# Patient Record
Sex: Female | Born: 1955 | Race: White | Hispanic: No | Marital: Married | Smoking: Never smoker
Health system: Southern US, Community
[De-identification: ages and names within clinical notes are randomized; demographics above are authoritative.]

## PROBLEM LIST (undated history)

## (undated) DIAGNOSIS — Z8639 Personal history of other endocrine, nutritional and metabolic disease: Secondary | ICD-10-CM

## (undated) DIAGNOSIS — I10 Essential (primary) hypertension: Secondary | ICD-10-CM

## (undated) DIAGNOSIS — Z862 Personal history of diseases of the blood and blood-forming organs and certain disorders involving the immune mechanism: Secondary | ICD-10-CM

## (undated) DIAGNOSIS — J45909 Unspecified asthma, uncomplicated: Secondary | ICD-10-CM

## (undated) DIAGNOSIS — E559 Vitamin D deficiency, unspecified: Secondary | ICD-10-CM

## (undated) DIAGNOSIS — J309 Allergic rhinitis, unspecified: Secondary | ICD-10-CM

## (undated) DIAGNOSIS — T7840XA Allergy, unspecified, initial encounter: Secondary | ICD-10-CM

## (undated) DIAGNOSIS — J019 Acute sinusitis, unspecified: Secondary | ICD-10-CM

## (undated) DIAGNOSIS — M8580 Other specified disorders of bone density and structure, unspecified site: Secondary | ICD-10-CM

## (undated) DIAGNOSIS — E785 Hyperlipidemia, unspecified: Secondary | ICD-10-CM

## (undated) DIAGNOSIS — R519 Headache, unspecified: Secondary | ICD-10-CM

## (undated) DIAGNOSIS — R3129 Other microscopic hematuria: Secondary | ICD-10-CM

## (undated) HISTORY — DX: Vitamin D deficiency, unspecified: E55.9

## (undated) HISTORY — DX: Personal history of diseases of the blood and blood-forming organs and certain disorders involving the immune mechanism: Z86.2

## (undated) HISTORY — DX: Unspecified asthma, uncomplicated: J45.909

## (undated) HISTORY — DX: Allergy, unspecified, initial encounter: T78.40XA

## (undated) HISTORY — PX: DILATION AND CURETTAGE OF UTERUS: SHX78

## (undated) HISTORY — PX: COLONOSCOPY: SHX174

## (undated) HISTORY — DX: Other specified disorders of bone density and structure, unspecified site: M85.80

## (undated) HISTORY — DX: Acute sinusitis, unspecified: J01.90

## (undated) HISTORY — DX: Other microscopic hematuria: R31.29

## (undated) HISTORY — DX: Personal history of other endocrine, nutritional and metabolic disease: Z86.39

## (undated) HISTORY — DX: Hyperlipidemia, unspecified: E78.5

## (undated) HISTORY — DX: Allergic rhinitis, unspecified: J30.9

---

## 2002-04-16 ENCOUNTER — Other Ambulatory Visit: Admission: RE | Admit: 2002-04-16 | Discharge: 2002-04-16 | Payer: Self-pay | Admitting: Obstetrics and Gynecology

## 2002-07-08 ENCOUNTER — Encounter: Payer: Self-pay | Admitting: Obstetrics and Gynecology

## 2002-07-08 ENCOUNTER — Ambulatory Visit (HOSPITAL_COMMUNITY): Admission: RE | Admit: 2002-07-08 | Discharge: 2002-07-08 | Payer: Self-pay | Admitting: Obstetrics and Gynecology

## 2002-09-25 HISTORY — PX: COLONOSCOPY W/ POLYPECTOMY: SHX1380

## 2003-04-21 ENCOUNTER — Ambulatory Visit (HOSPITAL_COMMUNITY): Admission: RE | Admit: 2003-04-21 | Discharge: 2003-04-21 | Payer: Self-pay | Admitting: Obstetrics and Gynecology

## 2003-04-21 ENCOUNTER — Encounter (INDEPENDENT_AMBULATORY_CARE_PROVIDER_SITE_OTHER): Payer: Self-pay | Admitting: *Deleted

## 2003-06-17 ENCOUNTER — Other Ambulatory Visit: Admission: RE | Admit: 2003-06-17 | Discharge: 2003-06-17 | Payer: Self-pay | Admitting: Obstetrics and Gynecology

## 2003-10-07 ENCOUNTER — Ambulatory Visit (HOSPITAL_COMMUNITY): Admission: RE | Admit: 2003-10-07 | Discharge: 2003-10-07 | Payer: Self-pay | Admitting: Obstetrics and Gynecology

## 2004-07-22 ENCOUNTER — Other Ambulatory Visit: Admission: RE | Admit: 2004-07-22 | Discharge: 2004-07-22 | Payer: Self-pay | Admitting: Obstetrics and Gynecology

## 2004-10-17 ENCOUNTER — Ambulatory Visit (HOSPITAL_COMMUNITY): Admission: RE | Admit: 2004-10-17 | Discharge: 2004-10-17 | Payer: Self-pay | Admitting: Family Medicine

## 2006-01-24 ENCOUNTER — Ambulatory Visit (HOSPITAL_COMMUNITY): Admission: RE | Admit: 2006-01-24 | Discharge: 2006-01-24 | Payer: Self-pay | Admitting: Obstetrics and Gynecology

## 2006-02-06 ENCOUNTER — Other Ambulatory Visit: Admission: RE | Admit: 2006-02-06 | Discharge: 2006-02-06 | Payer: Self-pay | Admitting: Obstetrics and Gynecology

## 2006-04-16 ENCOUNTER — Ambulatory Visit: Payer: Self-pay | Admitting: Gastroenterology

## 2006-05-14 ENCOUNTER — Ambulatory Visit: Payer: Self-pay | Admitting: Gastroenterology

## 2006-10-09 LAB — HM COLONOSCOPY

## 2007-01-02 ENCOUNTER — Encounter: Payer: Self-pay | Admitting: Family Medicine

## 2007-01-30 ENCOUNTER — Ambulatory Visit (HOSPITAL_COMMUNITY): Admission: RE | Admit: 2007-01-30 | Discharge: 2007-01-30 | Payer: Self-pay | Admitting: Obstetrics and Gynecology

## 2007-02-15 ENCOUNTER — Ambulatory Visit: Payer: Self-pay | Admitting: Internal Medicine

## 2007-03-12 ENCOUNTER — Ambulatory Visit: Payer: Self-pay | Admitting: Internal Medicine

## 2007-03-27 ENCOUNTER — Other Ambulatory Visit: Admission: RE | Admit: 2007-03-27 | Discharge: 2007-03-27 | Payer: Self-pay | Admitting: Obstetrics and Gynecology

## 2007-06-19 ENCOUNTER — Ambulatory Visit: Payer: Self-pay | Admitting: Internal Medicine

## 2007-11-25 DIAGNOSIS — J309 Allergic rhinitis, unspecified: Secondary | ICD-10-CM

## 2007-11-25 DIAGNOSIS — J45909 Unspecified asthma, uncomplicated: Secondary | ICD-10-CM

## 2007-11-25 HISTORY — DX: Allergic rhinitis, unspecified: J30.9

## 2007-11-29 ENCOUNTER — Encounter: Payer: Self-pay | Admitting: Internal Medicine

## 2008-03-18 ENCOUNTER — Encounter: Payer: Self-pay | Admitting: Internal Medicine

## 2008-05-06 ENCOUNTER — Ambulatory Visit (HOSPITAL_COMMUNITY): Admission: RE | Admit: 2008-05-06 | Discharge: 2008-05-06 | Payer: Self-pay | Admitting: Obstetrics and Gynecology

## 2008-05-13 ENCOUNTER — Encounter: Admission: RE | Admit: 2008-05-13 | Discharge: 2008-05-13 | Payer: Self-pay | Admitting: Obstetrics and Gynecology

## 2008-06-23 ENCOUNTER — Other Ambulatory Visit: Admission: RE | Admit: 2008-06-23 | Discharge: 2008-06-23 | Payer: Self-pay | Admitting: Obstetrics and Gynecology

## 2008-07-06 ENCOUNTER — Encounter: Payer: Self-pay | Admitting: Family Medicine

## 2008-08-24 ENCOUNTER — Encounter: Payer: Self-pay | Admitting: Obstetrics and Gynecology

## 2008-08-24 ENCOUNTER — Ambulatory Visit (HOSPITAL_COMMUNITY): Admission: RE | Admit: 2008-08-24 | Discharge: 2008-08-24 | Payer: Self-pay | Admitting: Obstetrics and Gynecology

## 2008-08-24 HISTORY — PX: LAPAROSCOPIC BILATERAL SALPINGECTOMY: SHX5889

## 2009-01-05 ENCOUNTER — Encounter: Admission: RE | Admit: 2009-01-05 | Discharge: 2009-01-05 | Payer: Self-pay | Admitting: Obstetrics and Gynecology

## 2009-05-06 DIAGNOSIS — Z862 Personal history of diseases of the blood and blood-forming organs and certain disorders involving the immune mechanism: Secondary | ICD-10-CM

## 2009-05-06 DIAGNOSIS — Z8639 Personal history of other endocrine, nutritional and metabolic disease: Secondary | ICD-10-CM

## 2009-07-12 ENCOUNTER — Ambulatory Visit: Payer: Self-pay | Admitting: Family Medicine

## 2009-07-12 DIAGNOSIS — E559 Vitamin D deficiency, unspecified: Secondary | ICD-10-CM | POA: Insufficient documentation

## 2009-07-12 DIAGNOSIS — E785 Hyperlipidemia, unspecified: Secondary | ICD-10-CM | POA: Insufficient documentation

## 2009-07-12 HISTORY — DX: Hyperlipidemia, unspecified: E78.5

## 2009-07-12 HISTORY — DX: Vitamin D deficiency, unspecified: E55.9

## 2009-07-14 ENCOUNTER — Encounter: Admission: RE | Admit: 2009-07-14 | Discharge: 2009-07-14 | Payer: Self-pay | Admitting: Obstetrics and Gynecology

## 2009-07-23 ENCOUNTER — Ambulatory Visit: Payer: Self-pay | Admitting: Family Medicine

## 2009-07-23 LAB — CONVERTED CEMR LAB
Basophils Absolute: 0 10*3/uL (ref 0.0–0.1)
Bilirubin Urine: NEGATIVE
Bilirubin, Direct: 0 mg/dL (ref 0.0–0.3)
CO2: 34 meq/L — ABNORMAL HIGH (ref 19–32)
Calcium: 9.3 mg/dL (ref 8.4–10.5)
Chloride: 101 meq/L (ref 96–112)
Eosinophils Relative: 6.8 % — ABNORMAL HIGH (ref 0.0–5.0)
GFR calc non Af Amer: 92.38 mL/min (ref >60)
Hemoglobin: 13 g/dL (ref 12.0–15.0)
LDL Cholesterol: 103 mg/dL — ABNORMAL HIGH (ref 0–99)
Lymphocytes Relative: 38.6 % (ref 12.0–46.0)
Lymphs Abs: 1.6 10*3/uL (ref 0.7–4.0)
MCV: 89.8 fL (ref 78.0–100.0)
Monocytes Relative: 10.2 % (ref 3.0–12.0)
Neutro Abs: 1.9 10*3/uL (ref 1.4–7.7)
Nitrite: NEGATIVE
Platelets: 297 10*3/uL (ref 150.0–400.0)
Potassium: 3.6 meq/L (ref 3.5–5.1)
Protein, U semiquant: NEGATIVE
RDW: 11.5 % (ref 11.5–14.6)
Specific Gravity, Urine: 1.015
TSH: 2.11 microintl units/mL (ref 0.35–5.50)
Total Bilirubin: 0.7 mg/dL (ref 0.3–1.2)
Total CHOL/HDL Ratio: 3
Triglycerides: 77 mg/dL (ref 0.0–149.0)
Urobilinogen, UA: 0.2
WBC: 4.2 10*3/uL — ABNORMAL LOW (ref 4.5–10.5)
pH: 6

## 2009-07-26 LAB — CONVERTED CEMR LAB: Vit D, 25-Hydroxy: 27 ng/mL — ABNORMAL LOW (ref 30–89)

## 2009-08-03 ENCOUNTER — Ambulatory Visit: Payer: Self-pay | Admitting: Family Medicine

## 2009-12-01 ENCOUNTER — Telehealth: Payer: Self-pay | Admitting: Family Medicine

## 2010-01-13 ENCOUNTER — Ambulatory Visit: Payer: Self-pay | Admitting: Internal Medicine

## 2010-01-13 ENCOUNTER — Telehealth: Payer: Self-pay | Admitting: Internal Medicine

## 2010-01-13 DIAGNOSIS — J019 Acute sinusitis, unspecified: Secondary | ICD-10-CM | POA: Insufficient documentation

## 2010-01-26 ENCOUNTER — Telehealth: Payer: Self-pay | Admitting: Family Medicine

## 2010-02-18 ENCOUNTER — Telehealth: Payer: Self-pay | Admitting: Family Medicine

## 2010-02-18 ENCOUNTER — Ambulatory Visit: Payer: Self-pay | Admitting: Family Medicine

## 2010-04-22 ENCOUNTER — Telehealth: Payer: Self-pay | Admitting: Family Medicine

## 2010-08-04 ENCOUNTER — Ambulatory Visit: Payer: Self-pay | Admitting: Family Medicine

## 2010-09-13 ENCOUNTER — Encounter
Admission: RE | Admit: 2010-09-13 | Discharge: 2010-09-13 | Payer: Self-pay | Source: Home / Self Care | Attending: Obstetrics and Gynecology | Admitting: Obstetrics and Gynecology

## 2010-10-16 ENCOUNTER — Encounter: Payer: Self-pay | Admitting: Obstetrics and Gynecology

## 2010-10-25 NOTE — Progress Notes (Signed)
Summary: Refill HCTZ and Lipitor  Phone Note Call from Patient Call back at Home Phone [phone]   Caller: Patient Call For: Evelena Peat MD Summary of Call: pt  needs new rx [facility]  [facility] liptor 20mg   and hctz 25 mg today please pt is going out of town. Initial call taken by: Heron Sabins,  Jan 26, 2010 10:55 AM  Follow-up for Phone Call        OK to refill both for 6 months. Follow-up by: Evelena Peat MD,  Jan 26, 2010 1:31 PM  Additional Follow-up for Phone Call Additional follow up Details #1::        Rs sent, pt informed,  Additional Follow-up by: Sid Falcon LPN,  Jan 27, 980 1:36 PM    Prescriptions: HYDROCHLOROTHIAZIDE 25 MG  TABS (HYDROCHLOROTHIAZIDE) as needed  #30 x 6   Entered by:   Sid Falcon LPN   Authorized by:   Evelena Peat MD   Signed by:   Sid Falcon LPN on 19/14/7829   Method used:   [facility]  [place] [address]       [address]       [place], [place]  [address]       Ph: [phone] or [phone]       Fax: [phone]   RxID:   [id number] LIPITOR 20 MG TABS (ATORVASTATIN CALCIUM) once daily  #30 x 6   Entered by:   Sid Falcon LPN   Authorized by:   Evelena Peat MD   Signed by:   Sid Falcon LPN on 56/43/3295   Method used:   [facility]        [facility]  [place] [address] [facility]       [address]       [place], [place]  [address]       Ph: [phone] or [phone]       Fax: [phone]   RxID:   [id number]

## 2010-10-25 NOTE — Progress Notes (Signed)
Summary: Pt req antibiotic for cough, stuffy nose, [facility]throat [facility]   Phone Note Call from Patient Call back at [phone]   Caller: spouse- Onalee Hua Summary of Call: Pts husband called and said that his wife is experiencing the following symptoms : coughing, stuffy nose, drainage in throat, sorethroat, phelm. Pt was in a exposed to a person that had been dx with tonsilitis and bronchitus. Pt is req an antibiotic to be [facility] [place]. Pt leaving to go out of town and will not be back for 2 wks.  Initial call taken by: Lucy Antigua,  April 22, 2010 1:41 PM  Follow-up for Phone Call        OK to call in Zithromax (Zpack) use as directed. Follow-up by: Evelena Peat MD,  April 22, 2010 3:56 PM  Additional Follow-up for Phone Call Additional follow up Details #1::        Husband informed, he states she has taken Z-pack before with no adverse reaction Additional Follow-up by: Sid Falcon LPN,  April 22, 2010 4:36 PM    New/Updated Medications: ZITHROMAX 1 GM PACK (AZITHROMYCIN)  ZITHROMAX 1 GM PACK (AZITHROMYCIN) as directed Prescriptions: ZITHROMAX 1 GM PACK (AZITHROMYCIN) as directed  #6 x 0   Entered by:   Sid Falcon LPN   Authorized by:   Evelena Peat MD   Signed by:   Sid Falcon LPN on 69/67/8938   Method used:   [facility]        [facility]  [place] [address] [facility]       [address]       [place], [place]  [address]       Ph: [phone] or [phone]       Fax: [phone]   RxID:   [id number]

## 2010-10-25 NOTE — Progress Notes (Signed)
Summary: Pt is req to be tested for lyme disease - 2 deer tick bites  Phone Note Call from Patient Call back at Home Phone [phone]   Caller: Patient Summary of Call: Pt was biten by 2 deer tick in [place] on May 2 and May 6, pt has a hard knot where ticks were attached and now has sore throat and headaches. Pt does not have any joint pain or rash.  Pt is req to get tested for lyme disease.   Initial call taken by: Lucy Antigua,  Feb 18, 2010 2:52 PM  Follow-up for Phone Call        REcommend office follow up and will assess and can draw labs then. Follow-up by: Evelena Peat MD,  Feb 18, 2010 3:16 PM  Additional Follow-up for Phone Call Additional follow up Details #1::        I called pt and sch her for ov for 02/18/10 at 4:15, as noted above.  Additional Follow-up by: Lucy Antigua,  Feb 18, 2010 4:29 PM

## 2010-10-25 NOTE — Progress Notes (Signed)
Summary: sinus  Phone Note Call from Patient   Caller: Spouse Call For: Evelena Peat MD Summary of Call: Husband called to see if pt could be seen today for sinus symptoms.  Attempted to call back but no answer and no voice mail. [phone] Initial call taken by: Lynann Beaver CMA,  January 13, 2010 8:59 AM  Follow-up for Phone Call        seen in office Follow-up by: Birdie Sons MD,  January 13, 2010 12:35 PM

## 2010-10-25 NOTE — Assessment & Plan Note (Signed)
Summary: ?sinus inf/congestion/cjr   Vital Signs:  Patient profile:   55 year old female Menstrual status:  postmenopausal Temp:     98.4 degrees F oral Pulse rate:   62 / minute Pulse rhythm:   regular Resp:     12 per minute BP sitting:   128 / 86  (left arm) Cuff size:   regular  Vitals Entered By: Gladis Riffle, RN (January 13, 2010 10:38 AM) CC: c/o sinuses with green drainage, now has constant headache Is Patient Diabetic? No   CC:  c/o sinuses with green drainage and now has constant headache.  History of Present Illness: URI sxs---ongoing for weeks past two days developed right sided headache associated nasal congestion with mucopurulent mucous no fevers. chills  All other systems reviewed and were negative   Preventive Screening-Counseling & Management  Alcohol-Tobacco     Smoking Status: never  Current Problems (verified): 1)  Routine General Medical Exam@[facility]  (ICD-V70.0) 2)  Vitamin D Deficiency  (ICD-268.9) 3)  Hyperlipidemia  (ICD-272.4) 4)  Hypoglycemia, Hx of  (ICD-V12.2) 5)  Asthma  (ICD-493.90) 6)  Allergic Rhinitis  (ICD-477.9)  Current Medications (verified): 1)  Nasacort Aq 55 Mcg/act  Aers (Triamcinolone Acetonide(Nasal)) .... Use As Directed 2)  Symbicort 80-4.5 Mcg/act  Aero (Budesonide-Formoterol Fumarate) .... Inhale 2 Puffs Two Times A Day 3)  Singulair 10 Mg  Tabs (Montelukast Sodium) .... Take 1 Tablet By Mouth Once A Day 4)  Eq Chlortabs 4 Mg  Tabs (Chlorpheniramine Maleate) .... As Needed 5)  Proventil Hfa 108 (90 Base) Mcg/act  Aers (Albuterol Sulfate) .... Inhale 2 Puffs Every 4 Hours As Needed 6)  Hydrochlorothiazide 25 Mg  Tabs (Hydrochlorothiazide) .... As Needed 7)  Lipitor 20 Mg Tabs (Atorvastatin Calcium) .... Once Daily 8)  Vitamin D (Ergocalciferol) 50000 Unit Caps (Ergocalciferol) .... Once Weekly 9)  Vitamin D3 1000 Unit Tabs (Cholecalciferol) .... 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   Head:  normocephalic and atraumatic.   Eyes:  pupils equal and pupils round.   Ears:  R ear normal and L ear normal.   Nose:  nasal dischargemucosal pallor.   Neck:  No deformities, masses, or tenderness noted.   Impression & Recommendations:  Problem # 1:  SINUSITIS- ACUTE-NOS (ICD-461.9) discussed emperic ABX  side efects discussed Her updated medication list for this problem includes:    Nasacort Aq 55 Mcg/act Aers (Triamcinolone acetonide(nasal)) ..... Use as directed    Doxycycline Hyclate 100 Mg Caps (Doxycycline hyclate) .Marland Kitchen... Take 1 tab twice a day  Complete Medication List: 1)  Nasacort Aq 55 Mcg/act Aers (Triamcinolone acetonide(nasal)) .... Use as directed 2)  Symbicort 80-4.5 Mcg/act Aero (Budesonide-formoterol fumarate) .... Inhale 2 puffs two times a day 3)  Singulair 10 Mg Tabs (Montelukast sodium) .... Take 1 tablet by mouth once a day 4)  Eq Chlortabs 4 Mg Tabs (Chlorpheniramine maleate) .... As needed 5)  Proventil Hfa 108 (90 Base) Mcg/act  Aers (Albuterol sulfate) .... Inhale 2 puffs every 4 hours as needed 6)  Hydrochlorothiazide 25 Mg Tabs (Hydrochlorothiazide) .... As needed 7)  Lipitor 20 Mg Tabs (Atorvastatin calcium) .... Once daily 8)  Vitamin D (ergocalciferol) 50000 Unit Caps (Ergocalciferol) .... Once weekly 9)  Vitamin D3 1000  Unit Tabs (Cholecalciferol) .... Once daily 10)  Doxycycline Hyclate 100 Mg Caps (Doxycycline hyclate) .... Take 1 tab twice a day Prescriptions: DOXYCYCLINE HYCLATE 100 MG CAPS (DOXYCYCLINE HYCLATE) Take 1 tab twice a day  #20 x 0   Entered and Authorized by:   Birdie Sons MD   Signed by:   Birdie Sons MD on 01/13/2010   Method used:   [facility]        [facility]  [place] [address] [facility]       [address]       [place], [place]  [address]       Ph: [phone] or [phone]       Fax: [phone]   RxID:   [id number]

## 2010-10-25 NOTE — Assessment & Plan Note (Signed)
Summary: sinuses//ccm   Vital Signs:  Patient profile:   55 year old female Menstrual status:  postmenopausal Weight:      143 pounds Temp:     99.0 degrees F oral BP sitting:   140 / 88  (left arm) Cuff size:   regular  Vitals Entered By: Sid Falcon LPN (August 04, 2010 9:59 AM)  History of Present Illness: Three-week history of intermittent symptoms of nasal congestion and facial pain. Cough productive of green sputum and occasional blood-tinged sputum. No pleuritic pain, weight loss, or dyspnea. Mucinex D without relief. Similar symptoms in past with acute sinusitis. Nonsmoker  History of asthma generally well controlled. Needs refills medications. Also his history of perennial allergies. She takes Nasacort and Singulair regularly.   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    Physical Exam  General:  Well-developed,well-nourished,in no acute distress; alert,appropriate and cooperative throughout examination Eyes:  No corneal or conjunctival inflammation noted. EOMI. Perrla. Funduscopic exam benign, without  hemorrhages, exudates or papilledema. Vision grossly normal. Ears:  External ear exam shows no significant lesions or deformities.  Otoscopic examination reveals clear canals, tympanic membranes are intact bilaterally without bulging, retraction, inflammation or discharge. Hearing is grossly normal bilaterally. Mouth:  Oral mucosa and oropharynx without lesions or exudates.  Teeth in good repair. Neck:  No deformities, masses, or tenderness noted. Lungs:  Normal respiratory effort, chest expands symmetrically. Lungs are clear to auscultation, no crackles or wheezes. Heart:  Normal rate and regular rhythm. S1 and S2 normal without gallop, murmur, click, rub or other extra sounds. Extremities:  No clubbing, cyanosis, edema, or deformity noted with normal full range of motion of all joints.   Skin:  no rashes.     Impression & Recommendations:  Problem # 1:  SINUSITIS- ACUTE-NOS (ICD-461.9)  The following medications were removed from the medication list:    Doxycycline Hyclate 100 Mg Caps (Doxycycline hyclate) ..... One by mouth two times a day for 14 days Her updated medication list for this problem includes:    Nasacort Aq 55 Mcg/act Aers (Triamcinolone acetonide(nasal)) ..... Use as directed    Azithromycin 250 Mg Tabs (Azithromycin) .Marland Kitchen... 2 by mouth today then one by mouth once daily for 4 days  Problem # 2:  ASTHMA (ICD-493.90) flu vaccine recommended.  Multiple meds refilled. Her updated medication list for this problem includes:    Symbicort 80-4.5 Mcg/act Aero (Budesonide-formoterol fumarate) ..... Inhale 2 puffs two times a day    Singulair 10 Mg Tabs (Montelukast sodium) .Marland Kitchen... Take 1 tablet by mouth once a day    Proventil Hfa 108 (90 Base) Mcg/act Aers (Albuterol sulfate) ..... Inhale 2 puffs every 4 hours as needed  Problem # 3:  ALLERGIC RHINITIS (ICD-477.9)  Her updated medication list for this problem includes:    Nasacort  Aq 55 Mcg/act Aers (Triamcinolone acetonide(nasal))  ..... Use as directed    Eq Chlortabs 4 Mg Tabs (Chlorpheniramine maleate) .Marland Kitchen... As needed  Complete Medication List: 1)  Nasacort Aq 55 Mcg/act Aers (Triamcinolone acetonide(nasal)) .... Use as directed 2)  Symbicort 80-4.5 Mcg/act Aero (Budesonide-formoterol fumarate) .... Inhale 2 puffs two times a day 3)  Singulair 10 Mg Tabs (Montelukast sodium) .... Take 1 tablet by mouth once a day 4)  Eq Chlortabs 4 Mg Tabs (Chlorpheniramine maleate) .... As needed 5)  Proventil Hfa 108 (90 Base) Mcg/act Aers (Albuterol sulfate) .... Inhale 2 puffs every 4 hours as needed 6)  Hydrochlorothiazide 25 Mg Tabs (Hydrochlorothiazide) .... As needed 7)  Lipitor 20 Mg Tabs (Atorvastatin calcium) .... Once daily 8)  Vitamin D (ergocalciferol) 50000 Unit Caps (Ergocalciferol) .... Once weekly 9)  Vitamin D3 1000 Unit Tabs (Cholecalciferol) .... Once daily 10)  Azithromycin 250 Mg Tabs (Azithromycin) .... 2 by mouth today then one by mouth once daily for 4 days  Other Orders: Admin 1st Vaccine (65784) Flu Vaccine 100yrs + (69629)  Patient Instructions: 1)  Consider schedule complete physical examination Prescriptions: AZITHROMYCIN 250 MG TABS (AZITHROMYCIN) 2 by mouth today then one by mouth once daily for 4 days  #6 x 0   Entered and Authorized by:   Evelena Peat MD   Signed by:   Evelena Peat MD on 08/04/2010   Method used:   [facility]        [facility]  [place] [address]       [address]       [place], [place]  [address]       Ph: [phone] or [phone]       Fax: [phone]   RxID:   [id number] VITAMIN D (ERGOCALCIFEROL) 50000 UNIT CAPS (ERGOCALCIFEROL) once weekly  #4 x 11   Entered and Authorized by:   Evelena Peat MD   Signed by:   Evelena Peat MD on 08/04/2010   Method used:   [facility]        [facility]  [place] [address] [facility]       [address]       [place], [place]  [address]       Ph: [phone] or [phone]       Fax: [phone]   RxID:    [id number] PROVENTIL HFA 108 (90 BASE) MCG/ACT  AERS (ALBUTEROL SULFATE) inhale 2 puffs every 4 hours as needed  #1 x 2   Entered and Authorized by:   Evelena Peat MD   Signed by:   Evelena Peat MD on 08/04/2010   Method used:   [facility]        [facility]  [place] [address] [facility]       [address]       [place], [place]  [address]       Ph: [phone] or [phone]       Fax: [phone]   RxID:   [id number] SINGULAIR 10 MG  TABS (MONTELUKAST SODIUM) Take 1 tablet by mouth once a day  #30 x 11   Entered and Authorized by:   Evelena Peat MD   Signed by:   Evelena Peat MD on 08/04/2010   Method used:   [facility]        [facility]  [place] [phone][facility] ([facility]ail)       [address]       [place],  [place]  [address]       Ph: [phone] or [phone]       Fax: [phone]   RxID:   [id number] SYMBICORT 80-4.5 MCG/ACT  AERO (BUDESONIDE-FORMOTEROL FUMARATE) Inhale 2 puffs two times a day  #1 x 11   Entered and Authorized by:   Evelena Peat MD   Signed by:   Evelena Peat MD on 08/04/2010   Method used:   [facility]        [facility]  [place] [address]       [address]       [place], [place]  [address]       Ph: [phone] or [phone]       Fax: [phone]   RxID:   [id number] NASACORT AQ 55 MCG/ACT  AERS (TRIAMCINOLONE ACETONIDE(NASAL)) Use as directed  #1 x 11   Entered and Authorized by:   Evelena Peat MD   Signed by:   Evelena Peat MD on 08/04/2010   Method used:   [facility]        [facility]  [place] [address] [facility]       [address]       [place], [place]  [address]       Ph: [phone] or [phone]       Fax: [phone]   RxID:   [id number]    Orders Added: 1)  Admin 1st Vaccine [90471] 2)  Flu Vaccine 36yrs + [35329] 3)  Est. Patient Level IV [92426]   Flu Vaccine Consent Questions     Do you have a history of severe allergic reactions to this vaccine? no    Any prior history of  allergic reactions to egg and/or gelatin? no    Do you have a sensitivity to the preservative Thimersol? no    Do you have a past history of Guillan-Barre Syndrome? no    Do you currently have an acute febrile illness? no    Have you ever had a severe reaction to latex? no    Vaccine information given and explained to patient? yes    Are you currently pregnant? no    Lot Number:[id number]   Exp Date:03/25/2011   Site Given  Left Deltoid IM         .[email or web address]

## 2010-10-25 NOTE — Progress Notes (Signed)
Summary: trig 521  Phone Note Call from Patient Call back at Home Phone [phone] Call back at [facility]   Summary of Call: At husband's employer health check triglycerides was 521, did have coffee with half and half.  At [facility] she was told her labs were great on Lipitor 20mg .  She is very concerned & wants to know if you need to see her or have labs or what?  Initial call taken by: Rudy Jew, RN,  December 01, 2009 3:34 PM  Follow-up for Phone Call        spoke with pt.  Pt will schedule f/u when she is back in town to consider repeat fasting labs.  No prior hx of hypertriglyceridemia. Follow-up by: Evelena Peat MD,  December 01, 2009 5:16 PM

## 2010-10-25 NOTE — Assessment & Plan Note (Signed)
Summary: tick bites/check for lyme disease per Dr. Charlton Amor   Vital Signs:  Patient profile:   55 year old female Menstrual status:  postmenopausal Temp:     98.5 degrees F oral BP sitting:   120 / 78  (left arm) Cuff size:   regular  Vitals Entered By: Sid Falcon LPN (Feb 18, 2010 4:26 PM) CC: Tick bite?   History of Present Illness: Patient is seen with a couple of recent tick bites.  She was up in [place] and on May 2 removed a tick from left rib cage area. Then approximately 4 days later removed a second tic from under her left axillary region. These were small probable deer ticks. She has not had any rash, headache, stiff neck, malaise, arthralgias, or any other new symptoms.  Allergies: 1)  Biaxin 2)  Erythromycin  Past History:  Past Medical History: Last updated: 08/03/2009  Allergic rhinitis Asthma Frequent headaches Hyperlipidemia Hx low Vit D PMH reviewed for relevance  Review of Systems      See HPI  Physical Exam  General:  Well-developed,well-nourished,in no acute distress; alert,appropriate and cooperative throughout examination Head:  Normocephalic and atraumatic without obvious abnormalities. No apparent alopecia or balding. Lungs:  Normal respiratory effort, chest expands symmetrically. Lungs are clear to auscultation, no crackles or wheezes. Heart:  normal rate and regular rhythm.   Skin:  patient has a couple of small punctate erythematous papules one below the left axilla other left rib cage area anteriorly. No signs of erythema chronicum migrans.   Impression & Recommendations:  Problem # 1:  TICK BITE (ICD-E906.4) Assessment New Endemic area but no ECM or other worrisome symptoms.  At this point we have discussed limitations of antibody testing acutely and no antibiotics at this point.  Reviewed signs and symptoms of Lyme disease.  Only start antibiotic over weekend if new rash, fever, etc.  Complete Medication List: 1)  Nasacort  Aq 55 Mcg/act Aers (Triamcinolone acetonide(nasal)) .... Use as directed 2)  Symbicort 80-4.5 Mcg/act Aero (Budesonide-formoterol fumarate) .... Inhale 2 puffs two times a day 3)  Singulair 10 Mg Tabs (Montelukast sodium) .... Take 1 tablet by mouth once a day 4)  Eq Chlortabs 4 Mg Tabs (Chlorpheniramine maleate) .... As needed 5)  Proventil Hfa 108 (90 Base) Mcg/act Aers (Albuterol sulfate) .... Inhale 2 puffs every 4 hours as needed 6)  Hydrochlorothiazide 25 Mg Tabs (Hydrochlorothiazide) .... As needed 7)  Lipitor 20 Mg Tabs (Atorvastatin calcium) .... Once daily 8)  Vitamin D (ergocalciferol) 50000 Unit Caps (Ergocalciferol) .... Once weekly 9)  Vitamin D3 1000 Unit Tabs (Cholecalciferol) .... Once daily 10)  Doxycycline Hyclate 100 Mg Caps (Doxycycline hyclate) .... One by mouth two times a day for 14 days  Patient Instructions: 1)  Start antibiotic if you develop rash (as discussed), unexplained fever,  headaches, or any joint pains. Prescriptions: DOXYCYCLINE HYCLATE 100 MG CAPS (DOXYCYCLINE HYCLATE) one by mouth two times a day for 14 days  #28 x 0   Entered and Authorized by:   Evelena Peat MD   Signed by:   Evelena Peat MD on 02/18/2010   Method used:   Print then Give to Patient   RxID:   [id number]

## 2010-10-27 ENCOUNTER — Other Ambulatory Visit: Payer: Self-pay | Admitting: *Deleted

## 2010-10-27 MED ORDER — MONTELUKAST SODIUM 10 MG PO TABS: 10.0000 mg | ORAL_TABLET | Freq: Every day | ORAL | Status: DC

## 2010-11-21 ENCOUNTER — Ambulatory Visit (INDEPENDENT_AMBULATORY_CARE_PROVIDER_SITE_OTHER): Payer: BC Managed Care – PPO | Admitting: Family Medicine

## 2010-11-21 ENCOUNTER — Encounter: Payer: Self-pay | Admitting: Family Medicine

## 2010-11-21 VITALS — BP 150/82 | Temp 98.8°F | Ht 63.0 in | Wt 143.0 lb

## 2010-11-21 DIAGNOSIS — J019 Acute sinusitis, unspecified: Secondary | ICD-10-CM

## 2010-11-21 MED ORDER — AMOXICILLIN-POT CLAVULANATE 875-125 MG PO TABS: 1.0000 | ORAL_TABLET | Freq: Two times a day (BID) | ORAL | Status: AC

## 2010-11-21 NOTE — Progress Notes (Signed)
Patient seen with also noted sinus congestive symptoms since January. Symptoms wax and wane. Frequent frontal sinus headaches and some yellowish nasal discharge intermittently. Malaise off and on. Denies any fever. Saline irrigation and antihistamines without improvement. Rare dry cough. No dyspnea. Intermittent mild sore throat. Patient is a nonsmoker. She has history of intolerance to clarithromycin and erythromycin. No penicillin allergy.   Past medical history significant for vitamin D deficiency, mild hyperlipidemia, and mild intermittent asthma   Review of systems -as per history of present illness.  Physical examination patient is alert nontoxic in appearance  nasal exam reveals some erythematous mucosa with clear mucus.  Frontal sinuses are tender to palpation TMs are normal Neck is supple no adenopathy Oropharynx is moist and clear Chest clear auscultation Heart regular rhythm and rate without murmur   probable acute sinusitis given duration of symptoms. Start Augmentin 875 mg twice a day for 10 days and reviewed possible side effects. Followup when necessary

## 2010-11-21 NOTE — Patient Instructions (Signed)

## 2010-12-02 ENCOUNTER — Telehealth: Payer: Self-pay | Admitting: Family Medicine

## 2010-12-02 NOTE — Telephone Encounter (Signed)
Pt informed on personally identified VM 

## 2010-12-02 NOTE — Telephone Encounter (Signed)
Residual cough is common.  If no fever, I would rec observe for now .

## 2010-12-02 NOTE — Telephone Encounter (Signed)
Please advise 

## 2010-12-02 NOTE — Telephone Encounter (Signed)
Triage vm------was seen with a sinus infection. Finished her 10 day abx. She is still coughing up mucus, with congestion. Please advise of something to take? Will be travelling for the next 8 weeks, only being here 1 day a week.   Call her cell- [phone].

## 2011-01-20 ENCOUNTER — Ambulatory Visit (INDEPENDENT_AMBULATORY_CARE_PROVIDER_SITE_OTHER): Payer: BC Managed Care – PPO | Admitting: Family Medicine

## 2011-01-20 ENCOUNTER — Encounter: Payer: Self-pay | Admitting: Family Medicine

## 2011-01-20 VITALS — BP 100/62 | Temp 98.6°F | Wt 143.0 lb

## 2011-01-20 DIAGNOSIS — M79603 Pain in arm, unspecified: Secondary | ICD-10-CM

## 2011-01-20 DIAGNOSIS — M79609 Pain in unspecified limb: Secondary | ICD-10-CM

## 2011-01-20 MED ORDER — NAPROXEN 500 MG PO TABS: 500.0000 mg | ORAL_TABLET | Freq: Two times a day (BID) | ORAL | Status: DC

## 2011-01-20 NOTE — Progress Notes (Signed)
  Subjective:    Patient ID: Bridget Flynn, female    DOB: 02-28-1956, 55 y.o.   MRN: [id number]  HPI Patient seen with right upper extremity pain and pain around the right trapezius and right shoulder region. Onset about one month ago and progressive. Especially bothersome last light. No specific injury. Pain is sharp and worse with movements such as abduction and reaching overhead. She had some tingling sensation in her hand last night and pain that extended from the right lower neck region all the way down to the hand at times. Took some naproxen from leftover prescription which seemed to help. Pain is much better today. No weakness noted. No history of cervical disc problem. No history of rotator cuff problem.   Review of Systems  Cardiovascular: Negative for chest pain.  Musculoskeletal: Negative for myalgias, back pain and joint swelling.  Skin: Negative for rash.  Neurological: Negative for weakness.       Objective:   Physical Exam  Constitutional: She is oriented to person, place, and time. She appears well-developed and well-nourished.  Cardiovascular: Normal rate, regular rhythm and normal heart sounds.   No murmur heard. Pulmonary/Chest: Effort normal and breath sounds normal. No respiratory distress. She has no wheezes. She has no rales.  Musculoskeletal:       Neck reveals full range of motion. She has full range of motion right shoulder passively but does have pain with internal rotation and abduction. No specific point tenderness of the right trapezius. Strength testing of shoulder difficult secondary to pain. No bicipital tenderness. No a.c. joint tenderness.  Neurological: She is alert and oriented to person, place, and time.       Full-strength upper extremity. Normal sensory to touch. 1+ reflex brachioradialis, triceps, and biceps bilaterally          Assessment & Plan:  Right upper extremity and shoulder pain. Differential is rotator cuff versus cervical disc and  suspect the latter. Naproxen 500 mg twice a day with food and touch base 2 weeks if not improving.

## 2011-01-20 NOTE — Patient Instructions (Signed)
Follow up promptly for any progressive weakness, numbness, or pain. Take Naprosyn with food.

## 2011-01-22 ENCOUNTER — Encounter: Payer: Self-pay | Admitting: Family Medicine
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
## 2011-02-07 NOTE — Assessment & Plan Note (Signed)
 [facility]                             PULMONARY OFFICE NOTE   ITALI, MCKENDRY                          MRN:          [id number]  DATE:06/19/2007                            DOB:          1956-03-22    PROBLEM LIST:  1. Allergic rhinitis.  2. Asthma.   HISTORY:  She continues to feel very well-controlled.  She had noted  some mild upper chest congestion as fall weather came in, but it does  not seem to be disturbing her at all.  She is aware of a left anterior  cervical node which has been present for a couple of weeks.  Pending a  physical exam, she has had blood work at [employer].  No significant nasal discharge.  No fever or sore throat.   MEDICATIONS:  1. Nasacort AQ.  2. Symbicort 80/4.5 two puffs b.i.d.  3. Singulair 10 mg.  4. Proventil HFA inhaler.   ALLERGIES:  BIAXIN AND ERYTHROMYCIN for GI upset, but can tolerate Z-  pack p.r.n.   OBJECTIVE:  VITAL SIGNS:  Weight 132 pounds.  BP 122/80, pulse 68, room  air saturation 98%.  HEENT:  I do feel a left anterior cervical node that feels rubbery.  Minor sniffing.  Pharynx seems clear.  CHEST:  Clear.  HEART:  Normal heart sounds.   IMPRESSION:  Asthma well-controlled with Symbicort.  Cervical node is  probably viral triggered, but it needs to be watched.  No changes needed  for now.   PLAN:  Return in 6 months, then probably p.r.n.     Clinton D. Maple Hudson, MD, Tonny Bollman, FACP  Electronically Signed    CDY/MedQ  DD: 06/19/2007  DT: 06/20/2007  Job #: [id number]

## 2011-02-07 NOTE — Op Note (Signed)
Bridget Flynn, Bridget Flynn                   ACCOUNT NO.:  [id number]   MEDICAL RECORD NO.:  [id number]          PATIENT TYPE:  AMB   LOCATION:  [facility]                           FACILITY:  [facility]   PHYSICIAN:  Cynthia P. Romine, M.D.DATE OF BIRTH:  06/07/1956   DATE OF PROCEDURE:  08/24/2008  DATE OF DISCHARGE:                               OPERATIVE REPORT   PREOPERATIVE DIAGNOSIS:  A 3-cm complex right ovarian cyst.   POSTOPERATIVE DIAGNOSIS:  Normal tubes and ovaries, pathology pending.   PROCEDURE:  Laparoscopic bilateral salpingo-oophorectomy.   SURGEON:  Cynthia P. Romine, MD   ANESTHESIA:  General endotracheal.   ESTIMATED BLOOD LOSS:  Minimal.   COMPLICATIONS:  None.   FINDINGS:  On laparoscopy, the right ovarian cyst was not present.  The  right ovary looked normal as did the right tube.  The uterus was small  and mobile and a normal size and shape.  The left tube and ovary  likewise were normal.  A discussion had been had with the patient prior  to surgery that if the right ovarian cyst had resolved, she still wanted  to have a bilateral salpingo-oophorectomy because of her family history  of ovarian cancer and her concern over getting cancer of the ovary  herself.   PROCEDURE:  The patient was taken to the operating room, and after  induction of adequate general endotracheal anesthesia was placed in the  dorsal lithotomy position and prepped and draped in usual fashion.  An  Acorn uterine manipulator was placed and the bladder was drained with a  red rubber catheter.  The legs were then put in a low-lithotomy  position.  Attention was turned to the umbilicus.  Below the umbilicus  was infiltrated with 0.25% plain Marcaine and a vertical incision was  made just below the umbilicus.  It was carried down to the fascia with  blunt dissection.  The fascia was grasped with Kochers, opened with Mayo  scissors, and a pursestring suture of 0 Vicryl was placed around the  fascial opening.   The peritoneum was entered atraumatically.  A Hasson  cannula was then placed.  Pneumoperitoneum was created using the  automatic insufflator with CO2.  Two 5-mm trocars were then inserted  into the right and left lower quadrants after transilluminating to avoid  bleeders and were placed under direct visualization.  The pelvis was  inspected with the above findings.  The bipolar and then tripolar  instruments were then used to coagulate and cut the pedicles containing  the utero-ovarian ligament and the tube until the infundibulopelvic  ligament was fairly well skeletonized on each side.  The ureters were  clearly identified and were well away from the pedicles.  Two Endoloops  of 0 Vicryl were placed around the infundibulopelvic ligaments on each  side and then the specimens were removed with scissors.  Each specimen  was brought out through the operative scope and sent to pathology  separately.  The pedicles were irrigated and were hemostatic.  The  ureters were again identified peristalsing bilaterally.  The 5-mm ports  were removed under direct visualization.  The scope was withdrawn.  Pneumoperitoneum was allowed to escape and the trocar sleeve was  removed.  The fascia was closed by tying the pursestring suture that had  been previously placed.  The skin at the umbilicus was closed using a  4-0 Vicryl Rapide subcuticular stitch and then reinforcing it with  Dermabond.  The two 5-mm incisions were closed with Dermabond.  The  instruments were removed from the vagina and the procedure was  terminated.  The patient tolerated well, went in satisfactory condition  to postanesthesia recovery.      Cynthia P. Romine, M.D.  Electronically Signed     CPR/MEDQ  D:  08/24/2008  T:  08/25/2008  Job:  [id number]

## 2011-02-10 NOTE — Op Note (Signed)
   NAME:  Bridget Flynn, Bridget Flynn                             ACCOUNT NO.:  [id number]   MEDICAL RECORD NO.:  [id number]                   PATIENT TYPE:  AMB   LOCATION:  [facility]                                  FACILITY:  [facility]   PHYSICIAN:  Cynthia P. Romine, M.D.             DATE OF BIRTH:  1955-10-07   DATE OF PROCEDURE:  04/21/2003  DATE OF DISCHARGE:                                 OPERATIVE REPORT   PREOPERATIVE DIAGNOSES:  1. Perimenopausal.  2. Dysfunctional uterine bleeding.  3. Known endometrial polyps.   POSTOPERATIVE DIAGNOSES:  1. Perimenopausal.  2. Dysfunctional uterine bleeding.  3. Known endometrial polyps.  4. Pathology pending.   PROCEDURES:  1. Hysteroscopic resection of endometrial polyps.  2. Dilatation and curettage.   SURGEON:  Cynthia P. Romine, M.D.   ANESTHESIA:  General by LMA.   ESTIMATED BLOOD LOSS:  Minimal.   SORBITOL DEFICIT:  120 mL.   COMPLICATIONS:  None.   PROCEDURE:  The patient was taken to the operating room and after the  induction of adequate general anesthesia by LMA was placed in the dorsal  lithotomy position and prepped and draped in the usual fashion.  The bladder  was drained with a red rubber catheter.  A posterior weighted and anterior  Simms retractor was placed and the cervix was grasped on the anterior lip  with a single-tooth tenaculum.  The uterus sounded to 9 cm.  The cervix was  dilated to a #27 Shawnie Pons.  The diagnostic hysteroscope was introduced.  Hysteroscopy was carried out with visualization of two rather large  endometrial polyps.  The diagnostic scope was removed.  The cervix was  dilated to a #31 Pratt.  The operative hysteroscope was introduced, sorbitol  was used as a distention medium.  Cautery was used to resect these polyps  using a single wire loop.  Photographic documentation was taken before and  after the resection.  Upon  completion of the resection the hysteroscope was withdrawn, sharp curettage  was carried  out, and the specimen was also sent to pathology with the  polyps.  The instruments were removed from the vagina and the procedure was  terminated.  The patient tolerated it well, went in satisfactory condition  to postanesthesia recovery.                                               Cynthia P. Romine, M.D.    CPR/MEDQ  D:  04/21/2003  T:  04/21/2003  Job:  [id number]

## 2011-02-17 ENCOUNTER — Encounter: Payer: Self-pay | Admitting: Internal Medicine

## 2011-02-17 ENCOUNTER — Ambulatory Visit (INDEPENDENT_AMBULATORY_CARE_PROVIDER_SITE_OTHER): Payer: BC Managed Care – PPO | Admitting: Internal Medicine

## 2011-02-17 DIAGNOSIS — J069 Acute upper respiratory infection, unspecified: Secondary | ICD-10-CM

## 2011-02-17 DIAGNOSIS — J45909 Unspecified asthma, uncomplicated: Secondary | ICD-10-CM

## 2011-02-17 DIAGNOSIS — J309 Allergic rhinitis, unspecified: Secondary | ICD-10-CM

## 2011-02-17 MED ORDER — DOXYCYCLINE HYCLATE 50 MG PO CAPS: 50.0000 mg | ORAL_CAPSULE | Freq: Two times a day (BID) | ORAL | Status: AC

## 2011-02-17 MED ORDER — DOXYCYCLINE HYCLATE 50 MG PO CAPS: 50.0000 mg | ORAL_CAPSULE | Freq: Two times a day (BID) | ORAL | Status: DC

## 2011-02-17 NOTE — Patient Instructions (Signed)
Acute bronchitis symptoms for less than 10 days are generally not helped by antibiotics.  Take over-the-counter expectorants and cough medications such as  Mucinex DM.  Call if there is no improvement in 5 to 7 days or if he developed worsening cough, fever, or new symptoms, such as shortness of breath or chest pain.    

## 2011-02-17 NOTE — Progress Notes (Signed)
  Subjective:    Patient ID: Bridget Flynn, female    DOB: November 21, 1955, 55 y.o.   MRN: [id number]  HPI  55 year old patient who has a history of asthma and allergic rhinitis. She presents with a five-day history of increasing hoarseness and for the past 3 days productive cough. She has become more hoarse with head and chest congestion and cough productive of green sputum. She has had low-grade fever only denies any chest pain shortness of breath or active wheezing. She is on number of the maintenance medication for allergic rhinitis and asthma which he continues to take. She has added Mucinex to her regimen;  she was concerned about her purulent sputum production and need for antibiotic use    Review of Systems  Constitutional: Negative.   HENT: Positive for congestion. Negative for hearing loss, sore throat, rhinorrhea, dental problem, sinus pressure and tinnitus.   Eyes: Negative for pain, discharge and visual disturbance.  Respiratory: Positive for cough. Negative for shortness of breath.   Cardiovascular: Negative for chest pain, palpitations and leg swelling.  Gastrointestinal: Negative for nausea, vomiting, abdominal pain, diarrhea, constipation, blood in stool and abdominal distention.  Genitourinary: Negative for dysuria, urgency, frequency, hematuria, flank pain, vaginal bleeding, vaginal discharge, difficulty urinating, vaginal pain and pelvic pain.  Musculoskeletal: Negative for joint swelling, arthralgias and gait problem.  Skin: Negative for rash.  Neurological: Negative for dizziness, syncope, speech difficulty, weakness, numbness and headaches.  Hematological: Negative for adenopathy.  Psychiatric/Behavioral: Negative for behavioral problems, dysphoric mood and agitation. The patient is not nervous/anxious.        Objective:   Physical Exam  Constitutional: She is oriented to person, place, and time. She appears well-developed and well-nourished.       Hoarse; no acute distress.  Blood pressure 130/80  HENT:  Head: Normocephalic.  Right Ear: External ear normal.  Left Ear: External ear normal.  Mouth/Throat: Oropharynx is clear and moist.  Eyes: Conjunctivae and EOM are normal. Pupils are equal, round, and reactive to light.  Neck: Normal range of motion. Neck supple. No thyromegaly present.  Cardiovascular: Normal rate, regular rhythm, normal heart sounds and intact distal pulses.   Pulmonary/Chest: Effort normal and breath sounds normal.  Abdominal: Soft. Bowel sounds are normal. She exhibits no mass. There is no tenderness.  Musculoskeletal: Normal range of motion.  Lymphadenopathy:    She has no cervical adenopathy.  Neurological: She is alert and oriented to person, place, and time.  Skin: Skin is warm and dry. No rash noted.  Psychiatric: She has a normal mood and affect. Her behavior is normal.          Assessment & Plan:   URI. Asthma Allergic rhinitis  It is felt the patient has a viral bronchitis. She was given a prescription for an antibiotic to take only if she becomes clinically worse;  she is aware that this does not appear to be a bacterial problem at this time

## 2011-03-08 ENCOUNTER — Ambulatory Visit (INDEPENDENT_AMBULATORY_CARE_PROVIDER_SITE_OTHER): Payer: BC Managed Care – PPO | Admitting: Family Medicine

## 2011-03-08 ENCOUNTER — Encounter: Payer: Self-pay | Admitting: Family Medicine

## 2011-03-08 DIAGNOSIS — R05 Cough: Secondary | ICD-10-CM

## 2011-03-08 MED ORDER — METHYLPREDNISOLONE ACETATE 80 MG/ML IJ SUSP
80.0000 mg | Freq: Once | INTRAMUSCULAR | Status: AC
  Administered 2011-03-08: 80 mg via INTRAMUSCULAR

## 2011-03-08 NOTE — Progress Notes (Signed)
  Subjective:    Patient ID: Bridget Flynn, female    DOB: 04-29-1956, 55 y.o.   MRN: [id number]  HPI Patient seen with ongoing nasal congestive symptoms for the past month and a half. Suspects allergy related. She's tried chlorpheniramine with some improvement. Also takes Nasacort. She's been on Singulair. She had side effects from Astelin nasal. She has not had any purulent nasal secretions. She has had productive cough with yellow sputum. Recently went back on doxycycline from previous prescription. No fever. Nonsmoker.   Review of Systems  Constitutional: Negative for fever and chills.  HENT: Positive for congestion and sinus pressure.   Respiratory: Positive for cough. Negative for shortness of breath.   Cardiovascular: Negative for chest pain.       Objective:   Physical Exam  Constitutional: She appears well-developed and well-nourished.  HENT:  Head: Normocephalic and atraumatic.  Right Ear: External ear normal.  Left Ear: External ear normal.  Mouth/Throat: Oropharynx is clear and moist. No oropharyngeal exudate.  Neck: Neck supple. No thyromegaly present.  Cardiovascular: Normal rate, regular rhythm and normal heart sounds.   Pulmonary/Chest: Effort normal and breath sounds normal. No respiratory distress. She has no wheezes. She has no rales.  Musculoskeletal: She exhibits no edema.  Lymphadenopathy:    She has no cervical adenopathy.          Assessment & Plan:  Patient presents with allergic rhinitis. She is on multiple agents currently without good control. Agree to Depo-Medrol 80 mg and she is aware we cannot do these very often. Continue with her Singulair, Nasacort, and chlorpheniramine

## 2011-03-10 ENCOUNTER — Telehealth: Payer: Self-pay | Admitting: *Deleted

## 2011-03-10 MED ORDER — HYDROCODONE-HOMATROPINE 5-1.5 MG/5ML PO SYRP: 5.0000 mL | ORAL_SOLUTION | Freq: Four times a day (QID) | ORAL | Status: DC | PRN

## 2011-03-10 NOTE — Telephone Encounter (Signed)
Pt is coughing constantly and is taking her husband's Hydrocodone cough meds and would like to have a prescription called to [facility]).

## 2011-03-10 NOTE — Telephone Encounter (Signed)
Hycodan 120 ml one tsp po q 6 hours prn cough.

## 2011-04-05 ENCOUNTER — Other Ambulatory Visit: Payer: Self-pay | Admitting: Family Medicine

## 2011-06-10 LAB — HM PAP SMEAR: HM Pap smear: NEGATIVE

## 2011-06-27 LAB — CBC
Platelets: 305
RDW: 12.7
WBC: 5.6

## 2011-07-25 ENCOUNTER — Other Ambulatory Visit: Payer: Self-pay | Admitting: Obstetrics and Gynecology

## 2011-07-25 DIAGNOSIS — Z1231 Encounter for screening mammogram for malignant neoplasm of breast: Secondary | ICD-10-CM

## 2011-08-02 ENCOUNTER — Other Ambulatory Visit: Payer: Self-pay | Admitting: Obstetrics and Gynecology

## 2011-08-02 DIAGNOSIS — E559 Vitamin D deficiency, unspecified: Secondary | ICD-10-CM

## 2011-08-02 DIAGNOSIS — Z780 Asymptomatic menopausal state: Secondary | ICD-10-CM

## 2011-09-15 ENCOUNTER — Ambulatory Visit
Admission: RE | Admit: 2011-09-15 | Discharge: 2011-09-15 | Disposition: A | Discharge status: Home / Self Care | Payer: BC Managed Care – PPO | Source: Ambulatory Visit | Attending: Obstetrics and Gynecology | Admitting: Obstetrics and Gynecology

## 2011-09-15 DIAGNOSIS — Z780 Asymptomatic menopausal state: Secondary | ICD-10-CM

## 2011-09-15 DIAGNOSIS — Z1231 Encounter for screening mammogram for malignant neoplasm of breast: Secondary | ICD-10-CM

## 2011-09-15 DIAGNOSIS — E559 Vitamin D deficiency, unspecified: Secondary | ICD-10-CM

## 2011-12-01 ENCOUNTER — Other Ambulatory Visit (INDEPENDENT_AMBULATORY_CARE_PROVIDER_SITE_OTHER): Payer: BC Managed Care – PPO

## 2011-12-01 DIAGNOSIS — Z0000 Encounter for general adult medical examination without abnormal findings: Secondary | ICD-10-CM

## 2011-12-01 LAB — BASIC METABOLIC PANEL
CO2: 34 mEq/L — ABNORMAL HIGH (ref 19–32)
Calcium: 10 mg/dL (ref 8.4–10.5)
Chloride: 99 mEq/L (ref 96–112)
GFR: 79.66 mL/min (ref >60.00)
Potassium: 4.7 mEq/L (ref 3.5–5.1)

## 2011-12-01 LAB — CBC WITH DIFFERENTIAL/PLATELET
Basophils Absolute: 0.1 10*3/uL (ref 0.0–0.1)
Eosinophils Absolute: 0.6 10*3/uL (ref 0.0–0.7)
Eosinophils Relative: 11.9 % — ABNORMAL HIGH (ref 0.0–5.0)
Hemoglobin: 12.7 g/dL (ref 12.0–15.0)
MCHC: 33.2 g/dL (ref 30.0–36.0)
MCV: 87.9 fl (ref 78.0–100.0)
Monocytes Relative: 8.8 % (ref 3.0–12.0)
Neutro Abs: 1.9 10*3/uL (ref 1.4–7.7)
Neutrophils Relative %: 38.4 % — ABNORMAL LOW (ref 43.0–77.0)
Platelets: 314 10*3/uL (ref 150.0–400.0)
RDW: 13.4 % (ref 11.5–14.6)

## 2011-12-01 LAB — LIPID PANEL
Cholesterol: 197 mg/dL (ref 0–200)
LDL Cholesterol: 126 mg/dL — ABNORMAL HIGH (ref 0–99)
Total CHOL/HDL Ratio: 4
Triglycerides: 76 mg/dL (ref 0.0–149.0)
VLDL: 15.2 mg/dL (ref 0.0–40.0)

## 2011-12-01 LAB — HEPATIC FUNCTION PANEL
AST: 24 U/L (ref 0–37)
Albumin: 4.3 g/dL (ref 3.5–5.2)
Bilirubin, Direct: 0 mg/dL (ref 0.0–0.3)
Total Bilirubin: 0.4 mg/dL (ref 0.3–1.2)
Total Protein: 7.3 g/dL (ref 6.0–8.3)

## 2011-12-01 LAB — POCT URINALYSIS DIPSTICK
Ketones, UA: NEGATIVE
Protein, UA: NEGATIVE
Spec Grav, UA: 1.015
Urobilinogen, UA: 0.2

## 2011-12-08 ENCOUNTER — Ambulatory Visit (INDEPENDENT_AMBULATORY_CARE_PROVIDER_SITE_OTHER): Payer: BC Managed Care – PPO | Admitting: Family Medicine

## 2011-12-08 ENCOUNTER — Encounter: Payer: Self-pay | Admitting: Family Medicine

## 2011-12-08 ENCOUNTER — Telehealth: Payer: Self-pay | Admitting: Family Medicine

## 2011-12-08 VITALS — BP 130/96 | HR 80 | Temp 97.8°F | Resp 12 | Ht 63.0 in | Wt 146.0 lb

## 2011-12-08 DIAGNOSIS — J309 Allergic rhinitis, unspecified: Secondary | ICD-10-CM

## 2011-12-08 DIAGNOSIS — E785 Hyperlipidemia, unspecified: Secondary | ICD-10-CM

## 2011-12-08 DIAGNOSIS — M899 Disorder of bone, unspecified: Secondary | ICD-10-CM

## 2011-12-08 DIAGNOSIS — M8580 Other specified disorders of bone density and structure, unspecified site: Secondary | ICD-10-CM

## 2011-12-08 DIAGNOSIS — R319 Hematuria, unspecified: Secondary | ICD-10-CM

## 2011-12-08 DIAGNOSIS — Z299 Encounter for prophylactic measures, unspecified: Secondary | ICD-10-CM

## 2011-12-08 DIAGNOSIS — Z0000 Encounter for general adult medical examination without abnormal findings: Secondary | ICD-10-CM

## 2011-12-08 LAB — POCT URINALYSIS DIPSTICK
Bilirubin, UA: NEGATIVE
Glucose, UA: NEGATIVE
Nitrite, UA: NEGATIVE
Spec Grav, UA: <=1.005
pH, UA: 6.5

## 2011-12-08 MED ORDER — ATORVASTATIN CALCIUM 20 MG PO TABS: 20.0000 mg | ORAL_TABLET | Freq: Every day | ORAL | Status: DC

## 2011-12-08 MED ORDER — PNEUMOCOCCAL VAC POLYVALENT 25 MCG/0.5ML IJ INJ: 0.5000 mL | INJECTION | Freq: Once | INTRAMUSCULAR | Status: DC

## 2011-12-08 MED ORDER — MONTELUKAST SODIUM 10 MG PO TABS: 10.0000 mg | ORAL_TABLET | Freq: Every day | ORAL | Status: DC

## 2011-12-08 NOTE — Telephone Encounter (Signed)
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

## 2011-12-08 NOTE — Progress Notes (Signed)
Subjective:    Patient ID: Bridget Flynn, female    DOB: Jan 30, 1956, 56 y.o.   MRN: [id number]  HPI  Patient seen for complete physical. She continues to see gynecologist. Is getting yearly Pap smears and mammograms from them. Colonoscopy 5 years ago normal. Tetanus is up to date. No prior history of Pneumovax.  Her chronic medical problems history of asthma, hyperlipidemia, allergic rhinitis, and vitamin D deficiency. She had recent osteopenia by DEXA scanning.  Takes calcium and vitamin D regularly.  Family history reviewed. Mother had ovarian cancer. She also had apparently non-alcoholic cirrhosis of uncertain etiology. She has a brother who sees a hematologist and has regular phlebotomy. Question polycythemia versus hemochromatosis. She's not sure of diagnosis.  Past Medical History  Diagnosis Date  . VITAMIN D DEFICIENCY 07/12/2009    Qualifier: Diagnosis of  By: Gabriel Rung LPN, Harriett Sine    . HYPERLIPIDEMIA 07/12/2009    Qualifier: Diagnosis of  By: Gabriel Rung LPN, Harriett Sine    . SINUSITIS- ACUTE-NOS 01/13/2010    Qualifier: Diagnosis of  By: Cato Mulligan MD, Tedric Leeth    . ALLERGIC RHINITIS 11/25/2007    Qualifier: Diagnosis of  By: Yetta Barre CNA/MA, Shanda Bumps    . ASTHMA 11/25/2007    Qualifier: Diagnosis of  By: Yetta Barre CNA/MA, Shanda Bumps    . HYPOGLYCEMIA, HX OF 05/06/2009    Qualifier: Diagnosis of  By: Linna Darner CMA, Arline Asp     Past Surgical History  Procedure Date  . Abdominal hysterectomy   . Colonoscopy w/ polypectomy 2004    reports that she has never smoked. She does not have any smokeless tobacco history on file. She reports that she drinks alcohol. She reports that she does not use illicit drugs. family history includes Cancer in her father, mother, and other and Diabetes in her father, mother, and other. Allergies  Allergen Reactions  . Clarithromycin     REACTION: diarrhea, vomiting  . Erythromycin     REACTION: GI upset      Review of Systems  Constitutional: Negative for fever, activity  change, appetite change, fatigue and unexpected weight change.  HENT: Negative for hearing loss, ear pain, sore throat and trouble swallowing.   Eyes: Negative for visual disturbance.  Respiratory: Negative for cough and shortness of breath.   Cardiovascular: Negative for chest pain and palpitations.  Gastrointestinal: Negative for abdominal pain, diarrhea, constipation and blood in stool.  Genitourinary: Negative for dysuria and hematuria.  Musculoskeletal: Negative for myalgias, back pain and arthralgias.  Skin: Negative for rash.  Neurological: Negative for dizziness, syncope and headaches.  Hematological: Negative for adenopathy.  Psychiatric/Behavioral: Negative for confusion and dysphoric mood.       Objective:   Physical Exam  Constitutional: She is oriented to person, place, and time. She appears well-developed and well-nourished.  HENT:  Head: Normocephalic and atraumatic.  Eyes: EOM are normal. Pupils are equal, round, and reactive to light.  Neck: Normal range of motion. Neck supple. No thyromegaly present.  Cardiovascular: Normal rate, regular rhythm and normal heart sounds.   No murmur heard. Pulmonary/Chest: Breath sounds normal. No respiratory distress. She has no wheezes. She has no rales.  Abdominal: Soft. Bowel sounds are normal. She exhibits no distension and no mass. There is no tenderness. There is no rebound and no guarding.  Genitourinary:       Per gyn  Musculoskeletal: Normal range of motion. She exhibits no edema.  Lymphadenopathy:    She has no cervical adenopathy.  Neurological: She is alert and oriented to  person, place, and time. She displays normal reflexes. No cranial nerve deficit.  Skin: No rash noted.  Psychiatric: She has a normal mood and affect. Her behavior is normal. Judgment and thought content normal.          Assessment & Plan:  Complete physical .  Labs reviewed with patient. Lipids slightly elevated but she has been inconsistent  with the use of Lipitor. She has hematuria on urine dipstick. No urinary symptoms.  If repeat today is still positive consider urology referral.  Family history of some type of blood disorder in brother and mother with nonalcoholic cirrhosis. Find out details to make sure her brother does not have hemochromatosis. If he does have hemochromatosis consider genetic studies and transferrin saturation to rule out evidence for this  Patient called back and states brother has "polycythemia"

## 2011-12-08 NOTE — Patient Instructions (Signed)
Find out more details regarding your brother's blood disorder. Especially make sure he does not have history of hemochromatosis Established regular exercise Continue calcium and vitamin D supplementation

## 2012-02-13 ENCOUNTER — Other Ambulatory Visit: Payer: Self-pay | Admitting: Family Medicine

## 2012-03-04 ENCOUNTER — Encounter: Payer: Self-pay | Admitting: Family Medicine

## 2012-03-04 ENCOUNTER — Ambulatory Visit (INDEPENDENT_AMBULATORY_CARE_PROVIDER_SITE_OTHER): Payer: BC Managed Care – PPO | Admitting: Family Medicine

## 2012-03-04 VITALS — BP 160/84 | Temp 98.0°F | Wt 150.0 lb

## 2012-03-04 DIAGNOSIS — J309 Allergic rhinitis, unspecified: Secondary | ICD-10-CM

## 2012-03-04 DIAGNOSIS — R030 Elevated blood-pressure reading, without diagnosis of hypertension: Secondary | ICD-10-CM

## 2012-03-04 MED ORDER — METHYLPREDNISOLONE ACETATE 80 MG/ML IJ SUSP
80.0000 mg | Freq: Once | INTRAMUSCULAR | Status: AC
  Administered 2012-03-04: 80 mg via INTRAMUSCULAR

## 2012-03-04 NOTE — Progress Notes (Signed)
  Subjective:    Patient ID: Bridget Flynn, female    DOB: 1955/12/31, 56 y.o.   MRN: [id number]  HPI  Patient seen with at least 2 month history of nasal congestion and clear rhinorrhea. Frequent sneezing. Denies any purulent secretions. No fever or chills. No headaches. She is taking Singulair, Nasacort and chlorpheniramine without much relief. She's previously tried Astelin but had increased nasal congestion. Rare cough.  Recently had some fluid retention issues. Takes HCTZ -but not regularly and only for edema issues. No history of hypertension. No headaches. No dizziness.  Past Medical History  Diagnosis Date  . VITAMIN D DEFICIENCY 07/12/2009    Qualifier: Diagnosis of  By: Gabriel Rung LPN, Harriett Sine    . HYPERLIPIDEMIA 07/12/2009    Qualifier: Diagnosis of  By: Gabriel Rung LPN, Harriett Sine    . SINUSITIS- ACUTE-NOS 01/13/2010    Qualifier: Diagnosis of  By: Cato Mulligan MD, Sheralyn Pinegar    . ALLERGIC RHINITIS 11/25/2007    Qualifier: Diagnosis of  By: Yetta Barre CNA/MA, Shanda Bumps    . ASTHMA 11/25/2007    Qualifier: Diagnosis of  By: Yetta Barre CNA/MA, Shanda Bumps    . HYPOGLYCEMIA, HX OF 05/06/2009    Qualifier: Diagnosis of  By: Linna Darner CMA, Arline Asp     Past Surgical History  Procedure Date  . Abdominal hysterectomy   . Colonoscopy w/ polypectomy 2004    reports that she has never smoked. She does not have any smokeless tobacco history on file. She reports that she drinks alcohol. She reports that she does not use illicit drugs. family history includes Cancer in her father, mother, and other and Diabetes in her father, mother, and other. Allergies  Allergen Reactions  . Clarithromycin     REACTION: diarrhea, vomiting  . Erythromycin     REACTION: GI upset     Review of Systems  Constitutional: Negative for fever and chills.  HENT: Positive for congestion, rhinorrhea, sneezing and postnasal drip. Negative for sore throat and voice change.   Respiratory: Negative for cough and shortness of breath.   Cardiovascular:  Negative for chest pain.  Neurological: Negative for headaches.       Objective:   Physical Exam  Constitutional: She appears well-developed and well-nourished.  HENT:  Right Ear: External ear normal.  Left Ear: External ear normal.  Mouth/Throat: Oropharynx is clear and moist.  Neck: Neck supple.  Cardiovascular: Normal rate and regular rhythm.   Pulmonary/Chest: Effort normal and breath sounds normal. No respiratory distress. She has no wheezes. She has no rales.  Musculoskeletal: She exhibits no edema.  Lymphadenopathy:    She has no cervical adenopathy.          Assessment & Plan:  #1 allergic rhinitis. She has already been taking antihistamine along with nasal steroids and Singulair and has been intolerant of nasal antihistamine. Depo-Medrol 80 mg IM. Reviewed possible side effects. Consider addition of another antihistamine if not seeing adequate relief with the above #2 elevated blood pressure. Monitor regularly the next several weeks. Start regular HCTZ blood pressure consistently over 140/90.

## 2012-03-04 NOTE — Patient Instructions (Signed)
Work on weight loss. Monitor blood pressure regularly for the next few weeks Consider starting HCTZ 25 mg one half tablet every day if blood pressure consistently greater than 140/90

## 2012-03-06 ENCOUNTER — Other Ambulatory Visit: Payer: Self-pay | Admitting: *Deleted

## 2012-03-06 MED ORDER — NAPROXEN 500 MG PO TABS: 500.0000 mg | ORAL_TABLET | Freq: Two times a day (BID) | ORAL | Status: DC

## 2012-03-07 ENCOUNTER — Other Ambulatory Visit: Payer: Self-pay | Admitting: Family Medicine

## 2012-06-08 ENCOUNTER — Other Ambulatory Visit: Payer: Self-pay | Admitting: Family Medicine

## 2012-08-17 ENCOUNTER — Other Ambulatory Visit: Payer: Self-pay | Admitting: Family Medicine

## 2012-08-26 ENCOUNTER — Other Ambulatory Visit: Payer: Self-pay | Admitting: Obstetrics and Gynecology

## 2012-08-26 DIAGNOSIS — Z1231 Encounter for screening mammogram for malignant neoplasm of breast: Secondary | ICD-10-CM

## 2012-09-01 ENCOUNTER — Other Ambulatory Visit: Payer: Self-pay | Admitting: Family Medicine

## 2012-09-30 ENCOUNTER — Ambulatory Visit: Payer: BC Managed Care – PPO

## 2013-02-01 ENCOUNTER — Other Ambulatory Visit: Payer: Self-pay | Admitting: Family Medicine

## 2013-03-30 ENCOUNTER — Other Ambulatory Visit: Payer: Self-pay | Admitting: Family Medicine

## 2013-03-31 ENCOUNTER — Other Ambulatory Visit: Payer: Self-pay | Admitting: Obstetrics and Gynecology

## 2013-03-31 ENCOUNTER — Other Ambulatory Visit: Payer: Self-pay

## 2013-03-31 ENCOUNTER — Other Ambulatory Visit: Payer: Self-pay | Admitting: *Deleted

## 2013-03-31 DIAGNOSIS — Z1231 Encounter for screening mammogram for malignant neoplasm of breast: Secondary | ICD-10-CM

## 2013-03-31 MED ORDER — ATORVASTATIN CALCIUM 20 MG PO TABS: 20.0000 mg | ORAL_TABLET | Freq: Every day | ORAL | Status: DC

## 2013-03-31 NOTE — Telephone Encounter (Signed)
Rx request to pharmacy, 30-day supply only; Office Visit Needed Prior to Future Refills/SLS

## 2013-03-31 NOTE — Telephone Encounter (Signed)
Rx request to pharmacy; **Office Visit Needed Prior to Future Refills**/SLS  

## 2013-04-14 ENCOUNTER — Other Ambulatory Visit (INDEPENDENT_AMBULATORY_CARE_PROVIDER_SITE_OTHER): Payer: BC Managed Care – PPO

## 2013-04-14 DIAGNOSIS — Z0000 Encounter for general adult medical examination without abnormal findings: Secondary | ICD-10-CM

## 2013-04-14 LAB — LIPID PANEL
Cholesterol: 226 mg/dL — ABNORMAL HIGH (ref 0–200)
HDL: 37.9 mg/dL — ABNORMAL LOW (ref >39.00)
Triglycerides: 179 mg/dL — ABNORMAL HIGH (ref 0.0–149.0)
VLDL: 35.8 mg/dL (ref 0.0–40.0)

## 2013-04-14 LAB — CBC WITH DIFFERENTIAL/PLATELET
Basophils Absolute: 0 10*3/uL (ref 0.0–0.1)
Eosinophils Relative: 4 % (ref 0.0–5.0)
MCV: 87.9 fl (ref 78.0–100.0)
Monocytes Absolute: 0.6 10*3/uL (ref 0.1–1.0)
Monocytes Relative: 9.7 % (ref 3.0–12.0)
Neutrophils Relative %: 53.8 % (ref 43.0–77.0)
Platelets: 456 10*3/uL — ABNORMAL HIGH (ref 150.0–400.0)
RDW: 13.4 % (ref 11.5–14.6)
WBC: 5.9 10*3/uL (ref 4.5–10.5)

## 2013-04-14 LAB — POCT URINALYSIS DIPSTICK
Bilirubin, UA: NEGATIVE
Glucose, UA: NEGATIVE
Ketones, UA: NEGATIVE
Leukocytes, UA: NEGATIVE
Nitrite, UA: NEGATIVE
pH, UA: 8.5

## 2013-04-14 LAB — BASIC METABOLIC PANEL
BUN: 10 mg/dL (ref 6–23)
Creatinine, Ser: 0.7 mg/dL (ref 0.4–1.2)
GFR: 88.24 mL/min (ref >60.00)
Glucose, Bld: 94 mg/dL (ref 70–99)

## 2013-04-14 LAB — HEPATIC FUNCTION PANEL
ALT: 22 U/L (ref 0–35)
Bilirubin, Direct: 0 mg/dL (ref 0.0–0.3)
Total Bilirubin: 0.5 mg/dL (ref 0.3–1.2)

## 2013-04-15 ENCOUNTER — Ambulatory Visit: Payer: BC Managed Care – PPO | Admitting: Family Medicine

## 2013-04-17 ENCOUNTER — Encounter: Payer: Self-pay | Admitting: Obstetrics and Gynecology

## 2013-04-18 ENCOUNTER — Encounter: Payer: Self-pay | Admitting: Obstetrics and Gynecology

## 2013-04-18 ENCOUNTER — Ambulatory Visit (INDEPENDENT_AMBULATORY_CARE_PROVIDER_SITE_OTHER): Payer: BC Managed Care – PPO | Admitting: Obstetrics and Gynecology

## 2013-04-18 VITALS — BP 120/60 | HR 78 | Resp 18 | Ht 63.25 in | Wt 147.0 lb

## 2013-04-18 DIAGNOSIS — Z01419 Encounter for gynecological examination (general) (routine) without abnormal findings: Secondary | ICD-10-CM

## 2013-04-18 NOTE — Progress Notes (Signed)
57 y.o.   Married    Caucasian   female   [id number]   here for annual exam.    Patient's last menstrual period was 10/26/2005.          Sexually active: yes  The current method of family planning is post menopausal status.    Exercising: walking, cardio, core exercises, Personal Trainer Last mammogram:  09/15/11 neg, appt 04/21/13 Last pap smear: 08/01/11 neg History of abnormal pap: no Smoking: never Alcohol: occ glass of wine Last colonoscopy:2007 normal, repeat in 10years Last Bone Density:  09/15/11 osteopenia spine and hip Last tetanus shot: less than 10 years Last cholesterol check: 04/14/13   Hgb:   pcp             Urine: pcp   Family History  Problem Relation Age of Onset  . Diabetes Other   . Cancer Other     ovarian  . Diabetes Mother   . Cancer Mother     ovarian cancer  . Diabetes Father   . Cancer Father     Hodgkins lymphoma    Patient Active Problem List   Diagnosis Date Noted  . Osteopenia 12/08/2011  . SINUSITIS- ACUTE-NOS 01/13/2010  . VITAMIN D DEFICIENCY 07/12/2009  . HYPERLIPIDEMIA 07/12/2009  . HYPOGLYCEMIA, HX OF 05/06/2009  . ALLERGIC RHINITIS 11/25/2007  . ASTHMA 11/25/2007    Past Medical History  Diagnosis Date  . VITAMIN D DEFICIENCY 07/12/2009    Qualifier: Diagnosis of  By: Gabriel Rung LPN, Harriett Sine    . HYPERLIPIDEMIA 07/12/2009    Qualifier: Diagnosis of  By: Gabriel Rung LPN, Harriett Sine    . SINUSITIS- ACUTE-NOS 01/13/2010    Qualifier: Diagnosis of  By: Cato Mulligan MD, Bruce    . ALLERGIC RHINITIS 11/25/2007    Qualifier: Diagnosis of  By: Yetta Barre CNA/MA, Shanda Bumps    . ASTHMA 11/25/2007    Qualifier: Diagnosis of  By: Yetta Barre CNA/MA, Shanda Bumps    . HYPOGLYCEMIA, HX OF 05/06/2009    Qualifier: Diagnosis of  By: Linna Darner, CMA, Cindy    . Asthma     Past Surgical History  Procedure Laterality Date  . Colonoscopy w/ polypectomy  2004  . Laparoscopic bilateral salpingectomy  08/24/08    right ovarian cyst resolved  . Dilation and curettage of uterus       Allergies: Allegra; Biaxin; Erythromycin; and Sulfa antibiotics  Current Outpatient Prescriptions  Medication Sig Dispense Refill  . albuterol (PROVENTIL HFA) 108 (90 BASE) MCG/ACT inhaler Inhale 2 puffs into the lungs every 6 (six) hours as needed.        Marland Kitchen atorvastatin (LIPITOR) 20 MG tablet Take 1 tablet (20 mg total) by mouth daily.  30 tablet  0  . B Complex Vitamins (VITAMIN-B COMPLEX PO) Take by mouth daily.      . Cholecalciferol (VITAMIN D3) 1000 UNITS CAPS Take by mouth 3 (three) times a week.       . hydrochlorothiazide (HYDRODIURIL) 25 MG tablet TAKE AS DIRECTED AS NEEDED  30 tablet  0  . naproxen (NAPROSYN) 500 MG tablet TAKE 1 TABLET BY MOUTH TWO TIMES DAILY WITH A MEAL.  120 tablet  2  . triamcinolone (NASACORT) 55 MCG/ACT nasal inhaler One spray to each nostril daily as needed  16.5 g  2   No current facility-administered medications for this visit.    ROS: Pertinent items are noted in HPI.  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  Exam:   [employer]  up 3 pounds and ht stable from last year BP 120/60  Pulse 78  Resp 18  Ht 5' 3.25" (1.607 m)  Wt 147 lb (66.679 kg)  BMI 25.82 kg/m2  LMP 10/26/2005   Wt Readings from Last 3 Encounters:  04/18/13 147 lb (66.679 kg)  03/04/12 150 lb (68.04 kg)  12/08/11 146 lb (66.225 kg)     Ht Readings from Last 3 Encounters:  04/18/13 5' 3.25" (1.607 m)  12/08/11 5\' 3"  (1.6 m)  02/17/11 5\' 3"  (1.6 m)    General appearance: alert, cooperative and appears stated age Head: Normocephalic, without obvious abnormality, atraumatic Neck: no adenopathy, supple, symmetrical, trachea midline and thyroid not enlarged, symmetric, no tenderness/mass/nodules Lungs: clear to auscultation bilaterally Breasts: Inspection negative, No nipple retraction or dimpling, No nipple discharge or bleeding, No axillary or supraclavicular adenopathy, Normal to palpation without dominant masses Heart: regular  rate and rhythm Abdomen: soft, non-tender; bowel sounds normal; no masses,  no organomegaly Extremities: extremities normal, atraumatic, no cyanosis or edema Skin: Skin color, texture, turgor normal. No rashes or lesions Lymph nodes: Cervical, supraclavicular, and axillary nodes normal. No abnormal inguinal nodes palpated Neurologic: Grossly normal   Pelvic: External genitalia:  no lesions              Urethra:  normal appearing urethra with no masses, tenderness or lesions              Bartholins and Skenes: normal                 Vagina: normal appearing vagina with normal color and discharge, no lesions              Cervix: normal appearance              Pap taken: no        Bimanual Exam:  Uterus:  uterus is normal size, shape, consistency and nontender                                      Adnexa: absent, nontender and no masses                                      Rectovaginal: Confirms                                      Anus:  normal sphincter tone, no lesions  A: normal menopausal exam, no HRT     BSO d/t right ov cyst and FH of ov cancer in Mother.  Cyst benign.     asthma     P:     mammogram counseled on breast self exam, mammography screening, adequate intake of calcium and vitamin D, diet and exercise return annually or prn     An After Visit Summary was printed and given to the patient.

## 2013-04-18 NOTE — Patient Instructions (Signed)

## 2013-04-21 ENCOUNTER — Ambulatory Visit (INDEPENDENT_AMBULATORY_CARE_PROVIDER_SITE_OTHER): Payer: BC Managed Care – PPO | Admitting: Family Medicine

## 2013-04-21 ENCOUNTER — Encounter: Payer: Self-pay | Admitting: Family Medicine

## 2013-04-21 ENCOUNTER — Ambulatory Visit: Payer: BC Managed Care – PPO

## 2013-04-21 ENCOUNTER — Telehealth: Payer: Self-pay | Admitting: Family Medicine

## 2013-04-21 VITALS — BP 128/80 | HR 78 | Temp 98.7°F | Wt 149.0 lb

## 2013-04-21 DIAGNOSIS — E785 Hyperlipidemia, unspecified: Secondary | ICD-10-CM

## 2013-04-21 DIAGNOSIS — Z0000 Encounter for general adult medical examination without abnormal findings: Secondary | ICD-10-CM

## 2013-04-21 MED ORDER — HYDROCHLOROTHIAZIDE 25 MG PO TABS: ORAL_TABLET | ORAL | Status: DC

## 2013-04-21 NOTE — Telephone Encounter (Signed)
RX sent to pharmacy  

## 2013-04-21 NOTE — Telephone Encounter (Signed)
Pt was seen this morning for her cpx. States that she was to get refills for 1 year on her hydrochlorothiazide (HYDRODIURIL) 25 MG tablet. Does not look like that was done, and patient is leaving town Wed early. Please send in to [facility]. Thanks.

## 2013-04-21 NOTE — Progress Notes (Signed)
Subjective:    Patient ID: Bridget Flynn, female    DOB: 1956-06-23, 57 y.o.   MRN: [id number]  HPI Here for complete physical. She still sees gynecologist regularly. She has history of dyslipidemia. She quit taking Lipitor several months ago because of some arthralgias. Her arthralgias promptly went away after stopping medication. She retry taking it once more but had arthralgias again.  She has mammogram scheduled for later today. She'll he takes HCTZ occasionally for edema issues and not for hypertension. She's nonsmoker. Colonoscopy up to date. Tetanus up-to-date.  She has recently started exercise program and is making dietary changes.  Past Medical History  Diagnosis Date  . VITAMIN D DEFICIENCY 07/12/2009    Qualifier: Diagnosis of  By: Gabriel Rung LPN, Harriett Sine    . HYPERLIPIDEMIA 07/12/2009    Qualifier: Diagnosis of  By: Gabriel Rung LPN, Harriett Sine    . SINUSITIS- ACUTE-NOS 01/13/2010    Qualifier: Diagnosis of  By: Cato Mulligan MD, Enslee Bibbins    . ALLERGIC RHINITIS 11/25/2007    Qualifier: Diagnosis of  By: Yetta Barre CNA/MA, Shanda Bumps    . ASTHMA 11/25/2007    Qualifier: Diagnosis of  By: Yetta Barre CNA/MA, Shanda Bumps    . HYPOGLYCEMIA, HX OF 05/06/2009    Qualifier: Diagnosis of  By: Linna Darner, CMA, Cindy    . Asthma    Past Surgical History  Procedure Laterality Date  . Colonoscopy w/ polypectomy  2004  . Laparoscopic bilateral salpingectomy  08/24/08    right ovarian cyst resolved  . Dilation and curettage of uterus      reports that she has never smoked. She has never used smokeless tobacco. She reports that she drinks about 0.5 ounces of alcohol per week. She reports that she does not use illicit drugs. family history includes Cancer in her father, mother, and other and Diabetes in her father, mother, and other. Allergies  Allergen Reactions  . Allegra (Fexofenadine Hcl)     Depression, cryingspells  . Biaxin (Clarithromycin)     REACTION: diarrhea, vomiting  . Erythromycin     REACTION: GI upset  .  Lipitor (Atorvastatin) Other (See Comments)    arthralgias  . Sulfa Antibiotics       Review of Systems  Constitutional: Negative for fever, activity change, appetite change, fatigue and unexpected weight change.  HENT: Negative for hearing loss, ear pain, sore throat and trouble swallowing.   Eyes: Negative for visual disturbance.  Respiratory: Negative for cough and shortness of breath.   Cardiovascular: Negative for chest pain and palpitations.  Gastrointestinal: Negative for abdominal pain, diarrhea, constipation and blood in stool.  Genitourinary: Negative for dysuria and hematuria.  Musculoskeletal: Negative for myalgias, back pain and arthralgias.  Skin: Negative for rash.  Neurological: Negative for dizziness, syncope and headaches.  Hematological: Negative for adenopathy.  Psychiatric/Behavioral: Negative for confusion and dysphoric mood.       Objective:   Physical Exam  Constitutional: She is oriented to person, place, and time. She appears well-developed and well-nourished.  HENT:  Head: Normocephalic and atraumatic.  Eyes: EOM are normal. Pupils are equal, round, and reactive to light.  Neck: Normal range of motion. Neck supple. No thyromegaly present.  Cardiovascular: Normal rate, regular rhythm and normal heart sounds.   No murmur heard. Pulmonary/Chest: Breath sounds normal. No respiratory distress. She has no wheezes. She has no rales.  Abdominal: Soft. Bowel sounds are normal. She exhibits no distension and no mass. There is no tenderness. There is no rebound and no guarding.  Genitourinary:  Per GYN  Musculoskeletal: Normal range of motion. She exhibits no edema.  Lymphadenopathy:    She has no cervical adenopathy.  Neurological: She is alert and oriented to person, place, and time. She displays normal reflexes. No cranial nerve deficit.  Skin: No rash noted.  Psychiatric: She has a normal mood and affect. Her behavior is normal. Judgment and thought  content normal.          Assessment & Plan:  Healthy 57 year old female. Labs discussed. Lipids are moderately elevated. We discussed pros and cons of statin therapy. This point she wishes to maximize lifestyle changes and repeat fasting lipids in 6 months. Her calculated risks for CAD for 10 years is 3%.

## 2013-04-21 NOTE — Patient Instructions (Addendum)

## 2013-05-06 ENCOUNTER — Ambulatory Visit: Payer: BC Managed Care – PPO

## 2013-06-03 ENCOUNTER — Ambulatory Visit
Admission: RE | Admit: 2013-06-03 | Discharge: 2013-06-03 | Disposition: A | Discharge status: Home / Self Care | Payer: BC Managed Care – PPO | Source: Ambulatory Visit

## 2013-06-03 DIAGNOSIS — Z1231 Encounter for screening mammogram for malignant neoplasm of breast: Secondary | ICD-10-CM

## 2013-08-19 ENCOUNTER — Encounter: Payer: Self-pay | Admitting: Family Medicine

## 2013-08-19 ENCOUNTER — Ambulatory Visit (INDEPENDENT_AMBULATORY_CARE_PROVIDER_SITE_OTHER): Payer: BC Managed Care – PPO | Admitting: Family Medicine

## 2013-08-19 VITALS — BP 130/90 | Temp 98.7°F | Wt 152.0 lb

## 2013-08-19 DIAGNOSIS — J019 Acute sinusitis, unspecified: Secondary | ICD-10-CM

## 2013-08-19 DIAGNOSIS — Z23 Encounter for immunization: Secondary | ICD-10-CM

## 2013-08-19 DIAGNOSIS — J309 Allergic rhinitis, unspecified: Secondary | ICD-10-CM

## 2013-08-19 MED ORDER — MONTELUKAST SODIUM 10 MG PO TABS: 10.0000 mg | ORAL_TABLET | Freq: Every day | ORAL | Status: DC

## 2013-08-19 MED ORDER — AZITHROMYCIN 250 MG PO TABS: ORAL_TABLET | ORAL | Status: AC

## 2013-08-19 NOTE — Progress Notes (Signed)
Pre visit review using our clinic review tool, if applicable. No additional management support is needed unless otherwise documented below in the visit note. 

## 2013-08-19 NOTE — Patient Instructions (Signed)

## 2013-08-19 NOTE — Progress Notes (Signed)
  Subjective:    Patient ID: Bridget Flynn, female    DOB: 04-Jun-1956, 57 y.o.   MRN: [id number]  HPI 2 months of off and on sinus congestion symptoms Thick green nasal discharge.  Some productive cough. Headaches off and on.  Some low grade fever.  Increased fatigue.  Pansinusitis symptoms.  She hasn't tolerance history to erythromycin but has taken Zithromax without difficulty. Patient nonsmoker. Generally very healthy.  Past Medical History  Diagnosis Date  . VITAMIN D DEFICIENCY 07/12/2009    Qualifier: Diagnosis of  By: Gabriel Rung LPN, Harriett Sine    . HYPERLIPIDEMIA 07/12/2009    Qualifier: Diagnosis of  By: Gabriel Rung LPN, Harriett Sine    . SINUSITIS- ACUTE-NOS 01/13/2010    Qualifier: Diagnosis of  By: Cato Mulligan MD, Donyale Berthold    . ALLERGIC RHINITIS 11/25/2007    Qualifier: Diagnosis of  By: Yetta Barre CNA/MA, Shanda Bumps    . ASTHMA 11/25/2007    Qualifier: Diagnosis of  By: Yetta Barre CNA/MA, Shanda Bumps    . HYPOGLYCEMIA, HX OF 05/06/2009    Qualifier: Diagnosis of  By: Linna Darner, CMA, Cindy    . Asthma    Past Surgical History  Procedure Laterality Date  . Colonoscopy w/ polypectomy  2004  . Laparoscopic bilateral salpingectomy  08/24/08    right ovarian cyst resolved  . Dilation and curettage of uterus      reports that she has never smoked. She has never used smokeless tobacco. She reports that she drinks about 0.5 ounces of alcohol per week. She reports that she does not use illicit drugs. family history includes Cancer in her father, mother, and other; Diabetes in her father, mother, and other. Allergies  Allergen Reactions  . Allegra [Fexofenadine Hcl]     Depression, cryingspells  . Biaxin [Clarithromycin]     REACTION: diarrhea, vomiting  . Erythromycin     REACTION: GI upset  . Lipitor [Atorvastatin] Other (See Comments)    arthralgias  . Sulfa Antibiotics       Review of Systems  Constitutional: Positive for fatigue. Negative for fever and chills.  HENT: Positive for congestion and sinus pressure.    Respiratory: Positive for cough.   Neurological: Positive for headaches. Negative for dizziness.       Objective:   Physical Exam  Constitutional: She appears well-developed and well-nourished.  HENT:  Right Ear: External ear normal.  Left Ear: External ear normal.  Mouth/Throat: Oropharynx is clear and moist.  Neck: Neck supple.  Cardiovascular: Normal rate and regular rhythm.   Pulmonary/Chest: Effort normal and breath sounds normal. No respiratory distress. She has no wheezes. She has no rales.  Lymphadenopathy:    She has no cervical adenopathy.          Assessment & Plan:  Acute sinusitis. Zithromax prescribed for 5 days. Continue Mucinex. Continue adequate hydration.

## 2013-09-08 ENCOUNTER — Telehealth: Payer: Self-pay | Admitting: Family Medicine

## 2013-09-08 MED ORDER — LEVOFLOXACIN 500 MG PO TABS: 500.0000 mg | ORAL_TABLET | Freq: Every day | ORAL | Status: DC

## 2013-09-08 NOTE — Telephone Encounter (Signed)
I advice Levaquin 500 mg once daily for 10 days.  If symptoms persist beyond that, I would get limited CT sinuses. Go ahead and call in that prescription.

## 2013-09-08 NOTE — Telephone Encounter (Signed)
Pt still coughing and blowing out green mucas, pt also has the terrible smell. Pt has taken 2 rounds of antibiotics.  Pt feels better, but unsure about these symptoms lingering on.  pls [facility] [place]

## 2013-09-08 NOTE — Telephone Encounter (Signed)
RX sent to pharmacy and pt is aware 

## 2013-09-22 ENCOUNTER — Telehealth: Payer: Self-pay | Admitting: Family Medicine

## 2013-09-22 NOTE — Telephone Encounter (Signed)
She has had 2 rounds of antibiotics.  Confirm she completed the 10 day course of Levaquin (which is a very broad spectrum antibiotic) If she has already taken that, we need to reassess .  May need CT sinuses to further assess.

## 2013-09-22 NOTE — Telephone Encounter (Signed)
Fu on sinuses appt made for 1/6 at 9:45am

## 2013-09-22 NOTE — Telephone Encounter (Signed)
Pt is still taking antibiotics from November office visit, she feels a little better but now has smelly mucus when she blows her nose or coughs.  Pt would like to know can something be called in for her? Please advise.

## 2013-09-22 NOTE — Telephone Encounter (Signed)
Pt stated that she has no fever, and its not constant but when coughing and blowing her nose, it comes out green mucous and it smells.

## 2013-09-22 NOTE — Telephone Encounter (Signed)
Pt informed... Can you please call and set pt appt for follow up on sinuses

## 2013-09-23 ENCOUNTER — Telehealth: Payer: Self-pay | Admitting: Family Medicine

## 2013-09-23 NOTE — Telephone Encounter (Signed)
Patient Information:  Caller Name: Shellene  Phone: [phone]  Patient: Bridget Flynn  Gender: Female  DOB: 06-30-1956  Age: 57 Years  PCP: Evelena Peat [facility])  Office Follow Up:  Does the office need to follow up with this patient?: No  Instructions For The Office: N/A   Symptoms  Reason For Call & Symptoms: Patient declines triage at this time.  She has been on 3 different antibitotics.  She has an appt to be re-evaluated next week.  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  Reviewed Health History In EMR: N/A  Reviewed Medications In EMR: N/A  Reviewed Allergies In EMR: N/A  Reviewed Surgeries / Procedures: N/A  Date of Onset of Symptoms: 06/25/2013  Treatments Tried: 3 different antibiotics  Treatments Tried Worked: No  Guideline(s) Used:  No Protocol Available - Sick Adult  Disposition Per Guideline:   See Today or Tomorrow in Office  Reason For Disposition Reached:   Nursing judgment  Advice Given:  Offered pt appt with another provider in AM.  She refused and stated that she only wants to see Dr. Caryl Never.  Advised pt this  is all we can offer her at this time.  There are no sooner appts with her MD.  She refused earlier appt.  Stated if she changes   her mind she will call back.   Patient Will Follow Care Advice:  YES

## 2013-09-24 NOTE — Telephone Encounter (Signed)
Spoke with patient and she stated that she would keep appt. Informed pt to call on Monday to see if there are any cancellations

## 2013-09-30 ENCOUNTER — Ambulatory Visit (INDEPENDENT_AMBULATORY_CARE_PROVIDER_SITE_OTHER): Payer: BC Managed Care – PPO | Admitting: Family Medicine

## 2013-09-30 ENCOUNTER — Encounter: Payer: Self-pay | Admitting: Family Medicine

## 2013-09-30 VITALS — BP 128/80 | HR 61 | Temp 97.9°F | Wt 154.0 lb

## 2013-09-30 DIAGNOSIS — J329 Chronic sinusitis, unspecified: Secondary | ICD-10-CM

## 2013-09-30 MED ORDER — AMOXICILLIN-POT CLAVULANATE 875-125 MG PO TABS: 1.0000 | ORAL_TABLET | Freq: Two times a day (BID) | ORAL | Status: DC

## 2013-09-30 NOTE — Patient Instructions (Signed)
We will call you regarding CT of sinuses

## 2013-09-30 NOTE — Progress Notes (Signed)
Pre visit review using our clinic review tool, if applicable. No additional management support is needed unless otherwise documented below in the visit note. 

## 2013-09-30 NOTE — Progress Notes (Signed)
   Subjective:    Patient ID: Bridget Flynn, female    DOB: 16-Oct-1955, 58 y.o.   MRN: [id number]  HPI Patient sustained with recurrent sinusitis symptoms. Symptoms started back in early November. She was treated 08/19/2013 with Zithromax and then ended up taking a second course. Her symptoms did not resolve and she was eventually treated with Levaquin and 500 mg daily for 10 days. She felt slightly better but symptoms did not resolve. She's had persistent headaches, facial pain mostly maxillary region Intermedic cough with greenish nasal discharge. She's had fatigue. No fever. Mild sore throat intermittently. No loss of voice. She's had similar symptoms in the past no current duration is atypical. No sick contacts. Nonsmoker  She's tried over-the-counter medications such as decongestants, antihistamines, and Mucinex without improvement.  Past Medical History  Diagnosis Date  . VITAMIN D DEFICIENCY 07/12/2009    Qualifier: Diagnosis of  By: Gabriel RungNeckers LPN, Harriett SineNancy    . HYPERLIPIDEMIA 07/12/2009    Qualifier: Diagnosis of  By: Gabriel RungNeckers LPN, Harriett SineNancy    . SINUSITIS- ACUTE-NOS 01/13/2010    Qualifier: Diagnosis of  By: Cato MulliganSwords MD, Sebastain Fishbaugh    . ALLERGIC RHINITIS 11/25/2007    Qualifier: Diagnosis of  By: Yetta BarreJones CNA/MA, Shanda BumpsJessica    . ASTHMA 11/25/2007    Qualifier: Diagnosis of  By: Yetta BarreJones CNA/MA, Shanda BumpsJessica    . HYPOGLYCEMIA, HX OF 05/06/2009    Qualifier: Diagnosis of  By: Linna DarnerWyrick, CMA, Cindy    . Asthma    Past Surgical History  Procedure Laterality Date  . Colonoscopy w/ polypectomy  2004  . Laparoscopic bilateral salpingectomy  08/24/08    right ovarian cyst resolved  . Dilation and curettage of uterus      reports that she has never smoked. She has never used smokeless tobacco. She reports that she drinks about 0.5 ounces of alcohol per week. She reports that she does not use illicit drugs. family history includes Cancer in her father, mother, and other; Diabetes in her father, mother, and other. Allergies    Allergen Reactions  . Allegra [Fexofenadine Hcl]     Depression, cryingspells  . Biaxin [Clarithromycin]     REACTION: diarrhea, vomiting  . Erythromycin     REACTION: GI upset  . Lipitor [Atorvastatin] Other (See Comments)    arthralgias  . Sulfa Antibiotics       Review of Systems  Constitutional: Positive for fatigue. Negative for fever and chills.  HENT: Positive for congestion, sinus pressure and sore throat.   Respiratory: Positive for cough.   Neurological: Positive for headaches.       Objective:   Physical Exam  Constitutional: She appears well-developed and well-nourished.  HENT:  Right Ear: External ear normal.  Left Ear: External ear normal.  Nose: Nose normal.  Mouth/Throat: Oropharynx is clear and moist.  Neck: Neck supple.  Cardiovascular: Normal rate.   Pulmonary/Chest: Effort normal and breath sounds normal. No respiratory distress. She has no wheezes. She has no rales.  Lymphadenopathy:    She has no cervical adenopathy.          Assessment & Plan:  Acute versus chronic sinusitis. Augmentin 875 mg twice a day for 10 days. Limited CT of sinuses given duration of symptoms. Consider ENT referral

## 2013-10-01 ENCOUNTER — Telehealth: Payer: Self-pay

## 2013-10-01 ENCOUNTER — Ambulatory Visit (INDEPENDENT_AMBULATORY_CARE_PROVIDER_SITE_OTHER)
Admission: RE | Admit: 2013-10-01 | Discharge: 2013-10-01 | Disposition: A | Discharge status: Home / Self Care | Payer: BC Managed Care – PPO | Source: Ambulatory Visit | Attending: Family Medicine | Admitting: Family Medicine

## 2013-10-01 DIAGNOSIS — J329 Chronic sinusitis, unspecified: Secondary | ICD-10-CM

## 2013-10-01 MED ORDER — TRIAMCINOLONE ACETONIDE 55 MCG/ACT NA AERO: 2.0000 | INHALATION_SPRAY | Freq: Every day | NASAL | Status: DC

## 2013-10-01 NOTE — Telephone Encounter (Signed)
Pt notified of results.  She has recently been on Zithromax and Levaquin and currently Augmentin. She has previously had good benefit with Nasacort AQ. We'll send in refills. Set up ENT referral. She has complete opacification the right maxillary sinuses and scattered diffuse disease

## 2013-10-01 NOTE — Telephone Encounter (Signed)
Pt results are in pt chart

## 2013-10-06 ENCOUNTER — Other Ambulatory Visit: Payer: Self-pay | Admitting: Family Medicine

## 2013-10-08 ENCOUNTER — Other Ambulatory Visit: Payer: Self-pay | Admitting: Family Medicine

## 2013-10-22 ENCOUNTER — Other Ambulatory Visit (INDEPENDENT_AMBULATORY_CARE_PROVIDER_SITE_OTHER): Payer: BC Managed Care – PPO

## 2013-10-22 DIAGNOSIS — E785 Hyperlipidemia, unspecified: Secondary | ICD-10-CM

## 2013-10-22 LAB — LIPID PANEL
CHOLESTEROL: 260 mg/dL — AB (ref 0–200)
HDL: 45.6 mg/dL (ref >39.00)
Total CHOL/HDL Ratio: 6
Triglycerides: 133 mg/dL (ref 0.0–149.0)
VLDL: 26.6 mg/dL (ref 0.0–40.0)

## 2013-10-22 LAB — LDL CHOLESTEROL, DIRECT: Direct LDL: 195.9 mg/dL

## 2013-10-24 ENCOUNTER — Other Ambulatory Visit: Payer: Self-pay | Admitting: Family Medicine

## 2013-10-24 ENCOUNTER — Other Ambulatory Visit: Payer: Self-pay

## 2013-10-24 DIAGNOSIS — E785 Hyperlipidemia, unspecified: Secondary | ICD-10-CM

## 2013-10-24 MED ORDER — ROSUVASTATIN CALCIUM 20 MG PO TABS: 20.0000 mg | ORAL_TABLET | Freq: Every day | ORAL | Status: DC

## 2014-01-12 ENCOUNTER — Telehealth: Payer: Self-pay | Admitting: Family Medicine

## 2014-01-12 MED ORDER — ROSUVASTATIN CALCIUM 20 MG PO TABS: 20.0000 mg | ORAL_TABLET | Freq: Every day | ORAL | Status: DC

## 2014-01-12 NOTE — Telephone Encounter (Signed)
Rx sent to pharmacy   

## 2014-01-12 NOTE — Telephone Encounter (Signed)
Pt states PCP started her on Crestor and she was due to have labs done to recheck cholesterol at the end of March.  Pt states she has been unable to make it in to the office to have lab work done. She is leaving to go to [place] in the morning and needs a refill of med sent to [facility].

## 2014-01-30 ENCOUNTER — Other Ambulatory Visit (INDEPENDENT_AMBULATORY_CARE_PROVIDER_SITE_OTHER): Payer: BC Managed Care – PPO

## 2014-01-30 DIAGNOSIS — E785 Hyperlipidemia, unspecified: Secondary | ICD-10-CM

## 2014-01-30 LAB — LIPID PANEL
CHOL/HDL RATIO: 3
Cholesterol: 160 mg/dL (ref 0–200)
HDL: 47.6 mg/dL (ref >39.00)
LDL CALC: 100 mg/dL — AB (ref 0–99)
TRIGLYCERIDES: 60 mg/dL (ref 0.0–149.0)
VLDL: 12 mg/dL (ref 0.0–40.0)

## 2014-02-13 ENCOUNTER — Other Ambulatory Visit: Payer: Self-pay | Admitting: Family Medicine

## 2014-03-13 ENCOUNTER — Encounter: Payer: Self-pay | Admitting: Gynecology

## 2014-03-30 ENCOUNTER — Ambulatory Visit (INDEPENDENT_AMBULATORY_CARE_PROVIDER_SITE_OTHER): Payer: BC Managed Care – PPO | Admitting: Family Medicine

## 2014-03-30 ENCOUNTER — Encounter: Payer: Self-pay | Admitting: Family Medicine

## 2014-03-30 VITALS — BP 126/80 | HR 81 | Temp 98.8°F | Wt 150.0 lb

## 2014-03-30 DIAGNOSIS — J018 Other acute sinusitis: Secondary | ICD-10-CM

## 2014-03-30 MED ORDER — AMOXICILLIN-POT CLAVULANATE 875-125 MG PO TABS: 1.0000 | ORAL_TABLET | Freq: Two times a day (BID) | ORAL | Status: DC

## 2014-03-30 NOTE — Patient Instructions (Signed)
Sinusitis Sinusitis is redness, soreness, and swelling (inflammation) of the paranasal sinuses. Paranasal sinuses are air pockets within the bones of your face (beneath the eyes, the middle of the forehead, or above the eyes). In healthy paranasal sinuses, mucus is able to drain out, and air is able to circulate through them by way of your nose. However, when your paranasal sinuses are inflamed, mucus and air can become trapped. This can allow bacteria and other germs to grow and cause infection. Sinusitis can develop quickly and last only a short time (acute) or continue over a long period (chronic). Sinusitis that lasts for more than 12 weeks is considered chronic.  CAUSES  Causes of sinusitis include:  Allergies.  Structural abnormalities, such as displacement of the cartilage that separates your nostrils (deviated septum), which can decrease the air flow through your nose and sinuses and affect sinus drainage.  Functional abnormalities, such as when the small hairs (cilia) that line your sinuses and help remove mucus do not work properly or are not present. SYMPTOMS  Symptoms of acute and chronic sinusitis are the same. The primary symptoms are pain and pressure around the affected sinuses. Other symptoms include:  Upper toothache.  Earache.  Headache.  Bad breath.  Decreased sense of smell and taste.  A cough, which worsens when you are lying flat.  Fatigue.  Fever.  Thick drainage from your nose, which often is green and may contain pus (purulent).  Swelling and warmth over the affected sinuses. DIAGNOSIS  Your caregiver will perform a physical exam. During the exam, your caregiver may:  Look in your nose for signs of abnormal growths in your nostrils (nasal polyps).  Tap over the affected sinus to check for signs of infection.  View the inside of your sinuses (endoscopy) with a special imaging device with a light attached (endoscope), which is inserted into your  sinuses. If your caregiver suspects that you have chronic sinusitis, one or more of the following tests may be recommended:  Allergy tests.  Nasal culture--A sample of mucus is taken from your nose and sent to a lab and screened for bacteria.  Nasal cytology--A sample of mucus is taken from your nose and examined by your caregiver to determine if your sinusitis is related to an allergy. TREATMENT  Most cases of acute sinusitis are related to a viral infection and will resolve on their own within 10 days. Sometimes medicines are prescribed to help relieve symptoms (pain medicine, decongestants, nasal steroid sprays, or saline sprays).  However, for sinusitis related to a bacterial infection, your caregiver will prescribe antibiotic medicines. These are medicines that will help kill the bacteria causing the infection.  Rarely, sinusitis is caused by a fungal infection. In theses cases, your caregiver will prescribe antifungal medicine. For some cases of chronic sinusitis, surgery is needed. Generally, these are cases in which sinusitis recurs more than 3 times per year, despite other treatments. HOME CARE INSTRUCTIONS   Drink plenty of water. Water helps thin the mucus so your sinuses can drain more easily.  Use a humidifier.  Inhale steam 3 to 4 times a day (for example, sit in the bathroom with the shower running).  Apply a warm, moist washcloth to your face 3 to 4 times a day, or as directed by your caregiver.  Use saline nasal sprays to help moisten and clean your sinuses.  Take over-the-counter or prescription medicines for pain, discomfort, or fever only as directed by your caregiver. SEEK IMMEDIATE MEDICAL CARE IF:    You have increasing pain or severe headaches.  You have nausea, vomiting, or drowsiness.  You have swelling around your face.  You have vision problems.  You have a stiff neck.  You have difficulty breathing. MAKE SURE YOU:   Understand these  instructions.  Will watch your condition.  Will get help right away if you are not doing well or get worse. Document Released: 09/11/2005 Document Revised: 12/04/2011 Document Reviewed: 09/26/2011 ExitCare Patient Information 2015 [employer]. This information is not intended to replace advice given to you by your health care provider. Make sure you discuss any questions you have with your health care provider.  

## 2014-03-30 NOTE — Progress Notes (Signed)
Pre visit review using our clinic review tool, if applicable. No additional management support is needed unless otherwise documented below in the visit note. 

## 2014-03-30 NOTE — Progress Notes (Signed)
   Subjective:    Patient ID: Bridget Flynn, female    DOB: Oct 31, 1955, 58 y.o.   MRN: [id number]  Cough Associated symptoms include headaches. Pertinent negatives include no chills, fever, shortness of breath or wheezing.   Acute visit.  Seen with one month history of cough, headache, sinus facial pressure, colored nasal drainage and frequent nasal congestion. She's had history of recurrent and chronic sinusitis in the past. Last winter we obtained CT of sinuses which showed pansinusitis. She saw ENT. She was treated with repeat course of Augmentin and eventually improved. Prior to that, she had been on 2 courses of Zithromax and 1 course of Levaquin without improvement she has tried things like that econgestants without improvement.  Past Medical History  Diagnosis Date  . VITAMIN D DEFICIENCY 07/12/2009    Qualifier: Diagnosis of  By: Gabriel RungNeckers LPN, Harriett SineNancy    . HYPERLIPIDEMIA 07/12/2009    Qualifier: Diagnosis of  By: Gabriel RungNeckers LPN, Harriett SineNancy    . SINUSITIS- ACUTE-NOS 01/13/2010    Qualifier: Diagnosis of  By: Cato MulliganSwords MD, Jaxsin Bottomley    . ALLERGIC RHINITIS 11/25/2007    Qualifier: Diagnosis of  By: Yetta BarreJones CNA/MA, Shanda BumpsJessica    . ASTHMA 11/25/2007    Qualifier: Diagnosis of  By: Yetta BarreJones CNA/MA, Shanda BumpsJessica    . HYPOGLYCEMIA, HX OF 05/06/2009    Qualifier: Diagnosis of  By: Linna DarnerWyrick, CMA, Cindy    . Asthma    Past Surgical History  Procedure Laterality Date  . Colonoscopy w/ polypectomy  2004  . Laparoscopic bilateral salpingectomy  08/24/08    right ovarian cyst resolved  . Dilation and curettage of uterus      reports that she has never smoked. She has never used smokeless tobacco. She reports that she drinks about .5 ounces of alcohol per week. She reports that she does not use illicit drugs. family history includes Cancer in her father, mother, and other; Diabetes in her father, mother, and other. Allergies  Allergen Reactions  . Allegra [Fexofenadine Hcl]     Depression, cryingspells  . Biaxin  [Clarithromycin]     REACTION: diarrhea, vomiting  . Erythromycin     REACTION: GI upset  . Lipitor [Atorvastatin] Other (See Comments)    arthralgias  . Sulfa Antibiotics       Review of Systems  Constitutional: Negative for fever and chills.  HENT: Positive for congestion and sinus pressure.   Respiratory: Positive for cough. Negative for shortness of breath and wheezing.   Neurological: Positive for headaches.       Objective:   Physical Exam  Constitutional: She appears well-developed and well-nourished.  HENT:  Right Ear: External ear normal.  Left Ear: External ear normal.  Mouth/Throat: Oropharynx is clear and moist.  Neck: Neck supple.  Cardiovascular: Normal rate and regular rhythm.   Pulmonary/Chest: Effort normal and breath sounds normal. No respiratory distress. She has no wheezes. She has no rales.  Lymphadenopathy:    She has no cervical adenopathy.          Assessment & Plan:  Acute versus chronic sinusitis. Augmentin 875 mg twice daily. Touch base in 2 weeks if symptoms not resolving

## 2014-04-20 ENCOUNTER — Ambulatory Visit: Payer: BC Managed Care – PPO | Admitting: Gynecology

## 2014-04-27 ENCOUNTER — Encounter: Payer: Self-pay | Admitting: Gynecology

## 2014-04-27 ENCOUNTER — Ambulatory Visit: Payer: BC Managed Care – PPO | Admitting: Gynecology

## 2014-05-13 ENCOUNTER — Other Ambulatory Visit: Payer: Self-pay | Admitting: Family Medicine

## 2014-05-25 ENCOUNTER — Other Ambulatory Visit: Payer: Self-pay

## 2014-05-25 DIAGNOSIS — Z1231 Encounter for screening mammogram for malignant neoplasm of breast: Secondary | ICD-10-CM

## 2014-06-05 ENCOUNTER — Encounter: Payer: Self-pay | Admitting: Gynecology

## 2014-06-08 ENCOUNTER — Ambulatory Visit: Payer: BC Managed Care – PPO

## 2014-06-23 ENCOUNTER — Ambulatory Visit (INDEPENDENT_AMBULATORY_CARE_PROVIDER_SITE_OTHER): Payer: BC Managed Care – PPO | Admitting: Gynecology

## 2014-06-23 ENCOUNTER — Ambulatory Visit: Payer: BC Managed Care – PPO | Admitting: Gynecology

## 2014-06-23 ENCOUNTER — Encounter: Payer: Self-pay | Admitting: Gynecology

## 2014-06-23 ENCOUNTER — Ambulatory Visit
Admission: RE | Admit: 2014-06-23 | Discharge: 2014-06-23 | Disposition: A | Discharge status: Home / Self Care | Payer: BC Managed Care – PPO | Source: Ambulatory Visit

## 2014-06-23 VITALS — BP 146/88 | HR 78 | Resp 12 | Ht 62.25 in | Wt 150.0 lb

## 2014-06-23 DIAGNOSIS — Z1231 Encounter for screening mammogram for malignant neoplasm of breast: Secondary | ICD-10-CM

## 2014-06-23 DIAGNOSIS — Z0000 Encounter for general adult medical examination without abnormal findings: Secondary | ICD-10-CM

## 2014-06-23 DIAGNOSIS — Z01419 Encounter for gynecological examination (general) (routine) without abnormal findings: Secondary | ICD-10-CM

## 2014-06-23 LAB — POCT URINALYSIS DIPSTICK
Leukocytes, UA: NEGATIVE
PH UA: 5
RBC UA: 2
UROBILINOGEN UA: NEGATIVE

## 2014-06-23 NOTE — Progress Notes (Signed)
58 y.o.  Married Caucasian female   G3P0012 here for annual exam. Pt reports menses are absent due to Menopause. She does not report hot flashes, does not have night sweats, does not have vaginal dryness.  She is not using lubricants.  She does not report post-menopasual bleeding.    Patient's last menstrual period was 10/26/2005.          Sexually active: Yes.    The current method of family planning is post menopausal status.    Exercising: Yes.    stairs, walking 1-2x/qd ,hiking  Last pap: 08/01/11 NEG HR HPV not indicated Abnormal PAP:  no Mammogram: 06/04/13 [facility] ; scheduled for this afternoon BSE: yes  Colonoscopy: 2006 per patient; 04/2006 per chart normal f/u in 10 years DEXA: 09/15/11, osteopenia Alcohol: glass a wine/month Tobacco: no  Labs: Scharlene GlossBruce Brutchette, MD ; Urine: Blood 2  Health Maintenance  Topic Date Due  . Influenza Vaccine  04/25/2014  . Mammogram  06/04/2015  . Pap Smear  04/18/2016  . Colonoscopy  10/09/2016  . Tetanus/tdap  08/04/2019    Family History  Problem Relation Age of Onset  . Diabetes Other   . Cancer Other     ovarian  . Diabetes Mother   . Cancer Mother     ovarian cancer  . Diabetes Father   . Cancer Father     Hodgkins lymphoma    Patient Active Problem List   Diagnosis Date Noted  . Osteopenia 12/08/2011  . SINUSITIS- ACUTE-NOS 01/13/2010  . VITAMIN D DEFICIENCY 07/12/2009  . HYPERLIPIDEMIA 07/12/2009  . HYPOGLYCEMIA, HX OF 05/06/2009  . ALLERGIC RHINITIS 11/25/2007  . ASTHMA 11/25/2007    Past Medical History  Diagnosis Date  . VITAMIN D DEFICIENCY 07/12/2009    Qualifier: Diagnosis of  By: Gabriel RungNeckers LPN, Harriett SineNancy    . HYPERLIPIDEMIA 07/12/2009    Qualifier: Diagnosis of  By: Gabriel RungNeckers LPN, Harriett SineNancy    . SINUSITIS- ACUTE-NOS 01/13/2010    Qualifier: Diagnosis of  By: Cato MulliganSwords MD, Bruce    . ALLERGIC RHINITIS 11/25/2007    Qualifier: Diagnosis of  By: Yetta BarreJones CNA/MA, Shanda BumpsJessica    . ASTHMA 11/25/2007    Qualifier: Diagnosis of  By: Yetta BarreJones  CNA/MA, Shanda BumpsJessica    . HYPOGLYCEMIA, HX OF 05/06/2009    Qualifier: Diagnosis of  By: Linna DarnerWyrick, CMA, Cindy    . Asthma     Past Surgical History  Procedure Laterality Date  . Colonoscopy w/ polypectomy  2004  . Laparoscopic bilateral salpingectomy  08/24/08    right ovarian cyst resolved  . Dilation and curettage of uterus      Allergies: Allegra; Biaxin; Erythromycin; Lipitor; and Sulfa antibiotics  Current Outpatient Prescriptions  Medication Sig Dispense Refill  . albuterol (PROVENTIL HFA) 108 (90 BASE) MCG/ACT inhaler Inhale 2 puffs into the lungs every 6 (six) hours as needed.        Marland Kitchen. amoxicillin-clavulanate (AUGMENTIN) 875-125 MG per tablet Take 1 tablet by mouth 2 (two) times daily.  20 tablet  1  . B Complex Vitamins (VITAMIN-B COMPLEX PO) Take by mouth daily.      . Chlorpheniramine Maleate (CHLOR-TABLETS PO) Take by mouth.      . Cholecalciferol (VITAMIN D3) 1000 UNITS CAPS Take by mouth 3 (three) times a week.       Marland Kitchen. Fexofenadine HCl (MUCINEX ALLERGY PO) Take by mouth.      . hydrochlorothiazide (HYDRODIURIL) 25 MG tablet TAKE AS DIRECTED AS NEEDED  90 tablet  2  .  naproxen (NAPROSYN) 500 MG tablet TAKE 1 TABLET BY MOUTH PRN WITH MEALS      . rosuvastatin (CRESTOR) 20 MG tablet Take 1 tablet (20 mg total) by mouth daily.  30 tablet  5  . triamcinolone (NASACORT AQ) 55 MCG/ACT AERO nasal inhaler Place 2 sprays into the nose daily.  1 Inhaler  12   No current facility-administered medications for this visit.    ROS: Pertinent items are noted in HPI.  Exam:    Ht 5' 2.25" (1.581 m)  Wt 150 lb (68.04 kg)  BMI 27.22 kg/m2  LMP 10/26/2005 Weight change: @WEIGHTCHANGE @ Last 3 height recordings:  Ht Readings from Last 3 Encounters:  06/23/14 5' 2.25" (1.581 m)  04/18/13 5' 3.25" (1.607 m)  12/08/11 5\' 3"  (1.6 m)   General appearance: alert, cooperative and appears stated age Head: Normocephalic, without obvious abnormality, atraumatic Neck: no adenopathy, no carotid  bruit, no JVD, supple, symmetrical, trachea midline and thyroid not enlarged, symmetric, no tenderness/mass/nodules Lungs: clear to auscultation bilaterally Breasts: normal appearance, no masses or tenderness Heart: regular rate and rhythm, S1, S2 normal, no murmur, click, rub or gallop Abdomen: soft, non-tender; bowel sounds normal; no masses,  no organomegaly Extremities: extremities normal, atraumatic, no cyanosis or edema Skin: Skin color, texture, turgor normal. No rashes or lesions Lymph nodes: Cervical, supraclavicular, and axillary nodes normal. no inguinal nodes palpated Neurologic: Grossly normal   Pelvic: External genitalia:  no lesions              Urethra: normal appearing urethra with no masses, tenderness or lesions              Bartholins and Skenes: Bartholin's, Urethra, Skene's normal                 Vagina: normal appearing vagina with normal color and discharge, no lesions              Cervix: normal appearance              Pap taken: No.        Bimanual Exam:  Uterus:  uterus is normal size, shape, consistency and nontender                                      Adnexa:    normal adnexa in size, nontender and no masses                                      Rectovaginal: Confirms                                      Anus:  normal sphincter tone, no lesions     1. Routine gynecological examination   mammogram pap smear due 2017 counseled on breast self exam May consider repeat DEXA 2015 return annually or prn Discussed PAP guideline changes, importance of weight bearing exercises, calcium, vit D and balanced diet. 2. Laboratory examination ordered as part of a routine general medical examination  - POCT Urinalysis Dipstick    An After Visit Summary was printed and given to the patient.

## 2014-07-27 ENCOUNTER — Encounter: Payer: Self-pay | Admitting: Gynecology

## 2014-09-23 ENCOUNTER — Other Ambulatory Visit: Payer: Self-pay | Admitting: Family Medicine

## 2014-10-28 ENCOUNTER — Other Ambulatory Visit: Payer: Self-pay | Admitting: Family Medicine

## 2014-11-04 ENCOUNTER — Encounter: Payer: Self-pay | Admitting: Obstetrics & Gynecology

## 2015-01-21 ENCOUNTER — Telehealth: Payer: Self-pay | Admitting: Family Medicine

## 2015-01-21 MED ORDER — ALBUTEROL SULFATE HFA 108 (90 BASE) MCG/ACT IN AERS: 2.0000 | INHALATION_SPRAY | Freq: Four times a day (QID) | RESPIRATORY_TRACT | Status: DC | PRN

## 2015-01-21 NOTE — Telephone Encounter (Signed)
RX  Sent to pharmacy

## 2015-01-21 NOTE — Telephone Encounter (Signed)
Patient needs albuterol (PROVENTIL HFA) 108 (90 BASE) MCG/ACT inhaler sent to Address: [address], [place], [place] [id number]  [phone]  Patient is out of town, is around people who smoke, and having asthma flare ups.

## 2015-01-26 ENCOUNTER — Other Ambulatory Visit: Payer: Self-pay | Admitting: Family Medicine

## 2015-05-12 ENCOUNTER — Other Ambulatory Visit: Payer: Self-pay | Admitting: Family Medicine

## 2015-05-15 ENCOUNTER — Other Ambulatory Visit: Payer: Self-pay | Admitting: Family Medicine

## 2015-05-24 ENCOUNTER — Ambulatory Visit: Payer: Self-pay | Admitting: Family Medicine

## 2015-06-09 ENCOUNTER — Other Ambulatory Visit: Payer: Self-pay | Admitting: Family Medicine

## 2015-06-11 ENCOUNTER — Ambulatory Visit (INDEPENDENT_AMBULATORY_CARE_PROVIDER_SITE_OTHER): Payer: BLUE CROSS/BLUE SHIELD | Admitting: Family Medicine

## 2015-06-11 ENCOUNTER — Encounter: Payer: Self-pay | Admitting: Family Medicine

## 2015-06-11 VITALS — BP 140/80 | HR 64 | Temp 99.3°F | Ht 62.25 in | Wt 150.8 lb

## 2015-06-11 DIAGNOSIS — J309 Allergic rhinitis, unspecified: Secondary | ICD-10-CM

## 2015-06-11 DIAGNOSIS — I10 Essential (primary) hypertension: Secondary | ICD-10-CM

## 2015-06-11 DIAGNOSIS — Z23 Encounter for immunization: Secondary | ICD-10-CM | POA: Diagnosis not present

## 2015-06-11 DIAGNOSIS — E785 Hyperlipidemia, unspecified: Secondary | ICD-10-CM

## 2015-06-11 MED ORDER — MONTELUKAST SODIUM 10 MG PO TABS: 10.0000 mg | ORAL_TABLET | Freq: Every day | ORAL | Status: DC

## 2015-06-11 MED ORDER — CRESTOR 20 MG PO TABS: 20.0000 mg | ORAL_TABLET | Freq: Every day | ORAL | Status: DC

## 2015-06-11 MED ORDER — HYDROCHLOROTHIAZIDE 25 MG PO TABS: 25.0000 mg | ORAL_TABLET | Freq: Every day | ORAL | Status: DC | PRN

## 2015-06-11 MED ORDER — LEVOCETIRIZINE DIHYDROCHLORIDE 5 MG PO TABS: 5.0000 mg | ORAL_TABLET | Freq: Every evening | ORAL | Status: DC

## 2015-06-11 NOTE — Progress Notes (Signed)
Pre visit review using our clinic review tool, if applicable. No additional management support is needed unless otherwise documented below in the visit note. 

## 2015-06-11 NOTE — Progress Notes (Signed)
   Subjective:    Patient ID: Bridget Flynn, female    DOB: 12/28/1955, 59 y.o.   MRN: [id number]  HPI Seen for several issues  Hypertension. Treated with HCTZ. Needs refills. Blood pressures have been stable. No recent dizziness. No headaches.  Hyperlipidemia. She's on Crestor. Overdue for labs.  Ongoing daily allergy symptoms. She has currently been taking chlorpheniramine which helped somewhat. She previously had good success with Nasacort prescription but states that over-the-counter does not seem to work as well. She has frequent sneezing and postnasal drip symptoms. She has previously tried Careers adviser or Claritin and Astelin either had side effects or no relief with those. No recent sinusitis symptoms.  Past Medical History  Diagnosis Date  . VITAMIN D DEFICIENCY 07/12/2009    Qualifier: Diagnosis of  By: Gabriel Rung LPN, Harriett Sine    . HYPERLIPIDEMIA 07/12/2009    Qualifier: Diagnosis of  By: Gabriel Rung LPN, Harriett Sine    . SINUSITIS- ACUTE-NOS 01/13/2010    Qualifier: Diagnosis of  By: Cato Mulligan MD, Tresten Pantoja    . ALLERGIC RHINITIS 11/25/2007    Qualifier: Diagnosis of  By: Yetta Barre CNA/MA, Shanda Bumps    . ASTHMA 11/25/2007    Qualifier: Diagnosis of  By: Yetta Barre CNA/MA, Shanda Bumps    . HYPOGLYCEMIA, HX OF 05/06/2009    Qualifier: Diagnosis of  By: Linna Darner, CMA, Cindy    . Asthma    Past Surgical History  Procedure Laterality Date  . Colonoscopy w/ polypectomy  2004  . Laparoscopic bilateral salpingectomy  08/24/08    right ovarian cyst resolved  . Dilation and curettage of uterus      reports that she has never smoked. She has never used smokeless tobacco. She reports that she drinks about 0.5 oz of alcohol per week. She reports that she does not use illicit drugs. family history includes Cancer in her father, mother, and other; Diabetes in her father, mother, and other. Allergies  Allergen Reactions  . Allegra [Fexofenadine Hcl]     Depression, cryingspells  . Biaxin [Clarithromycin]     REACTION: diarrhea,  vomiting  . Erythromycin     REACTION: GI upset  . Lipitor [Atorvastatin] Other (See Comments)    arthralgias  . Sulfa Antibiotics       Review of Systems  Constitutional: Negative for fever, chills and fatigue.  HENT: Positive for congestion and postnasal drip. Negative for sore throat.   Eyes: Negative for visual disturbance.  Respiratory: Negative for cough, chest tightness, shortness of breath and wheezing.   Cardiovascular: Negative for chest pain, palpitations and leg swelling.  Neurological: Negative for dizziness, seizures, syncope, weakness, light-headedness and headaches.  Hematological: Negative for adenopathy.       Objective:   Physical Exam  Constitutional: She appears well-developed and well-nourished.  HENT:  Right Ear: External ear normal.  Left Ear: External ear normal.  Mouth/Throat: Oropharynx is clear and moist.  Neck: Neck supple.  Cardiovascular: Normal rate and regular rhythm.   Pulmonary/Chest: Effort normal and breath sounds normal. No respiratory distress. She has no wheezes. She has no rales.  Musculoskeletal: She exhibits no edema.          Assessment & Plan:  #1 perennial allergies. Consider over-the-counter Flonase. Xyzal 5 mg 1 daily. Refill Singulair. #2 hyperlipidemia. Refill Crestor. Patient will schedule complete physical and get lipids then #3 hypertension stable. Refill HCTZ #4 health maintenance. Flu vaccine given.

## 2015-06-13 DIAGNOSIS — I10 Essential (primary) hypertension: Secondary | ICD-10-CM | POA: Insufficient documentation

## 2015-06-14 ENCOUNTER — Telehealth: Payer: Self-pay | Admitting: Family Medicine

## 2015-06-14 ENCOUNTER — Other Ambulatory Visit: Payer: Self-pay

## 2015-06-14 NOTE — Telephone Encounter (Signed)
Go ahead and start Augmentin 875 mg po bid for 10 days.

## 2015-06-14 NOTE — Telephone Encounter (Signed)
Would you like to schedule an appt or give advice

## 2015-06-14 NOTE — Telephone Encounter (Signed)
Patient Name: Bridget Flynn DOB: 01-14-1956 Initial Comment Caller states she was seen Friday for allergies, put on nose spray and pill. Both were knocking her out. Now blowing out green mucus with cough. Nurse Assessment Nurse: Elijah Birk, RN, Lynda Date/Time (Eastern Time): 06/14/2015 11:26:33 AM Confirm and document reason for call. If symptomatic, describe symptoms. ---Caller states she was seen Friday for allergies, put on nose spray - Flonase and pill - Levocetirizine. The antihistamine (24 hr.) was knocking her out. Now blowing out green mucus with cough. Gets bad sinus infections. 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 Has had a lot of drainage since April, has tried other antihistamines. Not sure if fever. Has the patient traveled out of the country within the last 30 days? ---Not Applicable Does the patient require triage? ---Yes Related visit to physician within the last 2 weeks? ---Yes Does the PT have any chronic conditions? (i.e. diabetes, asthma, etc.) ---Yes List chronic conditions. ---allergic to salt, on a rx, takes Crestor Guidelines Guideline Title Affirmed Question Affirmed Notes Sinus Pain or Congestion Earache Final Disposition User See Physician within 24 Hours [place], RN, Stark Bray Comments Will verify contact # with patient coordinator. Correction: Caller is leaving in 10 days for a trip & will be away for a month. She would like further advice on her symptoms from her Dr. If he wants to put her on an antibiotic, she would prefer Augmentin only because she knows it works for her. She uses [facility] in [place]. Offered to schedule her, however she was just seen in office on Friday. Please contact her at this #. Referrals REFERRED TO PCP OFFICE Disagree/Comply: Comply

## 2015-06-15 MED ORDER — AMOXICILLIN-POT CLAVULANATE 875-125 MG PO TABS: 1.0000 | ORAL_TABLET | Freq: Two times a day (BID) | ORAL | Status: DC

## 2015-06-15 NOTE — Telephone Encounter (Signed)
Rx sent in. Left voicemail for patient to call back to make aware.

## 2015-06-15 NOTE — Telephone Encounter (Signed)
Patient is aware 

## 2015-08-26 ENCOUNTER — Other Ambulatory Visit: Payer: Self-pay

## 2015-08-26 DIAGNOSIS — Z1231 Encounter for screening mammogram for malignant neoplasm of breast: Secondary | ICD-10-CM

## 2015-09-02 ENCOUNTER — Encounter: Payer: Self-pay | Admitting: Obstetrics & Gynecology

## 2015-09-02 ENCOUNTER — Ambulatory Visit (INDEPENDENT_AMBULATORY_CARE_PROVIDER_SITE_OTHER): Payer: BLUE CROSS/BLUE SHIELD | Admitting: Obstetrics & Gynecology

## 2015-09-02 VITALS — BP 124/80 | HR 76 | Resp 16 | Ht 63.25 in | Wt 154.0 lb

## 2015-09-02 DIAGNOSIS — Z0000 Encounter for general adult medical examination without abnormal findings: Secondary | ICD-10-CM

## 2015-09-02 DIAGNOSIS — Z202 Contact with and (suspected) exposure to infections with a predominantly sexual mode of transmission: Secondary | ICD-10-CM | POA: Diagnosis not present

## 2015-09-02 DIAGNOSIS — Z01419 Encounter for gynecological examination (general) (routine) without abnormal findings: Secondary | ICD-10-CM

## 2015-09-02 DIAGNOSIS — M8580 Other specified disorders of bone density and structure, unspecified site: Secondary | ICD-10-CM

## 2015-09-02 DIAGNOSIS — Z124 Encounter for screening for malignant neoplasm of cervix: Secondary | ICD-10-CM

## 2015-09-02 LAB — COMPREHENSIVE METABOLIC PANEL
ALK PHOS: 45 U/L (ref 33–130)
ALT: 22 U/L (ref 6–29)
AST: 21 U/L (ref 10–35)
Albumin: 4.6 g/dL (ref 3.6–5.1)
BILIRUBIN TOTAL: 0.5 mg/dL (ref 0.2–1.2)
BUN: 17 mg/dL (ref 7–25)
CALCIUM: 10.3 mg/dL (ref 8.6–10.4)
CHLORIDE: 93 mmol/L — AB (ref 98–110)
CO2: 34 mmol/L — ABNORMAL HIGH (ref 20–31)
Creat: 0.74 mg/dL (ref 0.50–0.99)
Glucose, Bld: 90 mg/dL (ref 65–99)
POTASSIUM: 4.2 mmol/L (ref 3.5–5.3)
Sodium: 139 mmol/L (ref 135–146)
TOTAL PROTEIN: 7.7 g/dL (ref 6.1–8.1)

## 2015-09-02 LAB — TSH: TSH: 3.452 u[IU]/mL (ref 0.350–4.500)

## 2015-09-02 NOTE — Progress Notes (Signed)
Patient ID: Bridget RingsJoni D Novick, female   DOB: 1956/04/14, 59 y.o.   MRN: [id number]   59 y.o. [id number] MarriedCaucasianF here for annual exam.  Doing well.  No vaginal bleeding.  Has questions today about whether both fallopian tubes were removed.  Pt's mother was in late 8070's when she had ovarian cancer.  Pt has recently learned that tubal tissue can "turn into" ovarian cancer.  No breast cancer in her family either.  No vaginal bleeding.   Pt reports she's started having episodes of "feeling hot".  It's not like a hot flash as it just lasts for a couple of days.  She states she can't "figure it out".  Not sure what to do.  Patient's last menstrual period was 10/26/2005.          Sexually active: Yes.    The current method of family planning is post menopausal status.    Exercising: Yes.    Home exercise routine includes running up and down stairs and walking. Smoker:  no  Health Maintenance: Pap:  08-01-11 WNL NEG HR HPV History of abnormal Pap:  no MMG:  06-24-14 WNL BI RADS 1 Colonoscopy:  05/14/2006, Dr. Jarold MottoPatterson.  WNL.  Follow up 10 years.  BMD:   09-15-11 Low Bone Mass TDaP: 08-03-09 Screening Labs: PCP does labs   reports that she has never smoked. She has never used smokeless tobacco. She reports that she drinks about 0.5 oz of alcohol per week. She reports that she does not use illicit drugs.  Past Medical History  Diagnosis Date  . VITAMIN D DEFICIENCY 07/12/2009    Qualifier: Diagnosis of  By: Gabriel RungNeckers LPN, Harriett SineNancy    . HYPERLIPIDEMIA 07/12/2009    Qualifier: Diagnosis of  By: Gabriel RungNeckers LPN, Harriett SineNancy    . SINUSITIS- ACUTE-NOS 01/13/2010    Qualifier: Diagnosis of  By: Cato MulliganSwords MD, Bruce    . ALLERGIC RHINITIS 11/25/2007    Qualifier: Diagnosis of  By: Yetta BarreJones CNA/MA, Shanda BumpsJessica    . ASTHMA 11/25/2007    Qualifier: Diagnosis of  By: Yetta BarreJones CNA/MA, Shanda BumpsJessica    . HYPOGLYCEMIA, HX OF 05/06/2009    Qualifier: Diagnosis of  By: Linna DarnerWyrick, CMA, Cindy    . Asthma     Past Surgical History  Procedure  Laterality Date  . Colonoscopy w/ polypectomy  2004  . Laparoscopic bilateral salpingectomy  08/24/08    right ovarian cyst resolved  . Dilation and curettage of uterus      Current Outpatient Prescriptions  Medication Sig Dispense Refill  . albuterol (PROVENTIL HFA) 108 (90 BASE) MCG/ACT inhaler Inhale 2 puffs into the lungs every 6 (six) hours as needed. 1 Inhaler 0  . B Complex Vitamins (VITAMIN-B COMPLEX PO) Take by mouth daily.    . Chlorpheniramine Maleate (CHLOR-TABLETS PO) Take by mouth.    . Cholecalciferol (VITAMIN D3) 1000 UNITS CAPS Take by mouth 3 (three) times a week.     . CRESTOR 20 MG tablet Take 1 tablet (20 mg total) by mouth daily. 90 tablet 3  . Fexofenadine HCl (MUCINEX ALLERGY PO) Take by mouth.    . fluticasone (FLONASE) 50 MCG/ACT nasal spray Place into both nostrils daily.    . hydrochlorothiazide (HYDRODIURIL) 25 MG tablet Take 1 tablet (25 mg total) by mouth daily as needed. 90 tablet 1  . montelukast (SINGULAIR) 10 MG tablet Take 1 tablet (10 mg total) by mouth at bedtime. 90 tablet 3  . naproxen (NAPROSYN) 500 MG tablet TAKE 1 TABLET BY MOUTH PRN  WITH MEALS     No current facility-administered medications for this visit.    Family History  Problem Relation Age of Onset  . Diabetes Other   . Cancer Other     ovarian  . Diabetes Mother   . Cancer Mother     ovarian cancer  . Diabetes Father   . Cancer Father     Hodgkins lymphoma    ROS:  Pertinent items are noted in HPI.  Otherwise, a comprehensive ROS was negative.  Exam:   BP 124/80 mmHg  Pulse 76  Resp 16  Ht 5' 3.25" (1.607 m)  Wt 154 lb (69.854 kg)  BMI 27.05 kg/m2  LMP 10/26/2005    Height: 5' 3.25" (160.7 cm)  Ht Readings from Last 3 Encounters:  09/02/15 5' 3.25" (1.607 m)  06/11/15 5' 2.25" (1.581 m)  06/23/14 5' 2.25" (1.581 m)    General appearance: alert, cooperative and appears stated age Head: Normocephalic, without obvious abnormality, atraumatic Neck: no adenopathy,  supple, symmetrical, trachea midline and thyroid normal to inspection and palpation Lungs: clear to auscultation bilaterally Breasts: normal appearance, no masses or tenderness Heart: regular rate and rhythm Abdomen: soft, non-tender; bowel sounds normal; no masses,  no organomegaly Extremities: extremities normal, atraumatic, no cyanosis or edema Skin: Skin color, texture, turgor normal. No rashes or lesions Lymph nodes: Cervical, supraclavicular, and axillary nodes normal. No abnormal inguinal nodes palpated Neurologic: Grossly normal   Pelvic: External genitalia:  no lesions              Urethra:  normal appearing urethra with no masses, tenderness or lesions              Bartholins and Skenes: normal                 Vagina: normal appearing vagina with normal color and discharge, no lesions              Cervix: no lesions              Pap taken: Yes.   Bimanual Exam:  Uterus:  normal size, contour, position, consistency, mobility, non-tender              Adnexa: normal adnexa and no mass, fullness, tenderness               Rectovaginal: Confirms               Anus:  normal sphincter tone, no lesions  Chaperone was present for exam.  A: Well woman exam PMP, no HRT Asthma Hypertenison Osteopenia New onset of episodes of feeling "hot" this year  Plan:   Mammogram yearly.  Pt already has scheduled BMD order placed.  She is going to call and schedule this as well Hep C testing and HIV testing today CMP and TSH today Additional labs and vaccines are done with Dr. Caryl Never AEX 1 year or follow up prn

## 2015-09-03 LAB — HIV ANTIBODY (ROUTINE TESTING W REFLEX): HIV: NONREACTIVE

## 2015-09-03 LAB — HEPATITIS C ANTIBODY: HCV Ab: NEGATIVE

## 2015-09-07 LAB — IPS PAP TEST WITH HPV

## 2015-09-22 ENCOUNTER — Telehealth: Payer: Self-pay | Admitting: Family Medicine

## 2015-09-22 MED ORDER — AMOXICILLIN-POT CLAVULANATE 875-125 MG PO TABS: ORAL_TABLET | ORAL | Status: DC

## 2015-09-22 NOTE — Telephone Encounter (Signed)
She has had recurrent sinusitis in the past.  Augmentin 875 mg po bid for 10 days. Will need office follow up if not better after that.

## 2015-09-22 NOTE — Telephone Encounter (Signed)
Please see message and advise 

## 2015-09-22 NOTE — Telephone Encounter (Signed)
Spoke to pt, told her Rx for Augmentin 875 mg one tablet twice a day x 10 days was sent to pharmacy. Will need follow up after completing abx if not better per Dr. Caryl NeverBurchette. Pt verbalized understanding.

## 2015-09-22 NOTE — Telephone Encounter (Signed)
Pt has ear pain,cough and sore throat and would like to been seen today.

## 2015-09-22 NOTE — Telephone Encounter (Signed)
Patient Name: Bridget Flynn DOB: 11-16-1955 Initial Comment Caller states left side of her face aches; dull ache; stopped up; green and red mucus; low grade fever; headache; sometimes when she swallow the inside of her mouth and ear hurts; sore throat; Nurse Assessment Nurse: Elijah Birkaldwell, RN, Lynda Date/Time (Eastern Time): 09/22/2015 12:00:31 PM Confirm and document reason for call. If symptomatic, describe symptoms. ---Caller states right side of her face aches, dull ache. She is stopped up with lots of thick, green and red mucus. Has a low grade fever, headache & sometimes when she swallows the inside of her mouth and right ear hurts with sore throat. Symptoms off & on since Thanksgiving. Gets wicked sinus infections, sometimes has to take multiple antibiotics to get rid of them. Has the patient traveled out of the country within the last 30 days? ---Not Applicable Does the patient have any new or worsening symptoms? ---Yes Will a triage be completed? ---Yes Related visit to physician within the last 2 weeks? ---No Does the PT have any chronic conditions? (i.e. diabetes, asthma, etc.) ---Yes List chronic conditions. ---HTZ, Crestor Is this a behavioral health or substance abuse call? ---No Guidelines Guideline Title Affirmed Question Affirmed Notes Sinus Pain or Congestion [1] Sinus pain (not just congestion) AND [2] fever Final Disposition User See Physician within 24 Hours [place]well, RN, Stark BrayLynda Comments Caller states if her Dr. wants her to come in to be seen, she can, but would prefer not to be around other sick people with the way she feels. Her preferred pharmacy is [facility] in [place]. See list of allergies. She believes Augmentin worked the last time she had a stubborn sinus infections. Referrals REFERRED TO PCP OFFICE Disagree/Comply: Comply

## 2015-09-22 NOTE — Telephone Encounter (Signed)
Pt was called back and transferred to [facility]

## 2015-10-08 ENCOUNTER — Other Ambulatory Visit: Payer: Self-pay | Admitting: *Deleted

## 2015-10-08 MED ORDER — ROSUVASTATIN CALCIUM 20 MG PO TABS: 20.0000 mg | ORAL_TABLET | Freq: Every day | ORAL | Status: DC

## 2015-10-12 ENCOUNTER — Ambulatory Visit: Payer: BLUE CROSS/BLUE SHIELD

## 2015-11-15 ENCOUNTER — Ambulatory Visit
Admission: RE | Admit: 2015-11-15 | Discharge: 2015-11-15 | Disposition: A | Discharge status: Home / Self Care | Payer: BLUE CROSS/BLUE SHIELD | Source: Ambulatory Visit

## 2015-11-15 ENCOUNTER — Ambulatory Visit
Admission: RE | Admit: 2015-11-15 | Discharge: 2015-11-15 | Disposition: A | Discharge status: Home / Self Care | Payer: BLUE CROSS/BLUE SHIELD | Source: Ambulatory Visit | Attending: Obstetrics & Gynecology | Admitting: Obstetrics & Gynecology

## 2015-11-15 DIAGNOSIS — Z1231 Encounter for screening mammogram for malignant neoplasm of breast: Secondary | ICD-10-CM

## 2015-11-15 DIAGNOSIS — M8580 Other specified disorders of bone density and structure, unspecified site: Secondary | ICD-10-CM

## 2015-11-15 NOTE — Progress Notes (Signed)
Quick Note:  Informed pt of results via Mychart.  Pt note: Bridget Flynn, Your bone density testing showed just mild osteopenia like in the past. You do not need any treatment for this. I would recommend repeating the bone density in 5 years. Please let me know if you have any questions.  Dr. Hyacinth Meeker ______

## 2015-11-30 ENCOUNTER — Other Ambulatory Visit: Payer: Self-pay | Admitting: Family Medicine

## 2015-12-06 ENCOUNTER — Ambulatory Visit (INDEPENDENT_AMBULATORY_CARE_PROVIDER_SITE_OTHER): Payer: BLUE CROSS/BLUE SHIELD | Admitting: Family Medicine

## 2015-12-06 VITALS — BP 140/88 | HR 78 | Temp 98.8°F | Ht 63.0 in | Wt 154.1 lb

## 2015-12-06 DIAGNOSIS — J0141 Acute recurrent pansinusitis: Secondary | ICD-10-CM | POA: Diagnosis not present

## 2015-12-06 MED ORDER — AMOXICILLIN-POT CLAVULANATE 875-125 MG PO TABS: 1.0000 | ORAL_TABLET | Freq: Two times a day (BID) | ORAL | Status: DC

## 2015-12-06 NOTE — Progress Notes (Signed)
   Subjective:    Patient ID: Bridget RingsJoni D Ruhe, female    DOB: 1955-10-03, 60 y.o.   MRN: [id number]  HPI  acute visit for sinusitis symptoms. Long history of recurrent sinusitis in the past.  She describes pansinusitis symptoms over the past few weeks. She has occasional nonproductive cough. Occasional headaches. Increased fatigue. Frequent greenish nasal discharge bilaterally.  Takes Flonase at baseline.  Past Medical History  Diagnosis Date  . VITAMIN D DEFICIENCY 07/12/2009    Qualifier: Diagnosis of  By: Gabriel RungNeckers LPN, Harriett SineNancy    . HYPERLIPIDEMIA 07/12/2009    Qualifier: Diagnosis of  By: Gabriel RungNeckers LPN, Harriett SineNancy    . SINUSITIS- ACUTE-NOS 01/13/2010    Qualifier: Diagnosis of  By: Cato MulliganSwords MD, Purnell Daigle    . ALLERGIC RHINITIS 11/25/2007    Qualifier: Diagnosis of  By: Yetta BarreJones CNA/MA, Shanda BumpsJessica    . ASTHMA 11/25/2007    Qualifier: Diagnosis of  By: Yetta BarreJones CNA/MA, Shanda BumpsJessica    . HYPOGLYCEMIA, HX OF 05/06/2009    Qualifier: Diagnosis of  By: Linna DarnerWyrick, CMA, Cindy    . Asthma    Past Surgical History  Procedure Laterality Date  . Colonoscopy w/ polypectomy  2004  . Laparoscopic bilateral salpingectomy  08/24/08  . Dilation and curettage of uterus      reports that she has never smoked. She has never used smokeless tobacco. She reports that she drinks about 0.5 oz of alcohol per week. She reports that she does not use illicit drugs. family history includes Cancer in her father, mother, and other; Diabetes in her father, mother, and other. Allergies  Allergen Reactions  . Allegra [Fexofenadine Hcl]     Depression, cryingspells  . Biaxin [Clarithromycin]     REACTION: diarrhea, vomiting  . Erythromycin     REACTION: GI upset  . Lipitor [Atorvastatin] Other (See Comments)    arthralgias  . Sulfa Antibiotics       Review of Systems  Constitutional: Negative for fever and chills.  HENT: Positive for congestion and sinus pressure.   Respiratory: Positive for cough.        Objective:   Physical Exam    Constitutional: She appears well-developed and well-nourished.  HENT:  Right Ear: External ear normal.  Left Ear: External ear normal.  Mouth/Throat: Oropharynx is clear and moist.  Neck: Neck supple.  Cardiovascular: Normal rate and regular rhythm.   Pulmonary/Chest: Effort normal and breath sounds normal. No respiratory distress. She has no wheezes. She has no rales.  Lymphadenopathy:    She has no cervical adenopathy.          Assessment & Plan:   Acute pansinusitis. Augmentin 875 mg twice daily. Continue Flonase. Stay well-hydrated. Schedule complete physical.

## 2015-12-06 NOTE — Progress Notes (Signed)
Pre visit review using our clinic review tool, if applicable. No additional management support is needed unless otherwise documented below in the visit note. 

## 2015-12-06 NOTE — Patient Instructions (Signed)

## 2015-12-27 ENCOUNTER — Other Ambulatory Visit (INDEPENDENT_AMBULATORY_CARE_PROVIDER_SITE_OTHER): Payer: BLUE CROSS/BLUE SHIELD

## 2015-12-27 DIAGNOSIS — Z0000 Encounter for general adult medical examination without abnormal findings: Secondary | ICD-10-CM | POA: Diagnosis not present

## 2015-12-27 LAB — CBC WITH DIFFERENTIAL/PLATELET
BASOS PCT: 1.1 % (ref 0.0–3.0)
Basophils Absolute: 0.1 10*3/uL (ref 0.0–0.1)
EOS ABS: 0.4 10*3/uL (ref 0.0–0.7)
EOS PCT: 8.5 % — AB (ref 0.0–5.0)
HCT: 39.5 % (ref 36.0–46.0)
Hemoglobin: 13.6 g/dL (ref 12.0–15.0)
LYMPHS PCT: 42.6 % (ref 12.0–46.0)
Lymphs Abs: 2.2 10*3/uL (ref 0.7–4.0)
MCHC: 34.3 g/dL (ref 30.0–36.0)
MCV: 84.5 fl (ref 78.0–100.0)
MONOS PCT: 9.1 % (ref 3.0–12.0)
Monocytes Absolute: 0.5 10*3/uL (ref 0.1–1.0)
NEUTROS ABS: 2 10*3/uL (ref 1.4–7.7)
NEUTROS PCT: 38.7 % — AB (ref 43.0–77.0)
Platelets: 314 10*3/uL (ref 150.0–400.0)
RBC: 4.68 Mil/uL (ref 3.87–5.11)
RDW: 12.9 % (ref 11.5–15.5)
WBC: 5.3 10*3/uL (ref 4.0–10.5)

## 2015-12-27 LAB — LIPID PANEL
CHOL/HDL RATIO: 4
CHOLESTEROL: 161 mg/dL (ref 0–200)
HDL: 42 mg/dL (ref >39.00)
LDL CALC: 108 mg/dL — AB (ref 0–99)
NonHDL: 119.32
TRIGLYCERIDES: 57 mg/dL (ref 0.0–149.0)
VLDL: 11.4 mg/dL (ref 0.0–40.0)

## 2015-12-27 LAB — BASIC METABOLIC PANEL
BUN: 19 mg/dL (ref 6–23)
CALCIUM: 10 mg/dL (ref 8.4–10.5)
CHLORIDE: 98 meq/L (ref 96–112)
CO2: 37 meq/L — AB (ref 19–32)
Creatinine, Ser: 0.57 mg/dL (ref 0.40–1.20)
GFR: 114.49 mL/min (ref >60.00)
GLUCOSE: 103 mg/dL — AB (ref 70–99)
POTASSIUM: 4.8 meq/L (ref 3.5–5.1)
SODIUM: 141 meq/L (ref 135–145)

## 2015-12-27 LAB — HEPATIC FUNCTION PANEL
ALT: 19 U/L (ref 0–35)
AST: 17 U/L (ref 0–37)
Albumin: 4.4 g/dL (ref 3.5–5.2)
Alkaline Phosphatase: 44 U/L (ref 39–117)
BILIRUBIN DIRECT: 0.1 mg/dL (ref 0.0–0.3)
BILIRUBIN TOTAL: 0.4 mg/dL (ref 0.2–1.2)
Total Protein: 7.1 g/dL (ref 6.0–8.3)

## 2015-12-27 LAB — TSH: TSH: 2.75 u[IU]/mL (ref 0.35–4.50)

## 2016-01-05 ENCOUNTER — Ambulatory Visit (INDEPENDENT_AMBULATORY_CARE_PROVIDER_SITE_OTHER): Payer: BLUE CROSS/BLUE SHIELD | Admitting: Family Medicine

## 2016-01-05 VITALS — BP 138/80 | HR 96 | Temp 97.9°F | Ht 63.0 in | Wt 149.7 lb

## 2016-01-05 DIAGNOSIS — Z23 Encounter for immunization: Secondary | ICD-10-CM | POA: Diagnosis not present

## 2016-01-05 DIAGNOSIS — Z0000 Encounter for general adult medical examination without abnormal findings: Secondary | ICD-10-CM

## 2016-01-05 NOTE — Addendum Note (Signed)
Addended by: Tempie HoistMCNEIL, Zanyiah Posten M on: 01/05/2016 12:17 PM   Modules accepted: Kipp BroodSmartSet

## 2016-01-05 NOTE — Progress Notes (Signed)
Subjective:    Patient ID: Bridget Flynn, female    DOB: August 13, 1956, 60 y.o.   MRN: [id number]  HPI Patient here for physical exam. She sees gynecologist had recent mammogram and Pap smear reportedly normal. Nonsmoker. Recent started exercising more consistently. Colonoscopy up-to-date. Tetanus up-to-date. Needs shingles vaccine. No contraindications.  Hyperlipidemia treated with Crestor. She has history of mild leg edema and hypertension treated with HCTZ 25 mg one half tablet twice daily.  Past Medical History  Diagnosis Date  . VITAMIN D DEFICIENCY 07/12/2009    Qualifier: Diagnosis of  By: Gabriel RungNeckers LPN, Harriett SineNancy    . HYPERLIPIDEMIA 07/12/2009    Qualifier: Diagnosis of  By: Gabriel RungNeckers LPN, Harriett SineNancy    . SINUSITIS- ACUTE-NOS 01/13/2010    Qualifier: Diagnosis of  By: Cato MulliganSwords MD, Jahliyah Trice    . ALLERGIC RHINITIS 11/25/2007    Qualifier: Diagnosis of  By: Yetta BarreJones CNA/MA, Shanda BumpsJessica    . ASTHMA 11/25/2007    Qualifier: Diagnosis of  By: Yetta BarreJones CNA/MA, Shanda BumpsJessica    . HYPOGLYCEMIA, HX OF 05/06/2009    Qualifier: Diagnosis of  By: Linna DarnerWyrick, CMA, Cindy    . Asthma    Past Surgical History  Procedure Laterality Date  . Colonoscopy w/ polypectomy  2004  . Laparoscopic bilateral salpingectomy  08/24/08  . Dilation and curettage of uterus      reports that she has never smoked. She has never used smokeless tobacco. She reports that she drinks about 0.5 oz of alcohol per week. She reports that she does not use illicit drugs. family history includes Cancer in her father, mother, and other; Diabetes in her father, mother, and other. Allergies  Allergen Reactions  . Allegra [Fexofenadine Hcl]     Depression, cryingspells  . Biaxin [Clarithromycin]     REACTION: diarrhea, vomiting  . Erythromycin     REACTION: GI upset  . Lipitor [Atorvastatin] Other (See Comments)    arthralgias  . Sulfa Antibiotics       Review of Systems  Constitutional: Negative for fever, activity change, appetite change, fatigue and  unexpected weight change.  HENT: Negative for ear pain, hearing loss, sore throat and trouble swallowing.   Eyes: Negative for visual disturbance.  Respiratory: Negative for cough and shortness of breath.   Cardiovascular: Negative for chest pain and palpitations.  Gastrointestinal: Negative for abdominal pain, diarrhea, constipation and blood in stool.  Genitourinary: Negative for dysuria and hematuria.  Musculoskeletal: Negative for myalgias, back pain and arthralgias.  Skin: Negative for rash.  Neurological: Negative for dizziness, syncope and headaches.  Hematological: Negative for adenopathy.  Psychiatric/Behavioral: Negative for confusion and dysphoric mood.       Objective:   Physical Exam  Constitutional: She is oriented to person, place, and time. She appears well-developed and well-nourished.  HENT:  Head: Normocephalic and atraumatic.  Eyes: EOM are normal. Pupils are equal, round, and reactive to light.  Neck: Normal range of motion. Neck supple. No thyromegaly present.  Cardiovascular: Normal rate, regular rhythm and normal heart sounds.   No murmur heard. Pulmonary/Chest: Breath sounds normal. No respiratory distress. She has no wheezes. She has no rales.  Abdominal: Soft. Bowel sounds are normal. She exhibits no distension and no mass. There is no tenderness. There is no rebound and no guarding.  Genitourinary:  Per gyn  Musculoskeletal: Normal range of motion. She exhibits no edema.  Lymphadenopathy:    She has no cervical adenopathy.  Neurological: She is alert and oriented to person, place, and time. She  displays normal reflexes. No cranial nerve deficit.  Skin: No rash noted.  Psychiatric: She has a normal mood and affect. Her behavior is normal. Judgment and thought content normal.          Assessment & Plan:  Physical exam. Initial blood pressure 146/90 and repeat after rest left arm seated 138/80. Continue weight loss efforts and regular exercise.  Shingles vaccine given. Continue Crestor for hyperlipidemia. She will continue GYN follow-up. Repeat colonoscopy by next year

## 2016-01-05 NOTE — Progress Notes (Signed)
Pre visit review using our clinic review tool, if applicable. No additional management support is needed unless otherwise documented below in the visit note. 

## 2016-04-21 ENCOUNTER — Other Ambulatory Visit: Payer: Self-pay | Admitting: Family Medicine

## 2016-07-03 ENCOUNTER — Encounter: Payer: Self-pay | Admitting: Family Medicine

## 2016-07-03 ENCOUNTER — Ambulatory Visit (INDEPENDENT_AMBULATORY_CARE_PROVIDER_SITE_OTHER): Payer: BLUE CROSS/BLUE SHIELD | Admitting: Family Medicine

## 2016-07-03 VITALS — BP 140/86 | HR 61 | Temp 98.3°F | Ht 63.0 in | Wt 149.5 lb

## 2016-07-03 DIAGNOSIS — R631 Polydipsia: Secondary | ICD-10-CM

## 2016-07-03 DIAGNOSIS — Z23 Encounter for immunization: Secondary | ICD-10-CM

## 2016-07-03 DIAGNOSIS — R002 Palpitations: Secondary | ICD-10-CM

## 2016-07-03 LAB — GLUCOSE, POCT (MANUAL RESULT ENTRY): POC Glucose: 95 mg/dl (ref 70–99)

## 2016-07-03 LAB — POCT GLYCOSYLATED HEMOGLOBIN (HGB A1C): Hemoglobin A1C: 5.3

## 2016-07-03 NOTE — Progress Notes (Signed)
Pre visit review using our clinic review tool, if applicable. No additional management support is needed unless otherwise documented below in the visit note. 

## 2016-07-03 NOTE — Patient Instructions (Signed)
Follow up for any exertional chest pain or progressive shortness of breath with exercise.

## 2016-07-03 NOTE — Progress Notes (Signed)
Subjective:     Patient ID: Bridget Flynn, female   DOB: 1956-05-17, 60 y.o.   MRN: [id number]  HPI Patient seen for the following issues:  Concern for possible diabetes though she has never had any prior major abnormalities with glucose. They have an old glucose monitor at home and she recently had some levels ranging between 114 and to 200 fasting. She's had some polydipsia but no polyuria and no weight loss. She has gained some weight recently.  Patient also relates couple episodes recently where she's had some palpitations. Last summer she was hiking and noticed that her heart felt like it was "pounding" she has some associated dyspnea with palpitation. Sensation of tachycardia and shortness of breath but symptoms are very inconsistent. She's never had exertional chest pain. She does not consistently had symptoms with things like climbing stairs. She has no cardiac history. She does not drink much caffeine.  Past Medical History:  Diagnosis Date  . ALLERGIC RHINITIS 11/25/2007   Qualifier: Diagnosis of  By: Yetta BarreJones CNA/MA, Shanda BumpsJessica    . ASTHMA 11/25/2007   Qualifier: Diagnosis of  By: Yetta BarreJones CNA/MA, Shanda BumpsJessica    . Asthma   . HYPERLIPIDEMIA 07/12/2009   Qualifier: Diagnosis of  By: Gabriel RungNeckers LPN, Harriett SineNancy    . HYPOGLYCEMIA, HX OF 05/06/2009   Qualifier: Diagnosis of  By: Linna DarnerWyrick, CMA, Cindy    . SINUSITIS- ACUTE-NOS 01/13/2010   Qualifier: Diagnosis of  By: Cato MulliganSwords MD, Concepcion Gillott    . VITAMIN D DEFICIENCY 07/12/2009   Qualifier: Diagnosis of  By: Gabriel RungNeckers LPN, Harriett SineNancy     Past Surgical History:  Procedure Laterality Date  . COLONOSCOPY W/ POLYPECTOMY  2004  . DILATION AND CURETTAGE OF UTERUS    . LAPAROSCOPIC BILATERAL SALPINGECTOMY  08/24/08    reports that she has never smoked. She has never used smokeless tobacco. She reports that she drinks about 0.5 oz of alcohol per week . She reports that she does not use drugs. family history includes Cancer in her father, mother, and other; Diabetes in her father,  mother, and other. Allergies  Allergen Reactions  . Allegra [Fexofenadine Hcl]     Depression, cryingspells  . Biaxin [Clarithromycin]     REACTION: diarrhea, vomiting  . Erythromycin     REACTION: GI upset  . Lipitor [Atorvastatin] Other (See Comments)    arthralgias  . Sulfa Antibiotics      Review of Systems  Constitutional: Negative for appetite change, chills, fever and unexpected weight change.  Respiratory: Negative for cough.   Cardiovascular: Positive for palpitations. Negative for chest pain and leg swelling.  Gastrointestinal: Negative for abdominal pain.  Endocrine: Positive for polydipsia. Negative for polyuria.       Objective:   Physical Exam  Constitutional: She appears well-developed and well-nourished.  Neck: Neck supple. No thyromegaly present.  Cardiovascular: Normal rate and regular rhythm.  Exam reveals no gallop.   No murmur heard. Pulmonary/Chest: Effort normal and breath sounds normal. No respiratory distress. She has no wheezes. She has no rales.  Musculoskeletal: She exhibits no edema.  Lymphadenopathy:    She has no cervical adenopathy.       Assessment:     #1 polydipsia.  Patient concern for possible diabetes.  A1C 5.3% and fasting glucose 95- so no evidence for Type 2 diabetes.  #2 palpitations.  EKG NSR with no acute changes.    Plan:     -Check glucose and hemoglobin A1c (results as above) -Check EKG-NSR with no acute findings. -  minimized caffeine use -follow up promptly for any exertional chest pain.  Kristian Covey MD [facility]

## 2016-11-09 ENCOUNTER — Other Ambulatory Visit: Payer: Self-pay | Admitting: Family Medicine

## 2016-11-09 DIAGNOSIS — Z1231 Encounter for screening mammogram for malignant neoplasm of breast: Secondary | ICD-10-CM

## 2016-11-21 ENCOUNTER — Other Ambulatory Visit: Payer: Self-pay | Admitting: Family Medicine

## 2016-12-04 ENCOUNTER — Ambulatory Visit: Payer: BLUE CROSS/BLUE SHIELD

## 2016-12-21 ENCOUNTER — Ambulatory Visit
Admission: RE | Admit: 2016-12-21 | Discharge: 2016-12-21 | Disposition: A | Discharge status: Home / Self Care | Payer: BLUE CROSS/BLUE SHIELD | Source: Ambulatory Visit | Attending: Family Medicine | Admitting: Family Medicine

## 2016-12-21 DIAGNOSIS — Z1231 Encounter for screening mammogram for malignant neoplasm of breast: Secondary | ICD-10-CM

## 2016-12-26 ENCOUNTER — Ambulatory Visit (INDEPENDENT_AMBULATORY_CARE_PROVIDER_SITE_OTHER): Payer: BLUE CROSS/BLUE SHIELD | Admitting: Obstetrics & Gynecology

## 2016-12-26 ENCOUNTER — Encounter: Payer: Self-pay | Admitting: Obstetrics & Gynecology

## 2016-12-26 VITALS — BP 112/80 | HR 78 | Resp 12 | Ht 63.25 in | Wt 151.0 lb

## 2016-12-26 DIAGNOSIS — Z01419 Encounter for gynecological examination (general) (routine) without abnormal findings: Secondary | ICD-10-CM | POA: Diagnosis not present

## 2016-12-26 DIAGNOSIS — Z0000 Encounter for general adult medical examination without abnormal findings: Secondary | ICD-10-CM

## 2016-12-26 LAB — COMPREHENSIVE METABOLIC PANEL
ALK PHOS: 46 U/L (ref 33–130)
ALT: 21 U/L (ref 6–29)
AST: 23 U/L (ref 10–35)
Albumin: 4.5 g/dL (ref 3.6–5.1)
BILIRUBIN TOTAL: 0.4 mg/dL (ref 0.2–1.2)
BUN: 14 mg/dL (ref 7–25)
CALCIUM: 10 mg/dL (ref 8.6–10.4)
CO2: 30 mmol/L (ref 20–31)
Chloride: 97 mmol/L — ABNORMAL LOW (ref 98–110)
Creat: 0.77 mg/dL (ref 0.50–0.99)
Glucose, Bld: 88 mg/dL (ref 65–99)
POTASSIUM: 4 mmol/L (ref 3.5–5.3)
Sodium: 139 mmol/L (ref 135–146)
TOTAL PROTEIN: 7.4 g/dL (ref 6.1–8.1)

## 2016-12-26 LAB — LIPID PANEL
CHOL/HDL RATIO: 3.6 ratio (ref <5.0)
CHOLESTEROL: 182 mg/dL (ref <200)
HDL: 50 mg/dL — ABNORMAL LOW (ref >50)
LDL CALC: 100 mg/dL — AB (ref <100)
Triglycerides: 158 mg/dL — ABNORMAL HIGH (ref <150)
VLDL: 32 mg/dL — AB (ref <30)

## 2016-12-26 LAB — TSH: TSH: 3.37 m[IU]/L

## 2016-12-26 LAB — CBC
HEMATOCRIT: 42.3 % (ref 35.0–45.0)
HEMOGLOBIN: 14.1 g/dL (ref 11.7–15.5)
MCH: 29.2 pg (ref 27.0–33.0)
MCHC: 33.3 g/dL (ref 32.0–36.0)
MCV: 87.6 fL (ref 80.0–100.0)
MPV: 9.9 fL (ref 7.5–12.5)
Platelets: 328 10*3/uL (ref 140–400)
RBC: 4.83 MIL/uL (ref 3.80–5.10)
RDW: 13.2 % (ref 11.0–15.0)
WBC: 9.1 10*3/uL (ref 3.8–10.8)

## 2016-12-26 NOTE — Progress Notes (Signed)
61 y.o. G20P0012 Married Caucasian F here for annual exam.  Reports she is doing well.  Reports last year she started checking her blood sugars due to feeling like maybe she was having some low glucose levels.  She started checking her blood sugars but was using an old glucometer.  Levels were elevated.  HbA1C levels were normal.  Turned out the glucometer was giving abnormal readings that were false.  Husband had bilateral knee injury due to a fall.  They were out of town for Thanksgiving.  He is finally walking now.  Patient complains of fatigue.  She feels some of this is due to the sleepless nights she had with her husband.    Patient's last menstrual period was 10/26/2005.          Sexually active: Yes.    The current method of family planning is BSO.    Exercising: Yes.    Rowing machine, Walking Smoker:  no  Health Maintenance: Pap:  09/02/15 negative, HR HPV negative; 08/01/11 WNL neg HR HPV History of abnormal Pap:  no MMG:  12/21/16 BIRADS 1 negative  Colonoscopy: 05/14/2006, Dr. Sharlett Iles. WNL. Repeat 10 yrs. (Knows needs to reschedule) BMD:   11/15/15 osteopenia TDaP:  08/03/09 Pneumonia vaccine(s):  12/08/11  Zostavax:   01/05/16  Hep C testing: 09/02/15 negative  Screening Labs: PCP, Hb today: PCP   reports that she has never smoked. She has never used smokeless tobacco. She reports that she drinks about 0.5 oz of alcohol per week . She reports that she does not use drugs.  Past Medical History:  Diagnosis Date  . ALLERGIC RHINITIS 11/25/2007   Qualifier: Diagnosis of  By: Ronnald Ramp CNA/MA, Janett Billow    . ASTHMA 11/25/2007   Qualifier: Diagnosis of  By: Ronnald Ramp CNA/MA, Janett Billow    . Asthma   . HYPERLIPIDEMIA 07/12/2009   Qualifier: Diagnosis of  By: Valma Cava LPN, Izora Gala    . HYPOGLYCEMIA, HX OF 05/06/2009   Qualifier: Diagnosis of  By: Sherlynn Stalls, CMA, Cindy    . SINUSITIS- ACUTE-NOS 01/13/2010   Qualifier: Diagnosis of  By: Leanne Chang MD, Bruce    . VITAMIN D DEFICIENCY 07/12/2009   Qualifier:  Diagnosis of  By: Valma Cava LPN, Izora Gala      Past Surgical History:  Procedure Laterality Date  . COLONOSCOPY W/ POLYPECTOMY  2004  . DILATION AND CURETTAGE OF UTERUS    . LAPAROSCOPIC BILATERAL SALPINGECTOMY  08/24/08    Current Outpatient Prescriptions  Medication Sig Dispense Refill  . albuterol (PROVENTIL HFA) 108 (90 BASE) MCG/ACT inhaler Inhale 2 puffs into the lungs every 6 (six) hours as needed. 1 Inhaler 0  . B Complex Vitamins (VITAMIN-B COMPLEX PO) Take by mouth daily. Takes only when fatigued.    . Chlorpheniramine Maleate (CHLOR-TABLETS PO) Take by mouth.    . Cholecalciferol (VITAMIN D3) 1000 UNITS CAPS Take by mouth 3 (three) times a week. Takes once per week.    Marland Kitchen Fexofenadine HCl (MUCINEX ALLERGY PO) Take by mouth.    . fluticasone (FLONASE) 50 MCG/ACT nasal spray Place into both nostrils daily.    . hydrochlorothiazide (HYDRODIURIL) 25 MG tablet TAKE 1 TABLET BY MOUTH EVERY DAY AS NEEDED 90 tablet 2  . naproxen (NAPROSYN) 500 MG tablet Reported on 12/06/2015    . rosuvastatin (CRESTOR) 20 MG tablet Take 1 tablet (20 mg total) by mouth daily. 90 tablet 3   No current facility-administered medications for this visit.     Family History  Problem Relation Age of  Onset  . Diabetes Mother   . Cancer Mother     ovarian cancer  . Diabetes Father   . Cancer Father     Hodgkins lymphoma  . Diabetes Other   . Cancer Other     ovarian  . BRCA 1/2 Neg Hx     ROS:  Pertinent items are noted in HPI.  Otherwise, a comprehensive ROS was negative.  Exam:   BP 112/80 (BP Location: Right Arm, Patient Position: Sitting, Cuff Size: Normal)   Pulse 78   Resp 12   Ht 5' 3.25" (1.607 m)   Wt 151 lb (68.5 kg)   LMP 10/26/2005   BMI 26.54 kg/m   Weight change: -3#   Height: 5' 3.25" (160.7 cm)  Ht Readings from Last 3 Encounters:  12/26/16 5' 3.25" (1.607 m)  07/03/16 5' 3"  (1.6 m)  01/05/16 5' 3"  (1.6 m)    General appearance: alert, cooperative and appears stated  age Head: Normocephalic, without obvious abnormality, atraumatic Neck: no adenopathy, supple, symmetrical, trachea midline and thyroid normal to inspection and palpation Lungs: clear to auscultation bilaterally Breasts: normal appearance, no masses or tenderness Heart: regular rate and rhythm Abdomen: soft, non-tender; bowel sounds normal; no masses,  no organomegaly Extremities: extremities normal, atraumatic, no cyanosis or edema Skin: Skin color, texture, turgor normal. No rashes or lesions Lymph nodes: Cervical, supraclavicular, and axillary nodes normal. No abnormal inguinal nodes palpated Neurologic: Grossly normal   Pelvic: External genitalia:  no lesions              Urethra:  normal appearing urethra with no masses, tenderness or lesions              Bartholins and Skenes: normal                 Vagina: normal appearing vagina with normal color and discharge, no lesions              Cervix: no lesions              Pap taken: No. Bimanual Exam:  Uterus:  normal size, contour, position, consistency, mobility, non-tender              Adnexa: normal adnexa and no mass, fullness, tenderness               Rectovaginal: Confirms               Anus:  normal sphincter tone, no lesions  Chaperone was present for exam.  A:  Well Woman with normal exam PMP, no HRT H/O asthma Hypertension Osteopenia Fatigue  P:   Mammogram guidelines reviewed pap smear and HR HPV neg 12/16 CMP, lipids, TSH, CBC, Vit D Pt aware colonoscopy due this year.  Pt knows but could not get it done due to husband's injury.  Planning to do this year. return annually or prn

## 2016-12-27 LAB — VITAMIN D 25 HYDROXY (VIT D DEFICIENCY, FRACTURES): Vit D, 25-Hydroxy: 37 ng/mL (ref 30–100)

## 2016-12-28 DIAGNOSIS — M436 Torticollis: Secondary | ICD-10-CM | POA: Diagnosis not present

## 2016-12-28 DIAGNOSIS — M542 Cervicalgia: Secondary | ICD-10-CM | POA: Diagnosis not present

## 2017-01-04 DIAGNOSIS — M542 Cervicalgia: Secondary | ICD-10-CM | POA: Diagnosis not present

## 2017-01-04 DIAGNOSIS — M436 Torticollis: Secondary | ICD-10-CM | POA: Diagnosis not present

## 2017-01-11 DIAGNOSIS — M542 Cervicalgia: Secondary | ICD-10-CM | POA: Diagnosis not present

## 2017-01-11 DIAGNOSIS — M436 Torticollis: Secondary | ICD-10-CM | POA: Diagnosis not present

## 2017-01-16 DIAGNOSIS — M542 Cervicalgia: Secondary | ICD-10-CM | POA: Diagnosis not present

## 2017-01-16 DIAGNOSIS — M436 Torticollis: Secondary | ICD-10-CM | POA: Diagnosis not present

## 2017-01-25 DIAGNOSIS — M542 Cervicalgia: Secondary | ICD-10-CM | POA: Diagnosis not present

## 2017-01-25 DIAGNOSIS — M436 Torticollis: Secondary | ICD-10-CM | POA: Diagnosis not present

## 2017-01-30 DIAGNOSIS — M436 Torticollis: Secondary | ICD-10-CM | POA: Diagnosis not present

## 2017-01-30 DIAGNOSIS — M542 Cervicalgia: Secondary | ICD-10-CM | POA: Diagnosis not present

## 2017-02-15 DIAGNOSIS — M542 Cervicalgia: Secondary | ICD-10-CM | POA: Diagnosis not present

## 2017-02-15 DIAGNOSIS — M436 Torticollis: Secondary | ICD-10-CM | POA: Diagnosis not present

## 2017-02-21 DIAGNOSIS — M436 Torticollis: Secondary | ICD-10-CM | POA: Diagnosis not present

## 2017-02-21 DIAGNOSIS — M542 Cervicalgia: Secondary | ICD-10-CM | POA: Diagnosis not present

## 2017-04-03 DIAGNOSIS — M542 Cervicalgia: Secondary | ICD-10-CM | POA: Diagnosis not present

## 2017-04-10 DIAGNOSIS — M436 Torticollis: Secondary | ICD-10-CM | POA: Diagnosis not present

## 2017-04-10 DIAGNOSIS — M542 Cervicalgia: Secondary | ICD-10-CM | POA: Diagnosis not present

## 2017-04-17 DIAGNOSIS — M436 Torticollis: Secondary | ICD-10-CM | POA: Diagnosis not present

## 2017-04-17 DIAGNOSIS — M542 Cervicalgia: Secondary | ICD-10-CM | POA: Diagnosis not present

## 2017-04-24 DIAGNOSIS — M542 Cervicalgia: Secondary | ICD-10-CM | POA: Diagnosis not present

## 2017-04-24 DIAGNOSIS — M436 Torticollis: Secondary | ICD-10-CM | POA: Diagnosis not present

## 2017-04-25 ENCOUNTER — Other Ambulatory Visit: Payer: Self-pay | Admitting: Family Medicine

## 2017-04-25 ENCOUNTER — Encounter: Payer: Self-pay | Admitting: Family Medicine

## 2017-05-15 DIAGNOSIS — M436 Torticollis: Secondary | ICD-10-CM | POA: Diagnosis not present

## 2017-05-15 DIAGNOSIS — M542 Cervicalgia: Secondary | ICD-10-CM | POA: Diagnosis not present

## 2017-05-22 DIAGNOSIS — M542 Cervicalgia: Secondary | ICD-10-CM | POA: Diagnosis not present

## 2017-05-22 DIAGNOSIS — M436 Torticollis: Secondary | ICD-10-CM | POA: Diagnosis not present

## 2017-05-29 DIAGNOSIS — M436 Torticollis: Secondary | ICD-10-CM | POA: Diagnosis not present

## 2017-05-29 DIAGNOSIS — M542 Cervicalgia: Secondary | ICD-10-CM | POA: Diagnosis not present

## 2017-06-05 DIAGNOSIS — M542 Cervicalgia: Secondary | ICD-10-CM | POA: Diagnosis not present

## 2017-06-05 DIAGNOSIS — M436 Torticollis: Secondary | ICD-10-CM | POA: Diagnosis not present

## 2017-06-12 ENCOUNTER — Ambulatory Visit (INDEPENDENT_AMBULATORY_CARE_PROVIDER_SITE_OTHER): Payer: BLUE CROSS/BLUE SHIELD | Admitting: Family Medicine

## 2017-06-12 ENCOUNTER — Encounter: Payer: Self-pay | Admitting: Family Medicine

## 2017-06-12 VITALS — BP 120/78 | HR 73 | Temp 98.9°F | Wt 152.3 lb

## 2017-06-12 DIAGNOSIS — J019 Acute sinusitis, unspecified: Secondary | ICD-10-CM | POA: Diagnosis not present

## 2017-06-12 MED ORDER — AMOXICILLIN-POT CLAVULANATE 875-125 MG PO TABS: 1.0000 | ORAL_TABLET | Freq: Two times a day (BID) | ORAL | 0 refills | Status: DC

## 2017-06-12 NOTE — Patient Instructions (Signed)

## 2017-06-12 NOTE — Progress Notes (Signed)
Subjective:     Patient ID: Bridget Flynn, female   DOB: 31-Jan-1956, 61 y.o.   MRN: [id number]  HPI Patient seen for acute illness. She's had about 3 weeks now of sinus congestion and now productive cough. No fever. She's had almost daily headaches and nasal congestion. Pansinusitis symptoms. Cough productive of green sputum. Intermittent ear pain bilaterally. She has noticed a "fullness" in both ears. Occasional sore throat. No relief with over-the-counter medications.  Past Medical History:  Diagnosis Date  . ALLERGIC RHINITIS 11/25/2007   Qualifier: Diagnosis of  By: Yetta Barre CNA/MA, Shanda Bumps    . ASTHMA 11/25/2007   Qualifier: Diagnosis of  By: Yetta Barre CNA/MA, Shanda Bumps    . Asthma   . HYPERLIPIDEMIA 07/12/2009   Qualifier: Diagnosis of  By: Gabriel Rung LPN, Harriett Sine    . HYPOGLYCEMIA, HX OF 05/06/2009   Qualifier: Diagnosis of  By: Linna Darner, CMA, Cindy    . SINUSITIS- ACUTE-NOS 01/13/2010   Qualifier: Diagnosis of  By: Cato Mulligan MD, Sumeet Geter    . VITAMIN D DEFICIENCY 07/12/2009   Qualifier: Diagnosis of  By: Gabriel Rung LPN, Harriett Sine     Past Surgical History:  Procedure Laterality Date  . COLONOSCOPY W/ POLYPECTOMY  2004  . DILATION AND CURETTAGE OF UTERUS    . LAPAROSCOPIC BILATERAL SALPINGECTOMY  08/24/08    reports that she has never smoked. She has never used smokeless tobacco. She reports that she drinks about 0.5 oz of alcohol per week . She reports that she does not use drugs. family history includes Cancer in her father, mother, and other; Diabetes in her father, mother, and other. Allergies  Allergen Reactions  . Allegra [Fexofenadine Hcl]     Depression, cryingspells  . Biaxin [Clarithromycin]     REACTION: diarrhea, vomiting  . Erythromycin     REACTION: GI upset  . Lipitor [Atorvastatin] Other (See Comments)    arthralgias  . Sulfa Antibiotics      Review of Systems  Constitutional: Negative for chills and fever.  HENT: Positive for congestion, sinus pain and sinus pressure.   Respiratory:  Positive for cough.   Neurological: Positive for headaches.       Objective:   Physical Exam  Constitutional: She appears well-developed and well-nourished.  HENT:  Right Ear: External ear normal.  Left Ear: External ear normal.  Mouth/Throat: Oropharynx is clear and moist.  Neck: Neck supple.  Cardiovascular: Normal rate and regular rhythm.   Pulmonary/Chest: Effort normal and breath sounds normal. No respiratory distress. She has no wheezes. She has no rales.  Lymphadenopathy:    She has no cervical adenopathy.       Assessment:     Probable acute pansinusitis    Plan:     -Start Augmentin 875 mg twice daily -Stay well-hydrated -Consider saline nasal irrigation  Kristian Covey MD [facility] at [facility]

## 2017-06-14 ENCOUNTER — Encounter: Payer: Self-pay | Admitting: Family Medicine

## 2017-06-20 ENCOUNTER — Encounter: Payer: Self-pay | Admitting: Family Medicine

## 2017-06-20 ENCOUNTER — Ambulatory Visit (INDEPENDENT_AMBULATORY_CARE_PROVIDER_SITE_OTHER): Payer: BLUE CROSS/BLUE SHIELD | Admitting: Family Medicine

## 2017-06-20 VITALS — BP 130/90 | HR 75 | Temp 98.5°F | Wt 147.0 lb

## 2017-06-20 DIAGNOSIS — R05 Cough: Secondary | ICD-10-CM | POA: Diagnosis not present

## 2017-06-20 DIAGNOSIS — R062 Wheezing: Secondary | ICD-10-CM

## 2017-06-20 DIAGNOSIS — R059 Cough, unspecified: Secondary | ICD-10-CM

## 2017-06-20 MED ORDER — METHYLPREDNISOLONE ACETATE 80 MG/ML IJ SUSP
80.0000 mg | Freq: Once | INTRAMUSCULAR | Status: AC
  Administered 2017-06-20: 80 mg via INTRAMUSCULAR

## 2017-06-20 NOTE — Patient Instructions (Signed)
Follow up for any fever or increased shortness of breath. 

## 2017-06-20 NOTE — Progress Notes (Signed)
Subjective:     Patient ID: Bridget Flynn, female   DOB: Jul 29, 1956, 61 y.o.   MRN: [id number]  HPI Patient seen for recent sinusitis and is currently on day 8 of Augmentin. She developed some increased productive cough and wheezing past few days. No fever. Overall feels slightly better. Has had some wheezing in the past intermittently. Nonsmoker. Denies any facial pain. No headaches.  Past Medical History:  Diagnosis Date  . ALLERGIC RHINITIS 11/25/2007   Qualifier: Diagnosis of  By: Yetta Barre CNA/MA, Shanda Bumps    . ASTHMA 11/25/2007   Qualifier: Diagnosis of  By: Yetta Barre CNA/MA, Shanda Bumps    . Asthma   . HYPERLIPIDEMIA 07/12/2009   Qualifier: Diagnosis of  By: Gabriel Rung LPN, Harriett Sine    . HYPOGLYCEMIA, HX OF 05/06/2009   Qualifier: Diagnosis of  By: Linna Darner, CMA, Cindy    . SINUSITIS- ACUTE-NOS 01/13/2010   Qualifier: Diagnosis of  By: Cato Mulligan MD, Bruce    . VITAMIN D DEFICIENCY 07/12/2009   Qualifier: Diagnosis of  By: Gabriel Rung LPN, Harriett Sine     Past Surgical History:  Procedure Laterality Date  . COLONOSCOPY W/ POLYPECTOMY  2004  . DILATION AND CURETTAGE OF UTERUS    . LAPAROSCOPIC BILATERAL SALPINGECTOMY  08/24/08    reports that she has never smoked. She has never used smokeless tobacco. She reports that she drinks about 0.5 oz of alcohol per week . She reports that she does not use drugs. family history includes Cancer in her father, mother, and other; Diabetes in her father, mother, and other. Allergies  Allergen Reactions  . Allegra [Fexofenadine Hcl]     Depression, cryingspells  . Biaxin [Clarithromycin]     REACTION: diarrhea, vomiting  . Erythromycin     REACTION: GI upset  . Lipitor [Atorvastatin] Other (See Comments)    arthralgias  . Sulfa Antibiotics      Review of Systems  Constitutional: Negative for chills and fever.  HENT: Positive for congestion, sinus pain and sinus pressure.   Respiratory: Positive for cough and wheezing.        Objective:   Physical Exam   Constitutional: She appears well-developed and well-nourished. No distress.  HENT:  Mouth/Throat: Oropharynx is clear and moist.  Cardiovascular: Normal rate and regular rhythm.   Pulmonary/Chest: Effort normal. She has wheezes. She has no rales.  Musculoskeletal: She exhibits no edema.       Assessment:     Patient presents with some persistent cough and mild reactive airway component on exam    Plan:     -Finish out Augmentin -Depo-Medrol 80 mg IM given. She has been intolerant of oral steroids in the past but has been able to take intramuscular  Kristian Covey MD [facility]

## 2017-06-25 ENCOUNTER — Telehealth: Payer: Self-pay | Admitting: *Deleted

## 2017-06-25 MED ORDER — ALBUTEROL SULFATE HFA 108 (90 BASE) MCG/ACT IN AERS: 2.0000 | INHALATION_SPRAY | Freq: Four times a day (QID) | RESPIRATORY_TRACT | 0 refills | Status: DC | PRN

## 2017-06-25 NOTE — Telephone Encounter (Signed)
[facility] faxed a note requesting a new Rx for Proventil-last rx given in 2016.  Is this OK to refill?

## 2017-06-25 NOTE — Telephone Encounter (Signed)
Refill OK

## 2017-06-25 NOTE — Telephone Encounter (Signed)
Rx done. 

## 2017-06-27 ENCOUNTER — Other Ambulatory Visit: Payer: Self-pay | Admitting: Family Medicine

## 2017-06-27 ENCOUNTER — Ambulatory Visit (INDEPENDENT_AMBULATORY_CARE_PROVIDER_SITE_OTHER): Payer: BLUE CROSS/BLUE SHIELD | Admitting: Family Medicine

## 2017-06-27 ENCOUNTER — Encounter: Payer: Self-pay | Admitting: Family Medicine

## 2017-06-27 VITALS — BP 110/80 | HR 76 | Temp 98.2°F | Wt 151.2 lb

## 2017-06-27 DIAGNOSIS — R05 Cough: Secondary | ICD-10-CM | POA: Diagnosis not present

## 2017-06-27 DIAGNOSIS — R059 Cough, unspecified: Secondary | ICD-10-CM

## 2017-06-27 DIAGNOSIS — R062 Wheezing: Secondary | ICD-10-CM

## 2017-06-27 MED ORDER — BECLOMETHASONE DIPROPIONATE 80 MCG/ACT IN AERS: 2.0000 | INHALATION_SPRAY | Freq: Two times a day (BID) | RESPIRATORY_TRACT | 3 refills | Status: DC

## 2017-06-27 NOTE — Progress Notes (Signed)
Subjective:     Patient ID: Bridget Flynn, female   DOB: 03/19/1956, 61 y.o.   MRN: [id number]  HPI Patient is nonsmoker with history of reported "cough variant asthma ". Recent sinusitis. Overall feels much better after finishing Augmentin. She started back Singulair a few days ago. Still has cough but less wheezing. No fever. She has some laryngitis symptoms past week. Recently received Depo-Medrol. Using albuterol inhaler as needed.  Past Medical History:  Diagnosis Date  . ALLERGIC RHINITIS 11/25/2007   Qualifier: Diagnosis of  By: Yetta Barre CNA/MA, Shanda Bumps    . ASTHMA 11/25/2007   Qualifier: Diagnosis of  By: Yetta Barre CNA/MA, Shanda Bumps    . Asthma   . HYPERLIPIDEMIA 07/12/2009   Qualifier: Diagnosis of  By: Gabriel Rung LPN, Harriett Sine    . HYPOGLYCEMIA, HX OF 05/06/2009   Qualifier: Diagnosis of  By: Linna Darner, CMA, Cindy    . SINUSITIS- ACUTE-NOS 01/13/2010   Qualifier: Diagnosis of  By: Cato Mulligan MD, Bruce    . VITAMIN D DEFICIENCY 07/12/2009   Qualifier: Diagnosis of  By: Gabriel Rung LPN, Harriett Sine     Past Surgical History:  Procedure Laterality Date  . COLONOSCOPY W/ POLYPECTOMY  2004  . DILATION AND CURETTAGE OF UTERUS    . LAPAROSCOPIC BILATERAL SALPINGECTOMY  08/24/08    reports that she has never smoked. She has never used smokeless tobacco. She reports that she drinks about 0.5 oz of alcohol per week . She reports that she does not use drugs. family history includes Cancer in her father, mother, and other; Diabetes in her father, mother, and other. Allergies  Allergen Reactions  . Allegra [Fexofenadine Hcl]     Depression, cryingspells  . Biaxin [Clarithromycin]     REACTION: diarrhea, vomiting  . Erythromycin     REACTION: GI upset  . Lipitor [Atorvastatin] Other (See Comments)    arthralgias  . Sulfa Antibiotics      Review of Systems  Constitutional: Negative for chills and fever.  Respiratory: Positive for cough. Negative for shortness of breath.        Objective:   Physical Exam   Constitutional: She appears well-developed and well-nourished.  Cardiovascular: Normal rate and regular rhythm.   Pulmonary/Chest: Effort normal and breath sounds normal. No respiratory distress. She has no wheezes. She has no rales.       Assessment:     Recent acute sinusitis with reactive airway component. No wheezing noted on exam at this time. History of reported cough. Asthma    Plan:     -Continue albuterol as needed -We printed prescription for Qvar 80 two puffs twice daily if cough and wheezing persist and then try to taper off gradually -Touch base if hoarseness not resolving in the next week or 2 and also immediately for any fever or other changes  Kristian Covey MD [facility] at [facility]

## 2017-07-31 ENCOUNTER — Other Ambulatory Visit: Payer: Self-pay | Admitting: Family Medicine

## 2017-07-31 NOTE — Telephone Encounter (Signed)
Error on my end pt does take this medication and it is listed on pt's medication list. Rx was sent.

## 2017-07-31 NOTE — Telephone Encounter (Signed)
Do not see this on pt's current medication list. Nor has this been Rx'd by Dr. Clent RidgesFry before. Call pt is she taking this medication?

## 2017-08-24 ENCOUNTER — Ambulatory Visit (INDEPENDENT_AMBULATORY_CARE_PROVIDER_SITE_OTHER): Payer: BLUE CROSS/BLUE SHIELD

## 2017-08-24 ENCOUNTER — Ambulatory Visit: Payer: BLUE CROSS/BLUE SHIELD

## 2017-08-24 DIAGNOSIS — Z23 Encounter for immunization: Secondary | ICD-10-CM | POA: Diagnosis not present

## 2017-10-23 ENCOUNTER — Encounter: Payer: Self-pay | Admitting: Family Medicine

## 2017-10-23 ENCOUNTER — Ambulatory Visit: Payer: BLUE CROSS/BLUE SHIELD | Admitting: Family Medicine

## 2017-10-23 VITALS — BP 110/80 | HR 77 | Temp 99.3°F | Resp 16 | Ht 63.0 in | Wt 153.6 lb

## 2017-10-23 DIAGNOSIS — R509 Fever, unspecified: Secondary | ICD-10-CM

## 2017-10-23 DIAGNOSIS — R52 Pain, unspecified: Secondary | ICD-10-CM

## 2017-10-23 DIAGNOSIS — J101 Influenza due to other identified influenza virus with other respiratory manifestations: Secondary | ICD-10-CM | POA: Diagnosis not present

## 2017-10-23 LAB — POCT INFLUENZA A/B
Influenza A, POC: POSITIVE — AB
Influenza B, POC: NEGATIVE

## 2017-10-23 MED ORDER — OSELTAMIVIR PHOSPHATE 75 MG PO CAPS: 75.0000 mg | ORAL_CAPSULE | Freq: Two times a day (BID) | ORAL | 0 refills | Status: AC

## 2017-10-23 NOTE — Patient Instructions (Signed)
A few things to remember from today's visit:   Fever, unspecified fever cause - Plan: POCT Influenza A/B  Generalized body aches - Plan: POCT Influenza A/B  Influenza A   Influenza, Adult Influenza ("the flu") is an infection in the lungs, nose, and throat (respiratory tract). It is caused by a virus. The flu causes many common cold symptoms, as well as a high fever and body aches. It can make you feel very sick. The flu spreads easily from person to person (is contagious). Getting a flu shot (influenza vaccination) every year is the best way to prevent the flu. Follow these instructions at home:  Take over-the-counter and prescription medicines only as told by your doctor.  Use a cool mist humidifier to add moisture (humidity) to the air in your home. This can make it easier to breathe.  Rest as needed.  Drink enough fluid to keep your pee (urine) clear or pale yellow.  Cover your mouth and nose when you cough or sneeze.  Wash your hands with soap and water often, especially after you cough or sneeze. If you cannot use soap and water, use hand sanitizer.  Stay home from work or school as told by your doctor. Unless you are visiting your doctor, try to avoid leaving home until your fever has been gone for 24 hours without the use of medicine.  Keep all follow-up visits as told by your doctor. This is important. How is this prevented?  Getting a yearly (annual) flu shot is the best way to avoid getting the flu. You may get the flu shot in late summer, fall, or winter. Ask your doctor when you should get your flu shot.  Wash your hands often or use hand sanitizer often.  Avoid contact with people who are sick during cold and flu season.  Eat healthy foods.  Drink plenty of fluids.  Get enough sleep.  Exercise regularly. Contact a doctor if:  You get new symptoms.  You have: ? Chest pain. ? Watery poop (diarrhea). ? A fever.  Your cough gets worse.  You start to  have more mucus.  You feel sick to your stomach (nauseous).  You throw up (vomit). Get help right away if:  You start to be short of breath or have trouble breathing.  Your skin or nails turn a bluish color.  You have very bad pain or stiffness in your neck.  You get a sudden headache.  You get sudden pain in your face or ear.  You cannot stop throwing up. This information is not intended to replace advice given to you by your health care provider. Make sure you discuss any questions you have with your health care provider. Document Released: 06/20/2008 Document Revised: 02/17/2016 Document Reviewed: 07/06/2015 Elsevier Interactive Patient Education  2017 [employer]  Please be sure medication list is accurate. If a new problem present, please set up appointment sooner than planned today.

## 2017-10-23 NOTE — Progress Notes (Signed)
ACUTE VISIT  HPI:  Chief Complaint  Patient presents with  . Cough    Sx started 3days ago  . Generalized Body Aches  . Fever    102-103.3 a day ago  . Nasal Congestion    sx started a week ago    [facility] is a 62 y.o.female here today complaining of of respiratory symptoms since Sunday night, 2-3 days. She thinks she may have a "sinus infection." She has had mild cough, which seems to be improving. Global pressure headache with no associated visual changes, nausea, vomiting, or focal deficit.   URI   This is a new problem. The current episode started in the past 7 days. The problem has been unchanged. The maximum temperature recorded prior to her arrival was 103 - 104 F. Associated symptoms include congestion, coughing, headaches, neck pain, rhinorrhea and a sore throat. Pertinent negatives include no abdominal pain, chest pain, diarrhea, dysuria, ear pain, joint swelling, nausea, plugged ear sensation, rash, sneezing, swollen glands, vomiting or wheezing. She has tried NSAIDs for the symptoms. The treatment provided mild relief.   Ear "dull/burning" like sensation,bilateral. No hearing loss.  No Hx of recent travel. No sick contact. No known insect bite.  Hx of allergies: Yes, "bad." Hx of asthma and allergic rhinitis.   Symptoms otherwise stable.   Review of Systems  Constitutional: Positive for activity change, appetite change, chills, fatigue and fever.  HENT: Positive for congestion, postnasal drip, rhinorrhea, sinus pressure and sore throat. Negative for ear pain, facial swelling, mouth sores, sneezing, trouble swallowing and voice change.   Eyes: Negative for discharge, redness and itching.  Respiratory: Positive for cough. Negative for shortness of breath and wheezing.   Cardiovascular: Negative for chest pain and leg swelling.  Gastrointestinal: Negative for abdominal pain, diarrhea, nausea and vomiting.  Genitourinary: Negative for decreased  urine volume, dysuria and hematuria.  Musculoskeletal: Positive for myalgias and neck pain. Negative for gait problem.  Skin: Negative for rash.  Allergic/Immunologic: Positive for environmental allergies.  Neurological: Positive for headaches. Negative for syncope, weakness and numbness.  Hematological: Negative for adenopathy. Does not bruise/bleed easily.  Psychiatric/Behavioral: Negative for confusion. The patient is nervous/anxious.       Current Outpatient Medications on File Prior to Visit  Medication Sig Dispense Refill  . albuterol (PROVENTIL HFA) 108 (90 Base) MCG/ACT inhaler Inhale 2 puffs into the lungs every 6 (six) hours as needed. 1 Inhaler 0  . B Complex Vitamins (VITAMIN-B COMPLEX PO) Take by mouth daily. Takes only when fatigued.    . Chlorpheniramine Maleate (CHLOR-TABLETS PO) Take by mouth.    . Cholecalciferol (VITAMIN D3) 1000 UNITS CAPS Take by mouth 3 (three) times a week. Takes once per week.    Marland Kitchen Fexofenadine HCl (MUCINEX ALLERGY PO) Take by mouth.    . fluticasone (FLONASE) 50 MCG/ACT nasal spray Place into both nostrils daily.    . hydrochlorothiazide (HYDRODIURIL) 25 MG tablet TAKE 1 TABLET BY MOUTH EVERY DAY AS NEEDED 90 tablet 2  . naproxen (NAPROSYN) 500 MG tablet Reported on 12/06/2015    . rosuvastatin (CRESTOR) 20 MG tablet TAKE 1 TABLET BY MOUTH EVERY DAY 90 tablet 0  . beclomethasone (QVAR) 80 MCG/ACT inhaler Inhale 2 puffs into the lungs 2 (two) times daily. (Patient not taking: Reported on 10/23/2017) 1 Inhaler 3  . montelukast (SINGULAIR) 10 MG tablet Take 10 mg by mouth at bedtime.     No current facility-administered medications on file  prior to visit.      Past Medical History:  Diagnosis Date  . ALLERGIC RHINITIS 11/25/2007   Qualifier: Diagnosis of  By: Yetta BarreJones CNA/MA, Shanda BumpsJessica    . ASTHMA 11/25/2007   Qualifier: Diagnosis of  By: Yetta BarreJones CNA/MA, Shanda BumpsJessica    . Asthma   . HYPERLIPIDEMIA 07/12/2009   Qualifier: Diagnosis of  By: Gabriel RungNeckers LPN, Harriett SineNancy     . HYPOGLYCEMIA, HX OF 05/06/2009   Qualifier: Diagnosis of  By: Linna DarnerWyrick, CMA, Cindy    . SINUSITIS- ACUTE-NOS 01/13/2010   Qualifier: Diagnosis of  By: Cato MulliganSwords MD, Bruce    . VITAMIN D DEFICIENCY 07/12/2009   Qualifier: Diagnosis of  By: Gabriel RungNeckers LPN, Harriett SineNancy     Allergies  Allergen Reactions  . Allegra [Fexofenadine Hcl]     Depression, cryingspells  . Biaxin [Clarithromycin]     REACTION: diarrhea, vomiting  . Erythromycin     REACTION: GI upset  . Lipitor [Atorvastatin] Other (See Comments)    arthralgias  . Sulfa Antibiotics     Social History   Socioeconomic History  . Marital status: Married    Spouse name: None  . Number of children: None  . Years of education: None  . Highest education level: None  Social Needs  . Financial resource strain: None  . Food insecurity - worry: None  . Food insecurity - inability: None  . Transportation needs - medical: None  . Transportation needs - non-medical: None  Occupational History  . None  Tobacco Use  . Smoking status: Never Smoker  . Smokeless tobacco: Never Used  Substance and Sexual Activity  . Alcohol use: Yes    Alcohol/week: 0.5 oz    Types: 1 drink(s) per week    Comment: occ glass of wine  . Drug use: No  . Sexual activity: Yes    Partners: Male    Birth control/protection: Other-see comments    Comment: BSO  Other Topics Concern  . None  Social History Narrative  . None    Vitals:   10/23/17 1102  BP: 110/80  Pulse: 77  Resp: 16  Temp: 99.3 F (37.4 C)  SpO2: 97%   Body mass index is 27.21 kg/m.   Physical Exam  Nursing note and vitals reviewed. Constitutional: She is oriented to person, place, and time. She appears well-developed. She does not appear ill. She appears distressed (mild).  HENT:  Head: Normocephalic and atraumatic.  Right Ear: External ear and ear canal normal. A middle ear effusion is present.  Left Ear: Tympanic membrane, external ear and ear canal normal.  Nose: Rhinorrhea  present. Right sinus exhibits no maxillary sinus tenderness and no frontal sinus tenderness. Left sinus exhibits no maxillary sinus tenderness and no frontal sinus tenderness.  Mouth/Throat: Uvula is midline, oropharynx is clear and moist and mucous membranes are normal.  Nasal voice. Postnasal drainage.  Eyes: Conjunctivae are normal. Pupils are equal, round, and reactive to light.  Neck: Normal range of motion. No muscular tenderness present. No edema and no erythema present.  Cardiovascular: Normal rate and regular rhythm.  No murmur heard. Respiratory: Effort normal and breath sounds normal. No stridor. No respiratory distress.  Musculoskeletal: She exhibits no edema.       Cervical back: She exhibits normal range of motion, no tenderness and no bony tenderness.  Lymphadenopathy:       Head (right side): No submandibular adenopathy present.       Head (left side): No submandibular adenopathy present.  She has cervical adenopathy.       Right cervical: Posterior cervical adenopathy present.       Left cervical: Posterior cervical adenopathy present.  Neurological: She is alert and oriented to person, place, and time. She has normal strength. No cranial nerve deficit.  Skin: Skin is warm. No rash noted. No erythema.  Psychiatric: She has a normal mood and affect. Her speech is normal.  Well groomed, good eye contact.    ASSESSMENT AND PLAN:   Bridget Flynn was seen today for cough, generalized body aches, fever and nasal congestion.  Diagnoses and all orders for this visit:  Fever, unspecified fever cause -     POCT Influenza A/B  Generalized body aches -     POCT Influenza A/B  Influenza A -     oseltamivir (TAMIFLU) 75 MG capsule; Take 1 capsule (75 mg total) by mouth 2 (two) times daily for 5 days.   Rapid flu test positive for influenza A. Educated about Dx and treatment options. Symptomatic treatment recommended + Tamiflu. Nasal saline irrigations and steam inhalations may  help. OTC Tylenol 500 mg 4 times per day, plenty of fluids, and rest. Instructed to monitor for signs of complications, clearly instructed about warning signs. I also explained that cough and nasal congestion can last a few days and sometimes weeks.    -Ms. Elzie Rings advised to seek attention immediately if symptoms worsen or to follow if they persist or new concerns arise.       Racquelle Hyser G. [place], MD  [facility]. [place] office.

## 2017-10-24 ENCOUNTER — Other Ambulatory Visit: Payer: Self-pay | Admitting: *Deleted

## 2017-10-24 MED ORDER — NAPROXEN 500 MG PO TABS: 500.0000 mg | ORAL_TABLET | Freq: Two times a day (BID) | ORAL | 0 refills | Status: DC | PRN

## 2017-10-29 ENCOUNTER — Encounter: Payer: Self-pay | Admitting: Gastroenterology

## 2017-12-04 ENCOUNTER — Other Ambulatory Visit: Payer: Self-pay | Admitting: Family Medicine

## 2018-01-07 ENCOUNTER — Other Ambulatory Visit: Payer: Self-pay

## 2018-01-07 ENCOUNTER — Ambulatory Visit (AMBULATORY_SURGERY_CENTER): Payer: Self-pay | Admitting: *Deleted

## 2018-01-07 ENCOUNTER — Encounter: Payer: Self-pay | Admitting: Gastroenterology

## 2018-01-07 VITALS — Ht 63.5 in | Wt 152.4 lb

## 2018-01-07 DIAGNOSIS — Z1211 Encounter for screening for malignant neoplasm of colon: Secondary | ICD-10-CM

## 2018-01-07 MED ORDER — NA SULFATE-K SULFATE-MG SULF 17.5-3.13-1.6 GM/177ML PO SOLN: 1.0000 [IU] | Freq: Once | ORAL | 0 refills | Status: AC

## 2018-01-07 NOTE — Progress Notes (Signed)
No egg or soy allergy known to patient  No issues with past sedation with any surgeries  or procedures, no intubation problems  No diet pills per patient No home 02 use per patient  No blood thinners per patient  Pt denies issues with constipation yes takes metamucil  No A fib or A flutter  EMMI video sent to pt's e mail

## 2018-01-21 ENCOUNTER — Ambulatory Visit (AMBULATORY_SURGERY_CENTER): Payer: BLUE CROSS/BLUE SHIELD | Admitting: Gastroenterology

## 2018-01-21 ENCOUNTER — Encounter: Payer: Self-pay | Admitting: Gastroenterology

## 2018-01-21 ENCOUNTER — Other Ambulatory Visit: Payer: Self-pay

## 2018-01-21 VITALS — BP 118/72 | HR 54 | Temp 98.2°F | Resp 12 | Ht 63.5 in | Wt 152.0 lb

## 2018-01-21 DIAGNOSIS — Z1212 Encounter for screening for malignant neoplasm of rectum: Secondary | ICD-10-CM

## 2018-01-21 DIAGNOSIS — Z1211 Encounter for screening for malignant neoplasm of colon: Secondary | ICD-10-CM

## 2018-01-21 DIAGNOSIS — Z86010 Personal history of colonic polyps: Secondary | ICD-10-CM | POA: Diagnosis not present

## 2018-01-21 MED ORDER — SODIUM CHLORIDE 0.9 % IV SOLN: 500.0000 mL | Freq: Once | INTRAVENOUS | Status: DC

## 2018-01-21 NOTE — Progress Notes (Signed)
Pt's states no medical or surgical changes since previsit or office visit. 

## 2018-01-21 NOTE — Patient Instructions (Signed)
Impression/Recommendations:  Hemorrhoid handout given to patient.  Resume previous diet. Continue present medications.  Repeat colonoscopy in 10 years for screening purposes.  YOU HAD AN ENDOSCOPIC PROCEDURE TODAY AT THE [facility]:   Refer to the procedure report that was given to you for any specific questions about what was found during the examination.  If the procedure report does not answer your questions, please call your gastroenterologist to clarify.  If you requested that your care partner not be given the details of your procedure findings, then the procedure report has been included in a sealed envelope for you to review at your convenience later.  YOU SHOULD EXPECT: Some feelings of bloating in the abdomen. Passage of more gas than usual.  Walking can help get rid of the air that was put into your GI tract during the procedure and reduce the bloating. If you had a lower endoscopy (such as a colonoscopy or flexible sigmoidoscopy) you may notice spotting of blood in your stool or on the toilet paper. If you underwent a bowel prep for your procedure, you may not have a normal bowel movement for a few days.  Please Note:  You might notice some irritation and congestion in your nose or some drainage.  This is from the oxygen used during your procedure.  There is no need for concern and it should clear up in a day or so.  SYMPTOMS TO REPORT IMMEDIATELY:   Following lower endoscopy (colonoscopy or flexible sigmoidoscopy):  Excessive amounts of blood in the stool  Significant tenderness or worsening of abdominal pains  Swelling of the abdomen that is new, acute  Fever of 100F or higher For urgent or emergent issues, a gastroenterologist can be reached at any hour by calling [phone].   DIET:  We do recommend a small meal at first, but then you may proceed to your regular diet.  Drink plenty of fluids but you should avoid alcoholic beverages for 24  hours.  ACTIVITY:  You should plan to take it easy for the rest of today and you should NOT DRIVE or use heavy machinery until tomorrow (because of the sedation medicines used during the test).    FOLLOW UP: Our staff will call the number listed on your records the next business day following your procedure to check on you and address any questions or concerns that you may have regarding the information given to you following your procedure. If we do not reach you, we will leave a message.  However, if you are feeling well and you are not experiencing any problems, there is no need to return our call.  We will assume that you have returned to your regular daily activities without incident.  If any biopsies were taken you will be contacted by phone or by letter within the next 1-3 weeks.  Please call us at [phone] if you have not heard about the biopsies in 3 weeks.    SIGNATURES/CONFIDENTIALITY: You and/or your care partner have signed paperwork which will be entered into your electronic medical record.  These signatures attest to the fact that that the information above on your After Visit Summary has been reviewed and is understood.  Full responsibility of the confidentiality of this discharge information lies with you and/or your care-partner. 

## 2018-01-21 NOTE — Progress Notes (Signed)
Report given to PACU, vss 

## 2018-01-21 NOTE — Op Note (Signed)
[facility] Patient Name: Bridget Flynn Procedure Date: 01/21/2018 9:09 AM MRN: [id number] Endoscopist: Napoleon Form , MD Age: 62 Referring MD:  Date of Birth: [birth year] Gender: Female Account #: [id number] Procedure:                Colonoscopy Indications:              Screening for colorectal malignant neoplasm, Last                            colonoscopy: 2007 Medicines:                Monitored Anesthesia Care Procedure:                Pre-Anesthesia Assessment:                           - Prior to the procedure, a History and Physical                            was performed, and patient medications and                            allergies were reviewed. The patient's tolerance of                            previous anesthesia was also reviewed. The risks                            and benefits of the procedure and the sedation                            options and risks were discussed with the patient.                            All questions were answered, and informed consent                            was obtained. Prior Anticoagulants: The patient has                            taken no previous anticoagulant or antiplatelet                            agents. ASA Grade Assessment: II - A patient with                            mild systemic disease. After reviewing the risks                            and benefits, the patient was deemed in                            satisfactory condition to undergo the procedure.  After obtaining informed consent, the colonoscope                            was passed under direct vision. Throughout the                            procedure, the patient's blood pressure, pulse, and                            oxygen saturations were monitored continuously. The                            Model PCF-H190DL [id number]) scope was introduced                            through the anus and advanced to the  the cecum,                            identified by appendiceal orifice and ileocecal                            valve. The colonoscopy was performed without                            difficulty. The patient tolerated the procedure                            well. The quality of the bowel preparation was                            excellent. The ileocecal valve, appendiceal                            orifice, and rectum were photographed. Scope In: 9:19:34 AM Scope Out: 9:35:31 AM Scope Withdrawal Time: 0 hours 8 minutes 44 seconds  Total Procedure Duration: 0 hours 15 minutes 57 seconds  Findings:                 The perianal and digital rectal examinations were                            normal.                           Non-bleeding internal hemorrhoids were found during                            retroflexion. The hemorrhoids were medium-sized.                           The exam was otherwise without abnormality. Complications:            No immediate complications. Estimated Blood Loss:     Estimated blood loss: none. Impression:               - Non-bleeding internal hemorrhoids.                           -  The examination was otherwise normal.                           - No specimens collected. Recommendation:           - Patient has a contact number available for                            emergencies. The signs and symptoms of potential                            delayed complications were discussed with the                            patient. Return to normal activities tomorrow.                            Written discharge instructions were provided to the                            patient.                           - Resume previous diet.                           - Continue present medications.                           - Repeat colonoscopy in 10 years for screening                            purposes. Napoleon Form, MD 01/21/2018 9:46:01 AM This report has been  signed electronically.

## 2018-01-22 ENCOUNTER — Telehealth: Payer: Self-pay | Admitting: *Deleted

## 2018-01-22 NOTE — Telephone Encounter (Signed)
  Follow up Call-  Call back number 01/21/2018  Post procedure Call Back phone  # [phone]  Permission to leave phone message Yes  Some recent data might be hidden     Patient questions:  Do you have a fever, pain , or abdominal swelling? No. Pain Score  0 *  Have you tolerated food without any problems? No.  Have you been able to return to your normal activities? No.  Do you have any questions about your discharge instructions: Diet   Yes.   Medications  Yes.   Follow up visit  Yes.    Do you have questions or concerns about your Care? No.  Actions: * If pain score is 4 or above: No action needed, pain <4.

## 2018-02-08 ENCOUNTER — Other Ambulatory Visit: Payer: Self-pay | Admitting: Family Medicine

## 2018-03-05 ENCOUNTER — Encounter: Payer: Self-pay | Admitting: Family Medicine

## 2018-03-26 ENCOUNTER — Ambulatory Visit: Payer: BLUE CROSS/BLUE SHIELD | Admitting: Obstetrics & Gynecology

## 2018-03-29 ENCOUNTER — Other Ambulatory Visit: Payer: Self-pay | Admitting: Family Medicine

## 2018-04-02 ENCOUNTER — Other Ambulatory Visit: Payer: Self-pay | Admitting: Family Medicine

## 2018-04-12 ENCOUNTER — Other Ambulatory Visit (HOSPITAL_COMMUNITY)
Admission: RE | Admit: 2018-04-12 | Discharge: 2018-04-12 | Disposition: A | Discharge status: Home / Self Care | Payer: BLUE CROSS/BLUE SHIELD | Source: Ambulatory Visit | Attending: Obstetrics & Gynecology | Admitting: Obstetrics & Gynecology

## 2018-04-12 ENCOUNTER — Ambulatory Visit: Payer: BLUE CROSS/BLUE SHIELD | Admitting: Obstetrics & Gynecology

## 2018-04-12 ENCOUNTER — Encounter: Payer: Self-pay | Admitting: Obstetrics & Gynecology

## 2018-04-12 ENCOUNTER — Encounter

## 2018-04-12 VITALS — BP 128/80 | HR 88 | Resp 16 | Ht 63.0 in | Wt 150.0 lb

## 2018-04-12 DIAGNOSIS — Z01419 Encounter for gynecological examination (general) (routine) without abnormal findings: Secondary | ICD-10-CM | POA: Diagnosis not present

## 2018-04-12 DIAGNOSIS — Z124 Encounter for screening for malignant neoplasm of cervix: Secondary | ICD-10-CM

## 2018-04-12 MED ORDER — HYDROCHLOROTHIAZIDE 25 MG PO TABS: 25.0000 mg | ORAL_TABLET | Freq: Every day | ORAL | 3 refills | Status: DC | PRN

## 2018-04-12 NOTE — Progress Notes (Signed)
62 y.o. [id number] MarriedCaucasianF here for annual exam.  Doing well.  They have a new baby in the family--granddaughter born in March.  Also, they had a wedding this past year.    Denies vaginal bleeding.    Patient's last menstrual period was 10/26/2005.          Sexually active: Yes.    The current method of family planning is post menopausal status.    Exercising: Yes.    walking Smoker:  no  Health Maintenance: Pap:  09/02/15 Neg. HR HPV:neg  History of abnormal Pap:  no MMG:  12/22/16 BIRADS1:Neg  Colonoscopy:  01/21/18 Normal. F/u 10 years  BMD:   11/15/15 Osteopenia  TDaP:  07/2009 Pneumonia vaccine(s):  2013 Shingrix: Zostavax 4/17 Hep C testing: 09/02/15 neg  Screening Labs: PCP   reports that she has never smoked. She has never used smokeless tobacco. She reports that she drank about 0.6 oz of alcohol per week. She reports that she does not use drugs.  Past Medical History:  Diagnosis Date  . ALLERGIC RHINITIS 11/25/2007   Qualifier: Diagnosis of  By: Ronnald Ramp CNA/MA, Janett Billow    . Allergy   . ASTHMA 11/25/2007   Qualifier: Diagnosis of  By: Ronnald Ramp CNA/MA, Janett Billow    . Asthma   . HYPERLIPIDEMIA 07/12/2009   Qualifier: Diagnosis of  By: Valma Cava LPN, Izora Gala    . HYPOGLYCEMIA, HX OF 05/06/2009   Qualifier: Diagnosis of  By: Sherlynn Stalls, CMA, Cindy    . Osteopenia   . SINUSITIS- ACUTE-NOS 01/13/2010   Qualifier: Diagnosis of  By: Leanne Chang MD, Bruce    . VITAMIN D DEFICIENCY 07/12/2009   Qualifier: Diagnosis of  By: Valma Cava LPN, Izora Gala      Past Surgical History:  Procedure Laterality Date  . COLONOSCOPY    . COLONOSCOPY W/ POLYPECTOMY  2004  . DILATION AND CURETTAGE OF UTERUS    . LAPAROSCOPIC BILATERAL SALPINGECTOMY  08/24/08    Current Outpatient Medications  Medication Sig Dispense Refill  . B Complex Vitamins (VITAMIN-B COMPLEX PO) Take by mouth daily. Takes only when fatigued.    . Chlorpheniramine Maleate (CHLOR-TABLETS PO) Take by mouth daily as needed.     .  Cholecalciferol (VITAMIN D3) 1000 UNITS CAPS Take by mouth 3 (three) times a week. Takes once per week.    Marland Kitchen Fexofenadine HCl (MUCINEX ALLERGY PO) Take by mouth daily as needed.     . fluticasone (FLONASE) 50 MCG/ACT nasal spray Place into both nostrils daily.    . hydrochlorothiazide (HYDRODIURIL) 25 MG tablet TAKE 1 TABLET BY MOUTH EVERY DAY AS NEEDED 30 tablet 0  . naproxen (NAPROSYN) 500 MG tablet TAKE 1 TABLET BY MOUTH TWICE A DAY AS NEEDED 60 tablet 0  . rosuvastatin (CRESTOR) 20 MG tablet TAKE 1 TABLET BY MOUTH EVERY DAY 90 tablet 0  . albuterol (PROVENTIL HFA) 108 (90 Base) MCG/ACT inhaler Inhale 2 puffs into the lungs every 6 (six) hours as needed. (Patient not taking: Reported on 01/07/2018) 1 Inhaler 0   Current Facility-Administered Medications  Medication Dose Route Frequency Provider Last Rate Last Dose  . 0.9 %  sodium chloride infusion  500 mL Intravenous Once Nandigam, Venia Minks, MD        Family History  Problem Relation Age of Onset  . Diabetes Mother   . Cancer Mother        ovarian cancer  . Diabetes Father   . Cancer Father        Hodgkins lymphoma  .  Diabetes Other   . Cancer Other        ovarian  . BRCA 1/2 Neg Hx   . Colon cancer Neg Hx   . Colon polyps Neg Hx   . Esophageal cancer Neg Hx   . Rectal cancer Neg Hx   . Stomach cancer Neg Hx     Review of Systems  All other systems reviewed and are negative.   Exam:   BP 128/80 (BP Location: Right Arm, Patient Position: Sitting, Cuff Size: Normal)   Pulse 88   Resp 16   Ht 5' 3"  (1.6 m)   Wt 150 lb (68 kg)   LMP 10/26/2005   BMI 26.57 kg/m   Height:   Height: 5' 3"  (160 cm)  Ht Readings from Last 3 Encounters:  04/12/18 5' 3"  (1.6 m)  01/21/18 5' 3.5" (1.613 m)  01/07/18 5' 3.5" (1.613 m)    General appearance: alert, cooperative and appears stated age Head: Normocephalic, without obvious abnormality, atraumatic Neck: no adenopathy, supple, symmetrical, trachea midline and thyroid normal to  inspection and palpation Lungs: clear to auscultation bilaterally Breasts: normal appearance, no masses or tenderness Heart: regular rate and rhythm Abdomen: soft, non-tender; bowel sounds normal; no masses,  no organomegaly Extremities: extremities normal, atraumatic, no cyanosis or edema Skin: Skin color, texture, turgor normal. No rashes or lesions Lymph nodes: Cervical, supraclavicular, and axillary nodes normal. No abnormal inguinal nodes palpated Neurologic: Grossly normal   Pelvic: External genitalia:  no lesions              Urethra:  normal appearing urethra with no masses, tenderness or lesions              Bartholins and Skenes: normal                 Vagina: normal appearing vagina with normal color and discharge, no lesions              Cervix: no lesions              Pap taken: Yes.   Bimanual Exam:  Uterus:  normal size, contour, position, consistency, mobility, non-tender              Adnexa: normal adnexa and no mass, fullness, tenderness               Rectovaginal: Confirms               Anus:  normal sphincter tone, no lesions  Chaperone was present for exam.  A:  Well Woman with normal exam PMP, no HRT Asthma Hypertension Osteopenia  P:   Mammogram guidelines reviewed.  Aware it is due. pap smear and HR obtained now Blood work will be done with Dr. Elease Hashimoto.  She states she will call for her appt RF for HCTZ 25m daily.  #30/3RF.  She will have Dr. BElease HashimotoRF this when she sees him. D/w pt Shingrix vaccination.   return annually or prn

## 2018-04-15 LAB — CYTOLOGY - PAP
DIAGNOSIS: NEGATIVE
HPV: NOT DETECTED

## 2018-05-22 ENCOUNTER — Encounter: Payer: BLUE CROSS/BLUE SHIELD | Admitting: Family Medicine

## 2018-05-28 ENCOUNTER — Other Ambulatory Visit: Payer: Self-pay | Admitting: Family Medicine

## 2018-05-28 DIAGNOSIS — Z1231 Encounter for screening mammogram for malignant neoplasm of breast: Secondary | ICD-10-CM

## 2018-05-29 ENCOUNTER — Ambulatory Visit (INDEPENDENT_AMBULATORY_CARE_PROVIDER_SITE_OTHER): Payer: BLUE CROSS/BLUE SHIELD | Admitting: Family Medicine

## 2018-05-29 ENCOUNTER — Encounter: Payer: Self-pay | Admitting: Family Medicine

## 2018-05-29 VITALS — BP 120/80 | HR 63 | Temp 98.7°F | Ht 63.0 in | Wt 148.3 lb

## 2018-05-29 DIAGNOSIS — Z0000 Encounter for general adult medical examination without abnormal findings: Secondary | ICD-10-CM

## 2018-05-29 DIAGNOSIS — Z23 Encounter for immunization: Secondary | ICD-10-CM | POA: Diagnosis not present

## 2018-05-29 LAB — CBC WITH DIFFERENTIAL/PLATELET
BASOS ABS: 0 10*3/uL (ref 0.0–0.1)
Basophils Relative: 0.8 % (ref 0.0–3.0)
EOS ABS: 0.3 10*3/uL (ref 0.0–0.7)
Eosinophils Relative: 6 % — ABNORMAL HIGH (ref 0.0–5.0)
HEMATOCRIT: 41 % (ref 36.0–46.0)
Hemoglobin: 14.3 g/dL (ref 12.0–15.0)
LYMPHS ABS: 1.8 10*3/uL (ref 0.7–4.0)
LYMPHS PCT: 37.8 % (ref 12.0–46.0)
MCHC: 34.9 g/dL (ref 30.0–36.0)
MCV: 84 fl (ref 78.0–100.0)
MONO ABS: 0.5 10*3/uL (ref 0.1–1.0)
Monocytes Relative: 10.4 % (ref 3.0–12.0)
NEUTROS PCT: 45 % (ref 43.0–77.0)
Neutro Abs: 2.2 10*3/uL (ref 1.4–7.7)
PLATELETS: 348 10*3/uL (ref 150.0–400.0)
RBC: 4.88 Mil/uL (ref 3.87–5.11)
RDW: 13.3 % (ref 11.5–15.5)
WBC: 4.9 10*3/uL (ref 4.0–10.5)

## 2018-05-29 LAB — BASIC METABOLIC PANEL
BUN: 21 mg/dL (ref 6–23)
CHLORIDE: 96 meq/L (ref 96–112)
CO2: 35 meq/L — AB (ref 19–32)
CREATININE: 0.7 mg/dL (ref 0.40–1.20)
Calcium: 10.5 mg/dL (ref 8.4–10.5)
GFR: 89.62 mL/min (ref >60.00)
Glucose, Bld: 100 mg/dL — ABNORMAL HIGH (ref 70–99)
Potassium: 3.5 mEq/L (ref 3.5–5.1)
Sodium: 139 mEq/L (ref 135–145)

## 2018-05-29 LAB — LIPID PANEL
CHOL/HDL RATIO: 4
Cholesterol: 203 mg/dL — ABNORMAL HIGH (ref 0–200)
HDL: 50.2 mg/dL (ref >39.00)
LDL CALC: 131 mg/dL — AB (ref 0–99)
NonHDL: 152.35
Triglycerides: 107 mg/dL (ref 0.0–149.0)
VLDL: 21.4 mg/dL (ref 0.0–40.0)

## 2018-05-29 LAB — HEPATIC FUNCTION PANEL
ALT: 18 U/L (ref 0–35)
AST: 17 U/L (ref 0–37)
Albumin: 4.8 g/dL (ref 3.5–5.2)
Alkaline Phosphatase: 49 U/L (ref 39–117)
BILIRUBIN TOTAL: 0.6 mg/dL (ref 0.2–1.2)
Bilirubin, Direct: 0.1 mg/dL (ref 0.0–0.3)
TOTAL PROTEIN: 7.6 g/dL (ref 6.0–8.3)

## 2018-05-29 LAB — TSH: TSH: 2.82 u[IU]/mL (ref 0.35–4.50)

## 2018-05-29 NOTE — Patient Instructions (Signed)
Palpitations A palpitation is the feeling that your heartbeat is irregular or is faster than normal. It may feel like your heart is fluttering or skipping a beat. Palpitations are usually not a serious problem. They may be caused by many things, including smoking, caffeine, alcohol, stress, and certain medicines. Although most causes of palpitations are not serious, palpitations can be a sign of a serious medical problem. In some cases, you may need further medical evaluation. Follow these instructions at home: Pay attention to any changes in your symptoms. Take these actions to help with your condition:  Avoid the following: ? Caffeinated coffee, tea, soft drinks, diet pills, and energy drinks. ? Chocolate. ? Alcohol.  Do not use any tobacco products, such as cigarettes, chewing tobacco, and e-cigarettes. If you need help quitting, ask your health care provider.  Try to reduce your stress and anxiety. Things that can help you relax include: ? Yoga. ? Meditation. ? Physical activity, such as swimming, jogging, or walking. ? Biofeedback. This is a method that helps you learn to use your mind to control things in your body, such as your heartbeats.  Get plenty of rest and sleep.  Take over-the-counter and prescription medicines only as told by your health care provider.  Keep all follow-up visits as told by your health care provider. This is important.  Contact a health care provider if:  You continue to have a fast or irregular heartbeat after 24 hours.  Your palpitations occur more often. Get help right away if:  You have chest pain or shortness of breath.  You have a severe headache.  You feel dizzy or you faint. This information is not intended to replace advice given to you by your health care provider. Make sure you discuss any questions you have with your health care provider. Document Released: 09/08/2000 Document Revised: 02/14/2016 Document Reviewed: 05/27/2015 Elsevier  Interactive Patient Education  2018 Elsevier Inc.  

## 2018-05-29 NOTE — Addendum Note (Signed)
Addended by: Kern Reap B on: 05/29/2018 09:28 AM   Modules accepted: Orders

## 2018-05-29 NOTE — Progress Notes (Signed)
Subjective:     Patient ID: Bridget Flynn, female   DOB: 1955-11-22, 62 y.o.   MRN: [id number]  HPI Patient seen for physical exam. Generally very healthy. She does have history of hypertension and hyperlipidemia. She remains on HCTZ and Crestor. Her father had coronary disease in his 69s. Patient is nonsmoker. She exercises regularly. Denies any recent chest pains. She has had some intermittent palpitations. She states she's had a couple episodes at rest for her heart rate goes up as high as 150 and usually last about 5 minutes. No associated dyspnea or chest pain. No dizziness. No syncope. Episodes occur very infrequently usually less than once per month  Needs shingles vaccine and flu vaccine. Will need tetanus booster next year. Colonoscopy up-to-date. She sees GYN for Pap smears and mammograms. She's had previous hepatitis C screening  Past Medical History:  Diagnosis Date  . ALLERGIC RHINITIS 11/25/2007   Qualifier: Diagnosis of  By: Yetta Barre CNA/MA, Shanda Bumps    . Allergy   . ASTHMA 11/25/2007   Qualifier: Diagnosis of  By: Yetta Barre CNA/MA, Shanda Bumps    . Asthma   . HYPERLIPIDEMIA 07/12/2009   Qualifier: Diagnosis of  By: Gabriel Rung LPN, Harriett Sine    . HYPOGLYCEMIA, HX OF 05/06/2009   Qualifier: Diagnosis of  By: Linna Darner, CMA, Cindy    . Osteopenia   . SINUSITIS- ACUTE-NOS 01/13/2010   Qualifier: Diagnosis of  By: Cato Mulligan MD, Bruce    . VITAMIN D DEFICIENCY 07/12/2009   Qualifier: Diagnosis of  By: Gabriel Rung LPN, Harriett Sine     Past Surgical History:  Procedure Laterality Date  . COLONOSCOPY    . COLONOSCOPY W/ POLYPECTOMY  2004  . DILATION AND CURETTAGE OF UTERUS    . LAPAROSCOPIC BILATERAL SALPINGECTOMY  08/24/08    reports that she has never smoked. She has never used smokeless tobacco. She reports that she drank about 1.0 standard drinks of alcohol per week. She reports that she does not use drugs. family history includes Cancer in her father, mother, and other; Diabetes in her father, mother, and  other. Allergies  Allergen Reactions  . Allegra [Fexofenadine Hcl]     Depression, cryingspells  . Biaxin [Clarithromycin]     REACTION: diarrhea, vomiting  . Erythromycin     REACTION: GI upset  . Lipitor [Atorvastatin] Other (See Comments)    arthralgias  . Sulfa Antibiotics      Review of Systems  Constitutional: Negative for activity change, appetite change, fatigue, fever and unexpected weight change.  HENT: Negative for ear pain, hearing loss, sore throat and trouble swallowing.   Eyes: Negative for visual disturbance.  Respiratory: Negative for cough and shortness of breath.   Cardiovascular: Positive for palpitations. Negative for chest pain and leg swelling.  Gastrointestinal: Negative for abdominal pain, blood in stool, constipation and diarrhea.  Endocrine: Negative for polydipsia and polyuria.  Genitourinary: Negative for dysuria and hematuria.  Musculoskeletal: Negative for arthralgias, back pain and myalgias.  Skin: Negative for rash.  Neurological: Negative for dizziness, syncope and headaches.  Hematological: Negative for adenopathy.  Psychiatric/Behavioral: Negative for confusion and dysphoric mood.       Objective:   Physical Exam  Constitutional: She is oriented to person, place, and time. She appears well-developed and well-nourished.  HENT:  Head: Normocephalic and atraumatic.  Eyes: Pupils are equal, round, and reactive to light. EOM are normal.  Neck: Normal range of motion. Neck supple. No thyromegaly present.  Cardiovascular: Normal rate, regular rhythm and normal heart sounds.  No murmur heard. Pulmonary/Chest: Breath sounds normal. No respiratory distress. She has no wheezes. She has no rales.  Abdominal: Soft. Bowel sounds are normal. She exhibits no distension and no mass. There is no tenderness. There is no rebound and no guarding.  Genitourinary:  Genitourinary Comments: Per gyn   Musculoskeletal: Normal range of motion. She exhibits no  edema.  Lymphadenopathy:    She has no cervical adenopathy.  Neurological: She is alert and oriented to person, place, and time. She displays normal reflexes. No cranial nerve deficit.  Skin: No rash noted.  Psychiatric: She has a normal mood and affect. Her behavior is normal. Judgment and thought content normal.       Assessment:     Physical exam. Generally healthy 62 year old female. She describes some intermittent palpitations which are very infrequent and very transient.The following issues were discussed    Plan:     -continue to minimize caffeine intake -Shingles and flu vaccines given and she will return in 2 months for repeat shingles vaccine -Obtain screening labs -Consider event monitor if she has recurrent episodes of palpitations  Kristian Covey MD [facility]

## 2018-05-30 ENCOUNTER — Encounter: Payer: Self-pay | Admitting: Family Medicine

## 2018-06-24 ENCOUNTER — Ambulatory Visit
Admission: RE | Admit: 2018-06-24 | Discharge: 2018-06-24 | Disposition: A | Discharge status: Home / Self Care | Payer: BLUE CROSS/BLUE SHIELD | Source: Ambulatory Visit | Attending: Family Medicine | Admitting: Family Medicine

## 2018-06-24 DIAGNOSIS — Z1231 Encounter for screening mammogram for malignant neoplasm of breast: Secondary | ICD-10-CM | POA: Diagnosis not present

## 2018-06-27 ENCOUNTER — Other Ambulatory Visit: Payer: Self-pay | Admitting: Family Medicine

## 2018-07-17 ENCOUNTER — Other Ambulatory Visit: Payer: Self-pay | Admitting: Family Medicine

## 2018-07-17 ENCOUNTER — Other Ambulatory Visit: Payer: Self-pay | Admitting: Obstetrics & Gynecology

## 2018-07-17 NOTE — Telephone Encounter (Signed)
Last OV 05/29/18, No future OV  Last filled by Dr. Jerene Bears on 04/12/18, # 30 with 3 refills  Please advise if okay to fill

## 2018-07-17 NOTE — Telephone Encounter (Signed)
Refill OK for 6 months. 

## 2018-07-17 NOTE — Telephone Encounter (Signed)
Please call her pharmacy and ask they forward this request to Dr. Caryl Never, her PCP.  I refilled it until she was able to see him and there is a note in Epic when she saw him.  Rx declined.  Thanks.

## 2018-07-17 NOTE — Telephone Encounter (Signed)
Pharmacy notified and RX sent to PCP.

## 2018-07-17 NOTE — Telephone Encounter (Signed)
Medication refill request: hydrochlorothiazide 25mg  Last AEX:  04/12/18 Next AEX: 08/05/19 Last MMG (if hormonal medication request): 06/24/18  Bi- rads 1 neg  Refill authorized: #30 with 0 RF

## 2018-07-24 ENCOUNTER — Other Ambulatory Visit: Payer: Self-pay

## 2018-07-24 ENCOUNTER — Ambulatory Visit (INDEPENDENT_AMBULATORY_CARE_PROVIDER_SITE_OTHER): Payer: BLUE CROSS/BLUE SHIELD | Admitting: Family Medicine

## 2018-07-24 ENCOUNTER — Encounter: Payer: Self-pay | Admitting: Family Medicine

## 2018-07-24 VITALS — BP 124/76 | HR 71 | Temp 98.5°F | Ht 63.0 in | Wt 141.2 lb

## 2018-07-24 DIAGNOSIS — J019 Acute sinusitis, unspecified: Secondary | ICD-10-CM | POA: Diagnosis not present

## 2018-07-24 MED ORDER — AMOXICILLIN-POT CLAVULANATE 875-125 MG PO TABS: 1.0000 | ORAL_TABLET | Freq: Two times a day (BID) | ORAL | 0 refills | Status: DC

## 2018-07-24 NOTE — Patient Instructions (Signed)

## 2018-07-24 NOTE — Progress Notes (Signed)
  Subjective:     Patient ID: Bridget Flynn, female   DOB: 06-27-1956, 62 y.o.   MRN: [id number]  HPI Patient seen with onset of upper respiratory symptoms over 2 weeks ago.  She now presents with some persistent greenish nasal discharge, daily headaches and productive cough.  No fever.  Increased malaise.  She had frequent sinusitis in the past.  She has tried multiple things including increase fluids, hot showers, nasal saline irrigation without improvement  Past Medical History:  Diagnosis Date  . ALLERGIC RHINITIS 11/25/2007   Qualifier: Diagnosis of  By: Yetta Barre CNA/MA, Shanda Bumps    . Allergy   . ASTHMA 11/25/2007   Qualifier: Diagnosis of  By: Yetta Barre CNA/MA, Shanda Bumps    . Asthma   . HYPERLIPIDEMIA 07/12/2009   Qualifier: Diagnosis of  By: Gabriel Rung LPN, Harriett Sine    . HYPOGLYCEMIA, HX OF 05/06/2009   Qualifier: Diagnosis of  By: Linna Darner, CMA, Cindy    . Osteopenia   . SINUSITIS- ACUTE-NOS 01/13/2010   Qualifier: Diagnosis of  By: Cato Mulligan MD, Bruce    . VITAMIN D DEFICIENCY 07/12/2009   Qualifier: Diagnosis of  By: Gabriel Rung LPN, Harriett Sine     Past Surgical History:  Procedure Laterality Date  . COLONOSCOPY    . COLONOSCOPY W/ POLYPECTOMY  2004  . DILATION AND CURETTAGE OF UTERUS    . LAPAROSCOPIC BILATERAL SALPINGECTOMY  08/24/08    reports that she has never smoked. She has never used smokeless tobacco. She reports that she drank about 1.0 standard drinks of alcohol per week. She reports that she does not use drugs. family history includes Cancer in her father, mother, and other; Diabetes in her father, mother, and other. Allergies  Allergen Reactions  . Allegra [Fexofenadine Hcl]     Depression, cryingspells  . Biaxin [Clarithromycin]     REACTION: diarrhea, vomiting  . Erythromycin     REACTION: GI upset  . Lipitor [Atorvastatin] Other (See Comments)    arthralgias  . Sulfa Antibiotics      Review of Systems  Constitutional: Negative for chills and fever.  HENT: Positive for  congestion, sinus pressure and sinus pain.        Objective:   Physical Exam  Constitutional: She appears well-developed and well-nourished.  HENT:  Right Ear: External ear normal.  Left Ear: External ear normal.  Mouth/Throat: Oropharynx is clear and moist.  Neck: Neck supple.  Cardiovascular: Normal rate and regular rhythm.  Pulmonary/Chest: Effort normal and breath sounds normal.  Lymphadenopathy:    She has no cervical adenopathy.       Assessment:     Probable acute sinusitis    Plan:     -Augmentin 875 mg twice daily for 10 days -Touch base if not improving in 1 to 2 weeks  Kristian Covey MD [facility]

## 2018-08-06 ENCOUNTER — Other Ambulatory Visit: Payer: Self-pay

## 2018-08-06 ENCOUNTER — Encounter: Payer: Self-pay | Admitting: Family Medicine

## 2018-08-06 MED ORDER — AMOXICILLIN-POT CLAVULANATE 875-125 MG PO TABS: 1.0000 | ORAL_TABLET | Freq: Two times a day (BID) | ORAL | 0 refills | Status: DC

## 2018-08-06 MED ORDER — ALBUTEROL SULFATE HFA 108 (90 BASE) MCG/ACT IN AERS: 2.0000 | INHALATION_SPRAY | RESPIRATORY_TRACT | 0 refills | Status: DC | PRN

## 2018-08-20 ENCOUNTER — Ambulatory Visit: Payer: BLUE CROSS/BLUE SHIELD | Admitting: Family Medicine

## 2018-12-26 ENCOUNTER — Other Ambulatory Visit: Payer: Self-pay | Admitting: Family Medicine

## 2019-03-10 ENCOUNTER — Other Ambulatory Visit: Payer: Self-pay | Admitting: Family Medicine

## 2019-05-12 ENCOUNTER — Encounter: Payer: Self-pay | Admitting: Family Medicine

## 2019-06-04 ENCOUNTER — Encounter: Payer: Self-pay | Admitting: Family Medicine

## 2019-06-04 ENCOUNTER — Other Ambulatory Visit: Payer: Self-pay

## 2019-06-04 ENCOUNTER — Ambulatory Visit (INDEPENDENT_AMBULATORY_CARE_PROVIDER_SITE_OTHER): Payer: BLUE CROSS/BLUE SHIELD | Admitting: Family Medicine

## 2019-06-04 VITALS — BP 120/80 | HR 70 | Temp 98.4°F | Ht 63.0 in | Wt 149.2 lb

## 2019-06-04 DIAGNOSIS — Z0000 Encounter for general adult medical examination without abnormal findings: Secondary | ICD-10-CM

## 2019-06-04 DIAGNOSIS — Z23 Encounter for immunization: Secondary | ICD-10-CM

## 2019-06-04 MED ORDER — HYDROCHLOROTHIAZIDE 25 MG PO TABS: 25.0000 mg | ORAL_TABLET | Freq: Every day | ORAL | 1 refills | Status: DC | PRN

## 2019-06-04 NOTE — Progress Notes (Signed)
Subjective:     Patient ID: Bridget Flynn, female   DOB: 1955/10/07, 63 y.o.   MRN: [id number]  HPI Patient is seen for physical exam.  She sees gynecologist yearly for her mammograms and has gotten Pap smears through them as well.  She has history of hypertension and hyperlipidemia.  She is currently taking Crestor about every other day.  She does feel like she has had some myalgias when taking Crestor daily.  She also has history of osteopenia and past history of asthma which is mild and intermittent  Health maintenance reviewed  -Previous hepatitis C screen negative -Previous HIV screen negative -Next colonoscopy due 2029 -She plans to get mammogram this fall -Tetanus is due now as well as flu vaccine -She has had previous Zostavax  Past Medical History:  Diagnosis Date  . ALLERGIC RHINITIS 11/25/2007   Qualifier: Diagnosis of  By: Ronnald Ramp CNA/MA, Janett Billow    . Allergy   . ASTHMA 11/25/2007   Qualifier: Diagnosis of  By: Ronnald Ramp CNA/MA, Janett Billow    . Asthma   . HYPERLIPIDEMIA 07/12/2009   Qualifier: Diagnosis of  By: Valma Cava LPN, Izora Gala    . HYPOGLYCEMIA, HX OF 05/06/2009   Qualifier: Diagnosis of  By: Sherlynn Stalls, CMA, Cindy    . Osteopenia   . SINUSITIS- ACUTE-NOS 01/13/2010   Qualifier: Diagnosis of  By: Leanne Chang MD, Lenette Rau    . VITAMIN D DEFICIENCY 07/12/2009   Qualifier: Diagnosis of  By: Valma Cava LPN, Izora Gala     Past Surgical History:  Procedure Laterality Date  . COLONOSCOPY    . COLONOSCOPY W/ POLYPECTOMY  2004  . DILATION AND CURETTAGE OF UTERUS    . LAPAROSCOPIC BILATERAL SALPINGECTOMY  08/24/08    reports that she has never smoked. She has never used smokeless tobacco. She reports previous alcohol use of about 1.0 standard drinks of alcohol per week. She reports that she does not use drugs. family history includes Cancer in her father, mother, and another family member; Diabetes in her father, mother, and another family member. Allergies  Allergen Reactions  . Allegra [Fexofenadine  Hcl]     Depression, cryingspells  . Biaxin [Clarithromycin]     REACTION: diarrhea, vomiting  . Erythromycin     REACTION: GI upset  . Lipitor [Atorvastatin] Other (See Comments)    arthralgias  . Sulfa Antibiotics      Review of Systems  Constitutional: Negative for activity change, appetite change, fatigue, fever and unexpected weight change.  HENT: Negative for ear pain, hearing loss, sore throat and trouble swallowing.   Eyes: Negative for visual disturbance.  Respiratory: Negative for cough and shortness of breath.   Cardiovascular: Negative for chest pain and palpitations.  Gastrointestinal: Negative for abdominal pain, blood in stool, constipation and diarrhea.  Endocrine: Negative for polydipsia and polyuria.  Genitourinary: Negative for dysuria and hematuria.  Musculoskeletal: Negative for arthralgias, back pain and myalgias.  Skin: Negative for rash.  Neurological: Negative for dizziness, syncope and headaches.  Hematological: Negative for adenopathy.  Psychiatric/Behavioral: Negative for confusion and dysphoric mood.       Objective:   Physical Exam Constitutional:      Appearance: Normal appearance.  HENT:     Right Ear: Tympanic membrane normal.     Left Ear: Tympanic membrane normal.  Neck:     Musculoskeletal: Neck supple.  Cardiovascular:     Rate and Rhythm: Normal rate and regular rhythm.     Heart sounds: No gallop.   Pulmonary:  Effort: Pulmonary effort is normal.     Breath sounds: Normal breath sounds.  Abdominal:     Palpations: Abdomen is soft.     Tenderness: There is no abdominal tenderness.  Musculoskeletal:     Right lower leg: No edema.     Left lower leg: No edema.  Lymphadenopathy:     Cervical: No cervical adenopathy.  Neurological:     Mental Status: She is alert.        Assessment:     Physical exam.  We discussed the following health maintenance issues    Plan:     -Flu vaccine given -Tdap given -She has had  previous shingles vaccine -She will continue with regular GYN follow-up -Obtain follow-up lab work -Continue regular weightbearing exercise as well as adequate dietary calcium and vitamin D  Kristian CoveyBruce W Neyra Pettie MD  [facility]

## 2019-06-05 ENCOUNTER — Encounter: Payer: Self-pay | Admitting: Family Medicine

## 2019-06-05 LAB — BASIC METABOLIC PANEL
BUN: 18 mg/dL (ref 6–23)
CO2: 37 mEq/L — ABNORMAL HIGH (ref 19–32)
Calcium: 9.9 mg/dL (ref 8.4–10.5)
Chloride: 94 mEq/L — ABNORMAL LOW (ref 96–112)
Creatinine, Ser: 0.71 mg/dL (ref 0.40–1.20)
GFR: 82.68 mL/min (ref >60.00)
Glucose, Bld: 91 mg/dL (ref 70–99)
Potassium: 3.6 mEq/L (ref 3.5–5.1)
Sodium: 138 mEq/L (ref 135–145)

## 2019-06-05 LAB — TSH: TSH: 2.97 u[IU]/mL (ref 0.35–4.50)

## 2019-06-05 LAB — CBC WITH DIFFERENTIAL/PLATELET
Basophils Absolute: 0.1 10*3/uL (ref 0.0–0.1)
Basophils Relative: 1.2 % (ref 0.0–3.0)
Eosinophils Absolute: 0.5 10*3/uL (ref 0.0–0.7)
Eosinophils Relative: 7 % — ABNORMAL HIGH (ref 0.0–5.0)
HCT: 40 % (ref 36.0–46.0)
Hemoglobin: 13.7 g/dL (ref 12.0–15.0)
Lymphocytes Relative: 37.1 % (ref 12.0–46.0)
Lymphs Abs: 2.7 10*3/uL (ref 0.7–4.0)
MCHC: 34.2 g/dL (ref 30.0–36.0)
MCV: 85.5 fl (ref 78.0–100.0)
Monocytes Absolute: 0.6 10*3/uL (ref 0.1–1.0)
Monocytes Relative: 8.9 % (ref 3.0–12.0)
Neutro Abs: 3.3 10*3/uL (ref 1.4–7.7)
Neutrophils Relative %: 45.8 % (ref 43.0–77.0)
Platelets: 315 10*3/uL (ref 150.0–400.0)
RBC: 4.68 Mil/uL (ref 3.87–5.11)
RDW: 13 % (ref 11.5–15.5)
WBC: 7.2 10*3/uL (ref 4.0–10.5)

## 2019-06-05 LAB — LIPID PANEL
Cholesterol: 141 mg/dL (ref 0–200)
HDL: 46.8 mg/dL (ref >39.00)
LDL Cholesterol: 82 mg/dL (ref 0–99)
NonHDL: 93.87
Total CHOL/HDL Ratio: 3
Triglycerides: 58 mg/dL (ref 0.0–149.0)
VLDL: 11.6 mg/dL (ref 0.0–40.0)

## 2019-06-05 LAB — HEPATIC FUNCTION PANEL
ALT: 21 U/L (ref 0–35)
AST: 22 U/L (ref 0–37)
Albumin: 4.6 g/dL (ref 3.5–5.2)
Alkaline Phosphatase: 45 U/L (ref 39–117)
Bilirubin, Direct: 0.1 mg/dL (ref 0.0–0.3)
Total Bilirubin: 0.4 mg/dL (ref 0.2–1.2)
Total Protein: 7.3 g/dL (ref 6.0–8.3)

## 2019-06-08 ENCOUNTER — Other Ambulatory Visit: Payer: Self-pay | Admitting: Family Medicine

## 2019-06-16 ENCOUNTER — Other Ambulatory Visit: Payer: Self-pay | Admitting: *Deleted

## 2019-06-16 ENCOUNTER — Other Ambulatory Visit: Payer: Self-pay | Admitting: Obstetrics & Gynecology

## 2019-06-16 ENCOUNTER — Other Ambulatory Visit: Payer: Self-pay | Admitting: Family Medicine

## 2019-06-16 DIAGNOSIS — Z1231 Encounter for screening mammogram for malignant neoplasm of breast: Secondary | ICD-10-CM

## 2019-07-31 ENCOUNTER — Ambulatory Visit
Admission: RE | Admit: 2019-07-31 | Discharge: 2019-07-31 | Disposition: A | Discharge status: Home / Self Care | Payer: BC Managed Care – PPO | Source: Ambulatory Visit | Attending: Obstetrics & Gynecology | Admitting: Obstetrics & Gynecology

## 2019-07-31 ENCOUNTER — Other Ambulatory Visit: Payer: Self-pay

## 2019-07-31 DIAGNOSIS — Z1231 Encounter for screening mammogram for malignant neoplasm of breast: Secondary | ICD-10-CM

## 2019-08-05 ENCOUNTER — Encounter: Payer: Self-pay | Admitting: Obstetrics & Gynecology

## 2019-08-05 ENCOUNTER — Ambulatory Visit: Payer: BC Managed Care – PPO | Admitting: Obstetrics & Gynecology

## 2019-08-05 ENCOUNTER — Other Ambulatory Visit: Payer: Self-pay

## 2019-08-05 ENCOUNTER — Ambulatory Visit (INDEPENDENT_AMBULATORY_CARE_PROVIDER_SITE_OTHER): Payer: BC Managed Care – PPO | Admitting: Obstetrics & Gynecology

## 2019-08-05 VITALS — BP 120/72 | HR 72 | Temp 97.4°F | Resp 12 | Ht 63.0 in | Wt 152.4 lb

## 2019-08-05 DIAGNOSIS — Z01419 Encounter for gynecological examination (general) (routine) without abnormal findings: Secondary | ICD-10-CM

## 2019-08-05 NOTE — Progress Notes (Signed)
62 y.o. G61P2012 Married White or Caucasian female here for annual exam.  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  She has an 29 month old.    Denies vaginal bleeding.     Had a severe case of constipation about four months ago.  Strained a lot and felt like "things shifted".  Isn't having any pain.   Patient's last menstrual period was 10/26/2005.          Sexually active: Yes.    The current method of family planning is post menopausal status.    Exercising: Yes.    walking Smoker:  no  Health Maintenance: Pap:  04/12/18 Neg:Neg HR HPV History of abnormal Pap:  no MMG:  07/31/19 BIRADS 1 negative/density b Colonoscopy:  01/21/18 Normal. F/u 10 years  BMD:   11/15/15 Osteopenia  TDaP:  06/04/19 Pneumonia vaccine(s):  2013 Shingrix:  Zostavax 2017 Hep C testing: 09/02/15 Neg Screening Labs: 05/2019 with Dr. Elease Hashimoto   reports that she has never smoked. She has never used smokeless tobacco. She reports previous alcohol use of about 1.0 standard drinks of alcohol per week. She reports that she does not use drugs.  Past Medical History:  Diagnosis Date  . ALLERGIC RHINITIS 11/25/2007   Qualifier: Diagnosis of  By: Ronnald Ramp CNA/MA, Janett Billow    . Allergy   . ASTHMA 11/25/2007   Qualifier: Diagnosis of  By: Ronnald Ramp CNA/MA, Janett Billow    . Asthma   . HYPERLIPIDEMIA 07/12/2009   Qualifier: Diagnosis of  By: Valma Cava LPN, Izora Gala    . HYPOGLYCEMIA, HX OF 05/06/2009   Qualifier: Diagnosis of  By: Sherlynn Stalls, CMA, Cindy    . Osteopenia   . SINUSITIS- ACUTE-NOS 01/13/2010   Qualifier: Diagnosis of  By: Leanne Chang MD, Bruce    . VITAMIN D DEFICIENCY 07/12/2009   Qualifier: Diagnosis of  By: Valma Cava LPN, Izora Gala      Past Surgical History:  Procedure Laterality Date  . COLONOSCOPY    . COLONOSCOPY W/ POLYPECTOMY  2004  . DILATION AND CURETTAGE OF UTERUS    . LAPAROSCOPIC BILATERAL SALPINGECTOMY  08/24/08    Current Outpatient Medications  Medication Sig Dispense Refill  . albuterol  (PROVENTIL HFA) 108 (90 Base) MCG/ACT inhaler Inhale 2 puffs into the lungs every 4 (four) hours as needed. For wheezing 1 Inhaler 0  . B Complex Vitamins (VITAMIN-B COMPLEX PO) Take by mouth daily. Takes only when fatigued.    . Chlorpheniramine Maleate (CHLOR-TABLETS PO) Take by mouth daily as needed.     . Cholecalciferol (VITAMIN D3) 1000 UNITS CAPS Take by mouth 3 (three) times a week. Takes once per week.    Marland Kitchen Fexofenadine HCl (MUCINEX ALLERGY PO) Take by mouth daily as needed.     . fluticasone (FLONASE) 50 MCG/ACT nasal spray Place into both nostrils daily.    . hydrochlorothiazide (HYDRODIURIL) 25 MG tablet Take 1 tablet (25 mg total) by mouth daily as needed. 90 tablet 1  . rosuvastatin (CRESTOR) 20 MG tablet TAKE 1 TABLET BY MOUTH EVERY DAY 90 tablet 3   Current Facility-Administered Medications  Medication Dose Route Frequency Provider Last Rate Last Dose  . 0.9 %  sodium chloride infusion  500 mL Intravenous Once Nandigam, Venia Minks, MD        Family History  Problem Relation Age of Onset  . Diabetes Mother   . Cancer Mother        ovarian cancer  . Diabetes Father   . Cancer  Father        Hodgkins lymphoma  . Diabetes Other   . Cancer Other        ovarian  . BRCA 1/2 Neg Hx   . Colon cancer Neg Hx   . Colon polyps Neg Hx   . Esophageal cancer Neg Hx   . Rectal cancer Neg Hx   . Stomach cancer Neg Hx   . Breast cancer Neg Hx     Review of Systems  All other systems reviewed and are negative.   Exam:   BP 120/72 (BP Location: Left Arm, Patient Position: Sitting, Cuff Size: Normal)   Pulse 72   Temp (!) 97.4 F (36.3 C) (Temporal)   Resp 12   Ht 5' 3"  (1.6 m)   Wt 152 lb 6.4 oz (69.1 kg)   LMP 10/26/2005   BMI 27.00 kg/m   Height:   Height: 5' 3"  (160 cm)  Ht Readings from Last 3 Encounters:  08/05/19 5' 3"  (1.6 m)  06/04/19 5' 3"  (1.6 m)  07/24/18 5' 3"  (1.6 m)    General appearance: alert, cooperative and appears stated age Head: Normocephalic,  without obvious abnormality, atraumatic Neck: no adenopathy, supple, symmetrical, trachea midline and thyroid normal to inspection and palpation Lungs: clear to auscultation bilaterally Breasts: normal appearance, no masses or tenderness Heart: regular rate and rhythm Abdomen: soft, non-tender; bowel sounds normal; no masses,  no organomegaly Extremities: extremities normal, atraumatic, no cyanosis or edema Skin: Skin color, texture, turgor normal. No rashes or lesions Lymph nodes: Cervical, supraclavicular, and axillary nodes normal. No abnormal inguinal nodes palpated Neurologic: Grossly normal   Pelvic: External genitalia:  no lesions              Urethra:  normal appearing urethra with no masses, tenderness or lesions              Bartholins and Skenes: normal                 Vagina: normal appearing vagina with normal color and discharge, no lesions              Cervix: no lesions              Pap taken: No. Bimanual Exam:  Uterus:  normal size, contour, position, consistency, mobility, non-tender              Adnexa: normal adnexa and no mass, fullness, tenderness               Rectovaginal: Confirms               Anus:  normal sphincter tone, no lesions  Chaperone was present for exam.  A:  Well Woman with normal exam PMP, no HRT Asthma Hypertension Osteopenia  P:   Mammogram guidelines reviewed.  She just had this done. pap smear with neg HR HPV 2019.  Not indicated today. Blood work done in September Colonoscopy is UTD Return annually or prn

## 2019-09-12 ENCOUNTER — Encounter: Payer: Self-pay | Admitting: Family Medicine

## 2019-09-12 ENCOUNTER — Other Ambulatory Visit: Payer: Self-pay | Admitting: Family Medicine

## 2019-09-12 NOTE — Telephone Encounter (Signed)
Medication Refill - Medication: rosuvastatin (CRESTOR) 20 MG tablet, hydrochlorothiazide (HYDRODIURIL) 25 MG tablet    Has the patient contacted their pharmacy? Yes.   Pt is completely out of both medications. Please advise.  (Agent: If no, request that the patient contact the pharmacy for the refill.) (Agent: If yes, when and what did the pharmacy advise?)  Preferred Pharmacy (with phone number or street name):  [facility] [address], [place]  [place] [address]  Phone: [phone] Fax: [phone]  Not a 24 hour pharmacy; exact hours not known.     Agent: Please be advised that RX refills may take up to 3 business days. We ask that you follow-up with your pharmacy.

## 2019-10-15 ENCOUNTER — Ambulatory Visit: Payer: Medicare Other | Attending: Internal Medicine

## 2019-10-15 DIAGNOSIS — Z23 Encounter for immunization: Secondary | ICD-10-CM

## 2019-10-15 NOTE — Progress Notes (Signed)
   Covid-19 Vaccination Clinic  Name:  Itha Kroeker    MRN: [id number] DOB: November 07, 1955  10/15/2019  Ms. Heady was observed post Covid-19 immunization for 15 minutes without incidence. She was provided with Vaccine Information Sheet and instruction to access the V-Safe system.   Ms. Ketchum was instructed to call 911 with any severe reactions post vaccine: Marland Kitchen Difficulty breathing  . Swelling of your face and throat  . A fast heartbeat  . A bad rash all over your body  . Dizziness and weakness    Immunizations Administered    Name Date Dose VIS Date Route   Pfizer COVID-19 Vaccine 10/15/2019  8:47 AM 0.3 mL 09/05/2019 Intramuscular   Manufacturer: [employer], [employer]   Lot: [id number]   NDC: [address]-2

## 2019-11-05 ENCOUNTER — Ambulatory Visit: Payer: Medicare Other | Attending: Internal Medicine

## 2019-11-05 DIAGNOSIS — Z23 Encounter for immunization: Secondary | ICD-10-CM | POA: Insufficient documentation

## 2019-11-05 NOTE — Progress Notes (Signed)
   Covid-19 Vaccination Clinic  Name:  Bridget Flynn    MRN: [id number] DOB: [birth year]  11/05/2019  Ms. Job was observed post Covid-19 immunization for 15 minutes without incidence. She was provided with Vaccine Information Sheet and instruction to access the V-Safe system.   [name] was instructed to call 911 with any severe reactions post vaccine: Marland Kitchen Difficulty breathing  . Swelling of your face and throat  . A fast heartbeat  . A bad rash all over your body  . Dizziness and weakness    Immunizations Administered    Name Date Dose VIS Date Route   Pfizer COVID-19 Vaccine 11/05/2019 10:33 AM 0.3 mL 09/05/2019 Intramuscular   Manufacturer: [employer], [employer]   Lot: [id number]   NDC: [address]-3

## 2019-11-11 ENCOUNTER — Ambulatory Visit: Payer: BC Managed Care – PPO

## 2019-12-01 ENCOUNTER — Other Ambulatory Visit: Payer: Self-pay | Admitting: Family Medicine

## 2020-02-21 ENCOUNTER — Other Ambulatory Visit: Payer: Self-pay | Admitting: Family Medicine

## 2020-05-15 ENCOUNTER — Other Ambulatory Visit: Payer: Self-pay | Admitting: Family Medicine

## 2020-05-17 NOTE — Telephone Encounter (Signed)
Patient need to schedule an ov for more refills. 

## 2020-05-19 ENCOUNTER — Other Ambulatory Visit: Payer: Self-pay

## 2020-05-19 ENCOUNTER — Ambulatory Visit (INDEPENDENT_AMBULATORY_CARE_PROVIDER_SITE_OTHER): Payer: Medicare Other | Admitting: Family Medicine

## 2020-05-19 ENCOUNTER — Encounter: Payer: Self-pay | Admitting: Family Medicine

## 2020-05-19 VITALS — BP 120/80 | HR 67 | Temp 98.1°F | Wt 139.3 lb

## 2020-05-19 DIAGNOSIS — J4520 Mild intermittent asthma, uncomplicated: Secondary | ICD-10-CM

## 2020-05-19 DIAGNOSIS — E785 Hyperlipidemia, unspecified: Secondary | ICD-10-CM | POA: Diagnosis not present

## 2020-05-19 DIAGNOSIS — I10 Essential (primary) hypertension: Secondary | ICD-10-CM | POA: Diagnosis not present

## 2020-05-19 MED ORDER — ALBUTEROL SULFATE HFA 108 (90 BASE) MCG/ACT IN AERS
2.0000 | INHALATION_SPRAY | RESPIRATORY_TRACT | 1 refills | Status: DC | PRN
Start: 2020-05-19 — End: 2020-12-20

## 2020-05-19 MED ORDER — HYDROCHLOROTHIAZIDE 25 MG PO TABS
25.0000 mg | ORAL_TABLET | Freq: Every day | ORAL | 3 refills | Status: DC | PRN
Start: 2020-05-19 — End: 2020-10-01

## 2020-05-19 MED ORDER — ROSUVASTATIN CALCIUM 20 MG PO TABS
20.0000 mg | ORAL_TABLET | Freq: Every day | ORAL | 3 refills | Status: DC
Start: 2020-05-19 — End: 2020-10-01

## 2020-05-19 NOTE — Progress Notes (Signed)
Established Patient Office Visit  Subjective:  Patient ID: Bridget Flynn, female    DOB: [birth year]  Age: 64 y.o. MRN: [id number]  CC:  Chief Complaint  Patient presents with  . Medication Management    HPI Bridget Flynn presents for request for refill of medications.  She specifically needs refills of HCTZ, Crestor, and Proventil.  She does not use Proventil very often.  She usually gets her physical around September and plans to get lab work then.  Generally doing well.  She has been staying busy with 4 grandchildren and a 5th on the way.  Compliant with medications and denies any side effects.  Blood pressures have been stable and well controlled.  No recent dizziness.  No significant myalgias.  She has had Covid vaccine.  Plans to get booster when available  Past Medical History:  Diagnosis Date  . ALLERGIC RHINITIS 11/25/2007   Qualifier: Diagnosis of  By: Ronnald Ramp CNA/MA, Janett Billow    . Allergy   . Asthma   . HYPERLIPIDEMIA 07/12/2009   Qualifier: Diagnosis of  By: Valma Cava LPN, Izora Gala    . Osteopenia   . VITAMIN D DEFICIENCY 07/12/2009   Qualifier: Diagnosis of  By: Valma Cava LPN, Izora Gala      Past Surgical History:  Procedure Laterality Date  . COLONOSCOPY W/ POLYPECTOMY  2004  . DILATION AND CURETTAGE OF UTERUS    . LAPAROSCOPIC BILATERAL SALPINGECTOMY  08/24/08    Family History  Problem Relation Age of Onset  . Diabetes Mother   . Cancer Mother        ovarian cancer  . Diabetes Father   . Cancer Father        Hodgkins lymphoma  . Diabetes Other   . Cancer Other        ovarian  . BRCA 1/2 Neg Hx   . Colon cancer Neg Hx   . Colon polyps Neg Hx   . Esophageal cancer Neg Hx   . Rectal cancer Neg Hx   . Stomach cancer Neg Hx   . Breast cancer Neg Hx     Social History   Socioeconomic History  . Marital status: Married    Spouse name: Not on file  . Number of children: Not on file  . Years of education: Not on file  . Highest education level: Not on file    Occupational History  . Not on file  Tobacco Use  . Smoking status: Never Smoker  . Smokeless tobacco: Never Used  Vaping Use  . Vaping Use: Never used  Substance and Sexual Activity  . Alcohol use: Not Currently    Alcohol/week: 1.0 standard drink    Types: 1 Standard drinks or equivalent per week    Comment: occ glass of wine  . Drug use: No  . Sexual activity: Yes    Partners: Male    Birth control/protection: Other-see comments    Comment: BSO  Other Topics Concern  . Not on file  Social History Narrative  . Not on file   Social Determinants of Health   Financial Resource Strain:   . Difficulty of Paying Living Expenses: Not on file  Food Insecurity:   . Worried About Charity fundraiser in the Last Year: Not on file  . Ran Out of Food in the Last Year: Not on file  Transportation Needs:   . Lack of Transportation (Medical): Not on file  . Lack of Transportation (Non-Medical): Not on file  Physical Activity:   .  Days of Exercise per Week: Not on file  . Minutes of Exercise per Session: Not on file  Stress:   . Feeling of Stress : Not on file  Social Connections:   . Frequency of Communication with Friends and Family: Not on file  . Frequency of Social Gatherings with Friends and Family: Not on file  . Attends Religious Services: Not on file  . Active Member of Clubs or Organizations: Not on file  . Attends Archivist Meetings: Not on file  . Marital Status: Not on file  Intimate Partner Violence:   . Fear of Current or Ex-Partner: Not on file  . Emotionally Abused: Not on file  . Physically Abused: Not on file  . Sexually Abused: Not on file    Outpatient Medications Prior to Visit  Medication Sig Dispense Refill  . B Complex Vitamins (VITAMIN-B COMPLEX PO) Take by mouth daily. Takes only when fatigued.    . Chlorpheniramine Maleate (CHLOR-TABLETS PO) Take by mouth daily as needed.     . Cholecalciferol (VITAMIN D3) 1000 UNITS CAPS Take by mouth  3 (three) times a week. Takes once per week.    Marland Kitchen Fexofenadine HCl (MUCINEX ALLERGY PO) Take by mouth daily as needed.     . fluticasone (FLONASE) 50 MCG/ACT nasal spray Place into both nostrils daily.    Marland Kitchen albuterol (PROVENTIL HFA) 108 (90 Base) MCG/ACT inhaler Inhale 2 puffs into the lungs every 4 (four) hours as needed. For wheezing 1 Inhaler 0  . hydrochlorothiazide (HYDRODIURIL) 25 MG tablet TAKE ONE TABLET BY MOUTH DAILY AS NEEDED 90 tablet 0  . rosuvastatin (CRESTOR) 20 MG tablet TAKE 1 TABLET BY MOUTH EVERY DAY 90 tablet 3   Facility-Administered Medications Prior to Visit  Medication Dose Route Frequency Provider Last Rate Last Admin  . 0.9 %  sodium chloride infusion  500 mL Intravenous Once Nandigam, Venia Minks, MD        Allergies  Allergen Reactions  . Allegra [Fexofenadine Hcl]     Depression, cryingspells  . Biaxin [Clarithromycin]     REACTION: diarrhea, vomiting  . Erythromycin     REACTION: GI upset  . Lipitor [Atorvastatin] Other (See Comments)    arthralgias  . Sulfa Antibiotics     ROS Review of Systems  Constitutional: Negative for chills, fatigue and fever.  Eyes: Negative for visual disturbance.  Respiratory: Negative for cough, chest tightness, shortness of breath and wheezing.   Cardiovascular: Negative for chest pain, palpitations and leg swelling.  Neurological: Negative for dizziness, seizures, syncope, weakness, light-headedness and headaches.      Objective:    Physical Exam Vitals reviewed.  Constitutional:      Appearance: Normal appearance.  Cardiovascular:     Rate and Rhythm: Normal rate and regular rhythm.  Pulmonary:     Effort: Pulmonary effort is normal.     Breath sounds: Normal breath sounds.  Musculoskeletal:     Cervical back: Neck supple.     Right lower leg: No edema.     Left lower leg: No edema.  Lymphadenopathy:     Cervical: No cervical adenopathy.  Neurological:     Mental Status: She is alert.     BP 120/80  (BP Location: Left Arm, Patient Position: Sitting, Cuff Size: Normal)   Pulse 67   Temp 98.1 F (36.7 C) (Oral)   Wt 139 lb 4.8 oz (63.2 kg)   LMP 10/26/2005   SpO2 97%   BMI 24.68 kg/m  Wt  Readings from Last 3 Encounters:  05/19/20 139 lb 4.8 oz (63.2 kg)  08/05/19 152 lb 6.4 oz (69.1 kg)  06/04/19 149 lb 3.2 oz (67.7 kg)     Health Maintenance Due  Topic Date Due  . PNA vac Low Risk Adult (1 of 2 - PCV13) 09/06/2019    There are no preventive care reminders to display for this patient.  Lab Results  Component Value Date   TSH 2.97 06/04/2019   Lab Results  Component Value Date   WBC 7.2 06/04/2019   HGB 13.7 06/04/2019   HCT 40.0 06/04/2019   MCV 85.5 06/04/2019   PLT 315.0 06/04/2019   Lab Results  Component Value Date   NA 138 06/04/2019   K 3.6 06/04/2019   CO2 37 (H) 06/04/2019   GLUCOSE 91 06/04/2019   BUN 18 06/04/2019   CREATININE 0.71 06/04/2019   BILITOT 0.4 06/04/2019   ALKPHOS 45 06/04/2019   AST 22 06/04/2019   ALT 21 06/04/2019   PROT 7.3 06/04/2019   ALBUMIN 4.6 06/04/2019   CALCIUM 9.9 06/04/2019   GFR 82.68 06/04/2019   Lab Results  Component Value Date   CHOL 141 06/04/2019   Lab Results  Component Value Date   HDL 46.80 06/04/2019   Lab Results  Component Value Date   LDLCALC 82 06/04/2019   Lab Results  Component Value Date   TRIG 58.0 06/04/2019   Lab Results  Component Value Date   CHOLHDL 3 06/04/2019   Lab Results  Component Value Date   HGBA1C 5.3 07/03/2016      Assessment & Plan:   Problem List Items Addressed This Visit      Unprioritized   Essential hypertension - Primary   Relevant Medications   hydrochlorothiazide (HYDRODIURIL) 25 MG tablet   rosuvastatin (CRESTOR) 20 MG tablet   Hyperlipidemia   Relevant Medications   hydrochlorothiazide (HYDRODIURIL) 25 MG tablet   rosuvastatin (CRESTOR) 20 MG tablet    Refilled medications as above.  Patient plans to schedule complete physical for September  or October.  Meds ordered this encounter  Medications  . albuterol (PROVENTIL HFA) 108 (90 Base) MCG/ACT inhaler    Sig: Inhale 2 puffs into the lungs every 4 (four) hours as needed. For wheezing    Dispense:  2 each    Refill:  1  . hydrochlorothiazide (HYDRODIURIL) 25 MG tablet    Sig: Take 1 tablet (25 mg total) by mouth daily as needed.    Dispense:  90 tablet    Refill:  3  . rosuvastatin (CRESTOR) 20 MG tablet    Sig: Take 1 tablet (20 mg total) by mouth daily.    Dispense:  90 tablet    Refill:  3    Follow-up: No follow-ups on file.    Carolann Littler, MD

## 2020-06-17 ENCOUNTER — Ambulatory Visit: Payer: Medicare Other | Attending: Internal Medicine

## 2020-06-17 DIAGNOSIS — Z23 Encounter for immunization: Secondary | ICD-10-CM

## 2020-06-17 NOTE — Progress Notes (Signed)
   Covid-19 Vaccination Clinic  Name:  Bridget Flynn    MRN: [id number] DOB: Feb 11, 1956  06/17/2020  Bridget Flynn was observed post Covid-19 immunization for 15 minutes without incident. She was provided with Vaccine Information Sheet and instruction to access the V-Safe system.   Bridget Flynn was instructed to call 911 with any severe reactions post vaccine: Marland Kitchen Difficulty breathing  . Swelling of face and throat  . A fast heartbeat  . A bad rash all over body  . Dizziness and weakness

## 2020-07-09 ENCOUNTER — Telehealth (INDEPENDENT_AMBULATORY_CARE_PROVIDER_SITE_OTHER): Payer: Medicare Other | Admitting: Family Medicine

## 2020-07-09 DIAGNOSIS — J019 Acute sinusitis, unspecified: Secondary | ICD-10-CM

## 2020-07-09 MED ORDER — AMOXICILLIN-POT CLAVULANATE 875-125 MG PO TABS: 1.0000 | ORAL_TABLET | Freq: Two times a day (BID) | ORAL | 0 refills | Status: DC

## 2020-07-09 NOTE — Progress Notes (Signed)
Patient ID: Bridget Flynn, female   DOB: 1955/12/10, 64 y.o.   MRN: [id number]  This visit type was conducted due to national recommendations for restrictions regarding the COVID-19 pandemic in an effort to limit this patient's exposure and mitigate transmission in our community.   Virtual Visit via Telephone Note  I connected with Bridget Flynn on 07/09/20 at 10:30 AM EDT by telephone and verified that I am speaking with the correct person using two identifiers.   I discussed the limitations, risks, security and privacy concerns of performing an evaluation and management service by telephone and the availability of in person appointments. I also discussed with the patient that there may be a patient responsible charge related to this service. The patient expressed understanding and agreed to proceed.  Location patient: home Location provider: work or home office Participants present for the call: patient, provider Patient did not have a visit in the prior 7 days to address this/these issue(s).   History of Present Illness:  Bridget Flynn called with recent symptoms of some headache, sinus pressure, malaise for over 2 weeks.  Her pressure is mostly in the cheek and forehead region.  Tylenol helps slightly.  Her symptoms are actually somewhat better today compared with earlier in the week.  She has had severe sinus infections in the past.  Denies any colored nasal discharge or any bloody nasal discharge.  No cough.  No fever.  Allergy to sulfa and intolerance to Biaxin  Past Medical History:  Diagnosis Date  . ALLERGIC RHINITIS 11/25/2007   Qualifier: Diagnosis of  By: Yetta Barre CNA/MA, Shanda Bumps    . Allergy   . Asthma   . HYPERLIPIDEMIA 07/12/2009   Qualifier: Diagnosis of  By: Gabriel Rung LPN, Harriett Sine    . Osteopenia   . VITAMIN D DEFICIENCY 07/12/2009   Qualifier: Diagnosis of  By: Gabriel Rung LPN, Harriett Sine     Past Surgical History:  Procedure Laterality Date  . COLONOSCOPY W/ POLYPECTOMY  2004  . DILATION AND  CURETTAGE OF UTERUS    . LAPAROSCOPIC BILATERAL SALPINGECTOMY  08/24/08    reports that she has never smoked. She has never used smokeless tobacco. She reports previous alcohol use of about 1.0 standard drink of alcohol per week. She reports that she does not use drugs. family history includes Cancer in her father, mother, and another family member; Diabetes in her father, mother, and another family member. Allergies  Allergen Reactions  . Allegra [Fexofenadine Hcl]     Depression, cryingspells  . Biaxin [Clarithromycin]     REACTION: diarrhea, vomiting  . Erythromycin     REACTION: GI upset  . Lipitor [Atorvastatin] Other (See Comments)    arthralgias  . Sulfa Antibiotics       Observations/Objective: Patient sounds cheerful and well on the phone. I do not appreciate any SOB. Speech and thought processing are grossly intact. Patient reported vitals:  Assessment and Plan:  Probable acute sinusitis  -Given duration of symptoms consider treatment with Augmentin 875 mg twice daily with food for 10 days -Stay well-hydrated -Continue other conservative therapies such as Mucinex and topical moist heat -Follow-up for any persistent or worsening symptoms  Follow Up Instructions:  -As above   99441 5-10 [id number] 11-20 [id number] 21-30 I did not refer this patient for an OV in the next 24 hours for this/these issue(s).  I discussed the assessment and treatment plan with the patient. The patient was provided an opportunity to ask questions and all were answered. The patient agreed with  the plan and demonstrated an understanding of the instructions.   The patient was advised to call back or seek an in-person evaluation if the symptoms worsen or if the condition fails to improve as anticipated.  I provided  12 minutes of non-face-to-face time during this encounter.   Bridget Peat, MD

## 2020-08-02 ENCOUNTER — Other Ambulatory Visit: Payer: Self-pay | Admitting: Obstetrics & Gynecology

## 2020-08-02 DIAGNOSIS — Z1231 Encounter for screening mammogram for malignant neoplasm of breast: Secondary | ICD-10-CM

## 2020-08-03 ENCOUNTER — Other Ambulatory Visit: Payer: Self-pay

## 2020-08-03 ENCOUNTER — Ambulatory Visit
Admission: RE | Admit: 2020-08-03 | Discharge: 2020-08-03 | Disposition: A | Discharge status: Home / Self Care | Payer: Medicare Other | Source: Ambulatory Visit | Attending: Obstetrics & Gynecology | Admitting: Obstetrics & Gynecology

## 2020-08-03 DIAGNOSIS — Z1231 Encounter for screening mammogram for malignant neoplasm of breast: Secondary | ICD-10-CM

## 2020-09-06 ENCOUNTER — Other Ambulatory Visit: Payer: Self-pay

## 2020-09-06 ENCOUNTER — Ambulatory Visit (INDEPENDENT_AMBULATORY_CARE_PROVIDER_SITE_OTHER): Payer: Medicare Other | Admitting: Family Medicine

## 2020-09-06 ENCOUNTER — Encounter: Payer: Self-pay | Admitting: Family Medicine

## 2020-09-06 VITALS — BP 126/80 | HR 56 | Temp 98.1°F | Ht 63.0 in | Wt 136.5 lb

## 2020-09-06 DIAGNOSIS — Z23 Encounter for immunization: Secondary | ICD-10-CM | POA: Diagnosis not present

## 2020-09-06 DIAGNOSIS — Z0000 Encounter for general adult medical examination without abnormal findings: Secondary | ICD-10-CM

## 2020-09-06 DIAGNOSIS — M8580 Other specified disorders of bone density and structure, unspecified site: Secondary | ICD-10-CM | POA: Diagnosis not present

## 2020-09-06 DIAGNOSIS — I10 Essential (primary) hypertension: Secondary | ICD-10-CM

## 2020-09-06 DIAGNOSIS — E785 Hyperlipidemia, unspecified: Secondary | ICD-10-CM

## 2020-09-06 NOTE — Progress Notes (Signed)
Established Patient Office Visit  Subjective:  Patient ID: Bridget Flynn, female    DOB: 1956/01/09  Age: 64 y.o. MRN: [id number]  CC:  Chief Complaint  Patient presents with  . Annual Exam    HPI Bridget Flynn presents for medical follow-up.  She has chronic problems including hyperlipidemia, hypertension, history of osteopenia.  She still sees GYN regularly.  She has not had pneumonia vaccine since turning 63 over a year ago.  She takes HCTZ for hypertension.  She is on Crestor 1 every other day for hyperlipidemia.  Non-smoker.  Stays very active.  Generally feels well.  No recent falls.  No recent chest pains.  Compliant with medications.  Denies any medication side effects.  Past Medical History:  Diagnosis Date  . ALLERGIC RHINITIS 11/25/2007   Qualifier: Diagnosis of  By: Ronnald Ramp CNA/MA, Janett Billow    . Allergy   . Asthma   . HYPERLIPIDEMIA 07/12/2009   Qualifier: Diagnosis of  By: Valma Cava LPN, Izora Gala    . Osteopenia   . VITAMIN D DEFICIENCY 07/12/2009   Qualifier: Diagnosis of  By: Valma Cava LPN, Izora Gala      Past Surgical History:  Procedure Laterality Date  . COLONOSCOPY W/ POLYPECTOMY  2004  . DILATION AND CURETTAGE OF UTERUS    . LAPAROSCOPIC BILATERAL SALPINGECTOMY  08/24/08    Family History  Problem Relation Age of Onset  . Diabetes Mother   . Cancer Mother        ovarian cancer  . Diabetes Father   . Cancer Father        Hodgkins lymphoma  . Diabetes Other   . Cancer Other        ovarian  . BRCA 1/2 Neg Hx   . Colon cancer Neg Hx   . Colon polyps Neg Hx   . Esophageal cancer Neg Hx   . Rectal cancer Neg Hx   . Stomach cancer Neg Hx   . Breast cancer Neg Hx     Social History   Socioeconomic History  . Marital status: Married    Spouse name: Not on file  . Number of children: Not on file  . Years of education: Not on file  . Highest education level: Not on file  Occupational History  . Not on file  Tobacco Use  . Smoking status: Never Smoker  .  Smokeless tobacco: Never Used  Vaping Use  . Vaping Use: Never used  Substance and Sexual Activity  . Alcohol use: Not Currently    Alcohol/week: 1.0 standard drink    Types: 1 Standard drinks or equivalent per week    Comment: occ glass of wine  . Drug use: No  . Sexual activity: Yes    Partners: Male    Birth control/protection: Other-see comments    Comment: BSO  Other Topics Concern  . Not on file  Social History Narrative  . Not on file   Social Determinants of Health   Financial Resource Strain: Not on file  Food Insecurity: Not on file  Transportation Needs: Not on file  Physical Activity: Not on file  Stress: Not on file  Social Connections: Not on file  Intimate Partner Violence: Not on file    Outpatient Medications Prior to Visit  Medication Sig Dispense Refill  . albuterol (PROVENTIL HFA) 108 (90 Base) MCG/ACT inhaler Inhale 2 puffs into the lungs every 4 (four) hours as needed. For wheezing 2 each 1  . B Complex Vitamins (VITAMIN-B COMPLEX  PO) Take by mouth daily. Takes only when fatigued.    . Chlorpheniramine Maleate (CHLOR-TABLETS PO) Take by mouth daily as needed.     . Cholecalciferol (VITAMIN D3) 1000 UNITS CAPS Take by mouth 3 (three) times a week. Takes once per week.    Marland Kitchen Fexofenadine HCl (MUCINEX ALLERGY PO) Take by mouth daily as needed.     . fluticasone (FLONASE) 50 MCG/ACT nasal spray Place into both nostrils daily.    . hydrochlorothiazide (HYDRODIURIL) 25 MG tablet Take 1 tablet (25 mg total) by mouth daily as needed. 90 tablet 3  . rosuvastatin (CRESTOR) 20 MG tablet Take 1 tablet (20 mg total) by mouth daily. 90 tablet 3  . amoxicillin-clavulanate (AUGMENTIN) 875-125 MG tablet Take 1 tablet by mouth 2 (two) times daily. 20 tablet 0   Facility-Administered Medications Prior to Visit  Medication Dose Route Frequency Provider Last Rate Last Admin  . 0.9 %  sodium chloride infusion  500 mL Intravenous Once Nandigam, Venia Minks, MD         Allergies  Allergen Reactions  . Allegra [Fexofenadine Hcl]     Depression, cryingspells  . Biaxin [Clarithromycin]     REACTION: diarrhea, vomiting  . Erythromycin     REACTION: GI upset  . Lipitor [Atorvastatin] Other (See Comments)    arthralgias  . Sulfa Antibiotics     ROS Review of Systems  Constitutional: Negative for fatigue.  Eyes: Negative for visual disturbance.  Respiratory: Negative for cough, chest tightness, shortness of breath and wheezing.   Cardiovascular: Negative for chest pain, palpitations and leg swelling.  Neurological: Negative for dizziness, seizures, syncope, weakness, light-headedness and headaches.      Objective:    Physical Exam Constitutional:      Appearance: She is well-developed and well-nourished.  Eyes:     Pupils: Pupils are equal, round, and reactive to light.  Neck:     Thyroid: No thyromegaly.     Vascular: No JVD.  Cardiovascular:     Rate and Rhythm: Normal rate and regular rhythm.     Heart sounds: No gallop.   Pulmonary:     Effort: Pulmonary effort is normal. No respiratory distress.     Breath sounds: Normal breath sounds. No wheezing or rales.  Musculoskeletal:        General: No edema.     Cervical back: Neck supple.  Neurological:     Mental Status: She is alert.     BP 126/80 (BP Location: Left Arm, Patient Position: Sitting, Cuff Size: Normal)   Pulse (!) 56   Temp 98.1 F (36.7 C) (Temporal)   Ht 5' 3"  (1.6 m)   Wt 136 lb 8 oz (61.9 kg)   LMP 10/26/2005   SpO2 95%   BMI 24.18 kg/m  Wt Readings from Last 3 Encounters:  09/06/20 136 lb 8 oz (61.9 kg)  05/19/20 139 lb 4.8 oz (63.2 kg)  08/05/19 152 lb 6.4 oz (69.1 kg)     Health Maintenance Due  Topic Date Due  . PNA vac Low Risk Adult (1 of 2 - PCV13) 09/06/2019    There are no preventive care reminders to display for this patient.  Lab Results  Component Value Date   TSH 2.97 06/04/2019   Lab Results  Component Value Date   WBC 7.2  06/04/2019   HGB 13.7 06/04/2019   HCT 40.0 06/04/2019   MCV 85.5 06/04/2019   PLT 315.0 06/04/2019   Lab Results  Component Value Date  NA 138 06/04/2019   K 3.6 06/04/2019   CO2 37 (H) 06/04/2019   GLUCOSE 91 06/04/2019   BUN 18 06/04/2019   CREATININE 0.71 06/04/2019   BILITOT 0.4 06/04/2019   ALKPHOS 45 06/04/2019   AST 22 06/04/2019   ALT 21 06/04/2019   PROT 7.3 06/04/2019   ALBUMIN 4.6 06/04/2019   CALCIUM 9.9 06/04/2019   GFR 82.68 06/04/2019   Lab Results  Component Value Date   CHOL 141 06/04/2019   Lab Results  Component Value Date   HDL 46.80 06/04/2019   Lab Results  Component Value Date   LDLCALC 82 06/04/2019   Lab Results  Component Value Date   TRIG 58.0 06/04/2019   Lab Results  Component Value Date   CHOLHDL 3 06/04/2019   Lab Results  Component Value Date   HGBA1C 5.3 07/03/2016      Assessment & Plan:   #1 hypertension stable and at goal -Continue HCTZ -Check basic metabolic panel  #2 dyslipidemia.  Patient on Crestor -Check lipid and hepatic panel  #3 history of osteopenia -Continue regular weightbearing exercise and daily calcium and vitamin D. -Recommend follow-up DEXA at some point this year.  She will consider getting this when she gets a follow-up mammogram at [facility]  No orders of the defined types were placed in this encounter.   Follow-up: No follow-ups on file.    Carolann Littler, MD

## 2020-09-06 NOTE — Patient Instructions (Signed)
Preventive Care 38 Years and Older, Female Preventive care refers to lifestyle choices and visits with your health care provider that can promote health and wellness. This includes:  A yearly physical exam. This is also called an annual well check.  Regular dental and eye exams.  Immunizations.  Screening for certain conditions.  Healthy lifestyle choices, such as diet and exercise. What can I expect for my preventive care visit? Physical exam Your health care provider will check:  Height and weight. These may be used to calculate body mass index (BMI), which is a measurement that tells if you are at a healthy weight.  Heart rate and blood pressure.  Your skin for abnormal spots. Counseling Your health care provider may ask you questions about:  Alcohol, tobacco, and drug use.  Emotional well-being.  Home and relationship well-being.  Sexual activity.  Eating habits.  History of falls.  Memory and ability to understand (cognition).  Work and work Statistician.  Pregnancy and menstrual history. What immunizations do I need?  Influenza (flu) vaccine  This is recommended every year. Tetanus, diphtheria, and pertussis (Tdap) vaccine  You may need a Td booster every 10 years. Varicella (chickenpox) vaccine  You may need this vaccine if you have not already been vaccinated. Zoster (shingles) vaccine  You may need this after age 33. Pneumococcal conjugate (PCV13) vaccine  One dose is recommended after age 33. Pneumococcal polysaccharide (PPSV23) vaccine  One dose is recommended after age 72. Measles, mumps, and rubella (MMR) vaccine  You may need at least one dose of MMR if you were born in 1957 or later. You may also need a second dose. Meningococcal conjugate (MenACWY) vaccine  You may need this if you have certain conditions. Hepatitis A vaccine  You may need this if you have certain conditions or if you travel or work in places where you may be exposed  to hepatitis A. Hepatitis B vaccine  You may need this if you have certain conditions or if you travel or work in places where you may be exposed to hepatitis B. Haemophilus influenzae type b (Hib) vaccine  You may need this if you have certain conditions. You may receive vaccines as individual doses or as more than one vaccine together in one shot (combination vaccines). Talk with your health care provider about the risks and benefits of combination vaccines. What tests do I need? Blood tests  Lipid and cholesterol levels. These may be checked every 5 years, or more frequently depending on your overall health.  Hepatitis C test.  Hepatitis B test. Screening  Lung cancer screening. You may have this screening every year starting at age 39 if you have a 30-pack-year history of smoking and currently smoke or have quit within the past 15 years.  Colorectal cancer screening. All adults should have this screening starting at age 36 and continuing until age 15. Your health care provider may recommend screening at age 23 if you are at increased risk. You will have tests every 1-10 years, depending on your results and the type of screening test.  Diabetes screening. This is done by checking your blood sugar (glucose) after you have not eaten for a while (fasting). You may have this done every 1-3 years.  Mammogram. This may be done every 1-2 years. Talk with your health care provider about how often you should have regular mammograms.  BRCA-related cancer screening. This may be done if you have a family history of breast, ovarian, tubal, or peritoneal cancers.  Other tests  Sexually transmitted disease (STD) testing.  Bone density scan. This is done to screen for osteoporosis. You may have this done starting at age 44. Follow these instructions at home: Eating and drinking  Eat a diet that includes fresh fruits and vegetables, whole grains, lean protein, and low-fat dairy products. Limit  your intake of foods with high amounts of sugar, saturated fats, and salt.  Take vitamin and mineral supplements as recommended by your health care provider.  Do not drink alcohol if your health care provider tells you not to drink.  If you drink alcohol: ? Limit how much you have to 0-1 drink a day. ? Be aware of how much alcohol is in your drink. In the [place] one drink equals one 12 oz bottle of beer (355 mL), one 5 oz glass of wine (148 mL), or one 1 oz glass of hard liquor (44 mL). Lifestyle  Take daily care of your teeth and gums.  Stay active. Exercise for at least 30 minutes on 5 or more days each week.  Do not use any products that contain nicotine or tobacco, such as cigarettes, e-cigarettes, and chewing tobacco. If you need help quitting, ask your health care provider.  If you are sexually active, practice safe sex. Use a condom or other form of protection in order to prevent STIs (sexually transmitted infections).  Talk with your health care provider about taking a low-dose aspirin or statin. What's next?  Go to your health care provider once a year for a well check visit.  Ask your health care provider how often you should have your eyes and teeth checked.  Stay up to date on all vaccines. This information is not intended to replace advice given to you by your health care provider. Make sure you discuss any questions you have with your health care provider. Document Revised: 09/05/2018 Document Reviewed: 09/05/2018 Elsevier Patient Education  2020 [employer].

## 2020-09-07 LAB — HEPATIC FUNCTION PANEL
AG Ratio: 1.8 (calc) (ref 1.0–2.5)
ALT: 22 U/L (ref 6–29)
AST: 18 U/L (ref 10–35)
Albumin: 4.7 g/dL (ref 3.6–5.1)
Alkaline phosphatase (APISO): 40 U/L (ref 37–153)
Bilirubin, Direct: 0.2 mg/dL (ref 0.0–0.2)
Globulin: 2.6 g/dL (calc) (ref 1.9–3.7)
Indirect Bilirubin: 0.5 mg/dL (calc) (ref 0.2–1.2)
Total Bilirubin: 0.7 mg/dL (ref 0.2–1.2)
Total Protein: 7.3 g/dL (ref 6.1–8.1)

## 2020-09-07 LAB — LIPID PANEL
Cholesterol: 142 mg/dL (ref <200)
HDL: 59 mg/dL (ref >=50)
LDL Cholesterol (Calc): 69 mg/dL (calc)
Non-HDL Cholesterol (Calc): 83 mg/dL (calc) (ref <130)
Total CHOL/HDL Ratio: 2.4 (calc) (ref <5.0)
Triglycerides: 60 mg/dL (ref <150)

## 2020-09-07 LAB — BASIC METABOLIC PANEL
BUN: 13 mg/dL (ref 7–25)
CO2: 34 mmol/L — ABNORMAL HIGH (ref 20–32)
Calcium: 10.4 mg/dL (ref 8.6–10.4)
Chloride: 97 mmol/L — ABNORMAL LOW (ref 98–110)
Creat: 0.63 mg/dL (ref 0.50–0.99)
Glucose, Bld: 101 mg/dL — ABNORMAL HIGH (ref 65–99)
Potassium: 4.3 mmol/L (ref 3.5–5.3)
Sodium: 141 mmol/L (ref 135–146)

## 2020-10-01 ENCOUNTER — Other Ambulatory Visit: Payer: Self-pay

## 2020-10-01 MED ORDER — ROSUVASTATIN CALCIUM 20 MG PO TABS: 20.0000 mg | ORAL_TABLET | Freq: Every day | ORAL | 3 refills | Status: DC

## 2020-10-01 MED ORDER — HYDROCHLOROTHIAZIDE 25 MG PO TABS: 25.0000 mg | ORAL_TABLET | Freq: Every day | ORAL | 3 refills | Status: DC | PRN

## 2020-10-29 ENCOUNTER — Ambulatory Visit: Payer: BC Managed Care – PPO

## 2020-12-01 NOTE — Progress Notes (Unsigned)
Subjective:   Bridget Flynn is a 65 y.o. female who presents for an Initial Medicare Annual Wellness Visit.  I connected with Lenise Herald  today by telephone and verified that I am speaking with the correct person using two identifiers. Location patient: home Location provider: work Persons participating in the virtual visit: patient, provider.   I discussed the limitations, risks, security and privacy concerns of performing an evaluation and management service by telephone and the availability of in person appointments. I also discussed with the patient that there may be a patient responsible charge related to this service. The patient expressed understanding and verbally consented to this telephonic visit.    Interactive audio and video telecommunications were attempted between this provider and patient, however failed, due to patient having technical difficulties OR patient did not have access to video capability.  We continued and completed visit with audio only.      Review of Systems    N/A  Cardiac Risk Factors include: advanced age (>59mn, >>58women);dyslipidemia;hypertension     Objective:    Today's Vitals   There is no height or weight on file to calculate BMI.  Advanced Directives 12/02/2020 01/21/2018  Does Patient Have a Medical Advance Directive? Yes Yes  Type of [place] Living will;Healthcare Power of Attorney  Does patient want to make changes to medical advance directive? No - Patient declined No - Patient declined  [place]? No - copy requested No - copy requested    Current Medications (verified) Outpatient Encounter Medications as of 12/02/2020  Medication Sig  . B Complex Vitamins (VITAMIN-B COMPLEX PO) Take by mouth daily. Takes only when fatigued.  . Chlorpheniramine Maleate (CHLOR-TABLETS PO) Take by mouth daily as needed.   . Cholecalciferol (VITAMIN D3) 1000 UNITS  CAPS Take by mouth 3 (three) times a week. Takes once per week.  .Marland KitchenFexofenadine HCl (MUCINEX ALLERGY PO) Take by mouth daily as needed.   . fluticasone (FLONASE) 50 MCG/ACT nasal spray Place into both nostrils daily.  . hydrochlorothiazide (HYDRODIURIL) 25 MG tablet Take 1 tablet (25 mg total) by mouth daily as needed.  . rosuvastatin (CRESTOR) 20 MG tablet Take 1 tablet (20 mg total) by mouth daily.  .Marland Kitchenalbuterol (PROVENTIL HFA) 108 (90 Base) MCG/ACT inhaler Inhale 2 puffs into the lungs every 4 (four) hours as needed. For wheezing (Patient not taking: Reported on 12/02/2020)   Facility-Administered Encounter Medications as of 12/02/2020  Medication  . 0.9 %  sodium chloride infusion    Allergies (verified) Allegra [fexofenadine hcl], Biaxin [clarithromycin], Erythromycin, Lipitor [atorvastatin], and Sulfa antibiotics   History: Past Medical History:  Diagnosis Date  . ALLERGIC RHINITIS 11/25/2007   Qualifier: Diagnosis of  By: JRonnald RampCNA/MA, JJanett Billow   . Allergy   . Asthma   . HYPERLIPIDEMIA 07/12/2009   Qualifier: Diagnosis of  By: NValma CavaLPN, NIzora Gala   . Osteopenia   . VITAMIN D DEFICIENCY 07/12/2009   Qualifier: Diagnosis of  By: NValma CavaLPN, NIzora Gala    Past Surgical History:  Procedure Laterality Date  . COLONOSCOPY W/ POLYPECTOMY  2004  . DILATION AND CURETTAGE OF UTERUS    . LAPAROSCOPIC BILATERAL SALPINGECTOMY  08/24/08   Family History  Problem Relation Age of Onset  . Diabetes Mother   . Cancer Mother        ovarian cancer  . Cirrhosis Mother   . Diabetes Father   .  Cancer Father        Hodgkins lymphoma  . Diabetes Other   . Cancer Other        ovarian  . BRCA 1/2 Neg Hx   . Colon cancer Neg Hx   . Colon polyps Neg Hx   . Esophageal cancer Neg Hx   . Rectal cancer Neg Hx   . Stomach cancer Neg Hx   . Breast cancer Neg Hx    Social History   Socioeconomic History  . Marital status: Married    Spouse name: Not on file  . Number of children: Not on file   . Years of education: Not on file  . Highest education level: Not on file  Occupational History  . Not on file  Tobacco Use  . Smoking status: Never Smoker  . Smokeless tobacco: Never Used  Vaping Use  . Vaping Use: Never used  Substance and Sexual Activity  . Alcohol use: Not Currently    Alcohol/week: 1.0 standard drink    Types: 1 Standard drinks or equivalent per week    Comment: occ glass of wine  . Drug use: No  . Sexual activity: Yes    Partners: Male    Birth control/protection: Other-see comments    Comment: BSO  Other Topics Concern  . Not on file  Social History Narrative  . Not on file   Social Determinants of Health   Financial Resource Strain: Low Risk   . Difficulty of Paying Living Expenses: Not hard at all  Food Insecurity: No Food Insecurity  . Worried About Charity fundraiser in the Last Year: Never true  . Ran Out of Food in the Last Year: Never true  Transportation Needs: No Transportation Needs  . Lack of Transportation (Medical): No  . Lack of Transportation (Non-Medical): No  Physical Activity: Sufficiently Active  . Days of Exercise per Week: 7 days  . Minutes of Exercise per Session: 40 min  Stress: No Stress Concern Present  . Feeling of Stress : Not at all  Social Connections: Moderately Integrated  . Frequency of Communication with Friends and Family: More than three times a week  . Frequency of Social Gatherings with Friends and Family: More than three times a week  . Attends Religious Services: Never  . Active Member of Clubs or Organizations: Yes  . Attends Archivist Meetings: Never  . Marital Status: Married    Tobacco Counseling Counseling given: Not Answered   Clinical Intake:  Pre-visit preparation completed: Yes  Pain : No/denies pain     Nutritional Risks: None Diabetes: No  How often do you need to have someone help you when you read instructions, pamphlets, or other written materials from your doctor  or pharmacy?: 1 - Never  Diabetic?No     Information entered by :: [place] of Daily Living In your present state of health, do you have any difficulty performing the following activities: 12/02/2020 09/06/2020  Hearing? N N  Vision? N N  Difficulty concentrating or making decisions? N N  Walking or climbing stairs? N N  Dressing or bathing? N N  Doing errands, shopping? N N  Preparing Food and eating ? N -  Using the Toilet? N -  In the past six months, have you accidently leaked urine? Y -  Comment Has occassional bladder leakage with sneezing or urgency -  Do you have problems with loss of bowel control? N -  Managing your Medications?  N -  Managing your Finances? N -  Housekeeping or managing your Housekeeping? N -  Some recent data might be hidden    Patient Care Team: Eulas Post, MD as PCP - General  Indicate any recent Medical Services you may have received from other than [facility] providers in the past year (date may be approximate).     Assessment:   This is a routine wellness examination for Ellysia.  Hearing/Vision screen  Hearing Screening   125Hz  250Hz  500Hz  1000Hz  2000Hz  3000Hz  4000Hz  6000Hz  8000Hz   Right ear:           Left ear:           Vision Screening Comments: Patient states gets eyes examined yearly. Wears glasses   Dietary issues and exercise activities discussed: Current Exercise Habits: Home exercise routine, Type of exercise: strength training/weights;walking;yoga, Time (Minutes): 40, Frequency (Times/Week): 7, Weekly Exercise (Minutes/Week): 280, Intensity: Moderate  Goals    . Patient Stated     I would like to stay healthy! I will continue to walk 2 or more miles per day.       Depression Screen PHQ 2/9 Scores 12/02/2020 09/06/2020 06/04/2019 07/24/2018  PHQ - 2 Score 0 0 0 0  PHQ- 9 Score - - 0 -    Fall Risk Fall Risk  12/02/2020 09/06/2020 06/04/2019  Falls in the past year? 0 0 0  Number falls in past yr: 0 0 0   Injury with Fall? 0 0 0  Risk for fall due to : No Fall Risks - -  Follow up Falls evaluation completed;Falls prevention discussed Falls evaluation completed -    FALL RISK PREVENTION PERTAINING TO THE HOME:  Any stairs in or around the home? Yes  If so, are there any without handrails? No  Home free of loose throw rugs in walkways, pet beds, electrical cords, etc? Yes  Adequate lighting in your home to reduce risk of falls? Yes   ASSISTIVE DEVICES UTILIZED TO PREVENT FALLS:  Life alert? No  Use of a cane, walker or w/c? No  Grab bars in the bathroom? No  Shower chair or bench in shower? No  Elevated toilet seat or a handicapped toilet? No     Cognitive Function:   Normal cognitive status assessed by direct observation by this Nurse Health Advisor. No abnormalities found.        Immunizations Immunization History  Administered Date(s) Administered  . Fluad Quad(high Dose 65+) 06/04/2019  . Influenza Whole 07/12/2009, 08/04/2010  . Influenza,inj,Quad PF,6+ Mos 08/19/2013, 06/11/2015, 07/03/2016, 08/24/2017, 05/29/2018  . Influenza-Unspecified 05/27/2020  . PFIZER(Purple Top)SARS-COV-2 Vaccination 10/15/2019, 11/05/2019, 06/17/2020  . Pneumococcal Conjugate-13 09/06/2020  . Pneumococcal Polysaccharide-23 12/08/2011  . Td 08/03/2009  . Tdap 06/04/2019  . Zoster 01/05/2016  . Zoster Recombinat (Shingrix) 05/29/2018    TDAP status: Up to date  Flu Vaccine status: Up to date  Pneumococcal vaccine status: Up to date  Covid-19 vaccine status: Completed vaccines  Qualifies for Shingles Vaccine? Yes   Zostavax completed Yes   Shingrix Completed?: Yes  Screening Tests Health Maintenance  Topic Date Due  . MAMMOGRAM  08/03/2021  . PNA vac Low Risk Adult (2 of 2 - PPSV23) 09/06/2021  . COLONOSCOPY (Pts 45-59yr Insurance coverage will need to be confirmed)  01/22/2028  . TETANUS/TDAP  06/03/2029  . INFLUENZA VACCINE  Completed  . DEXA SCAN  Completed  . COVID-19  Vaccine  Completed  . Hepatitis C Screening  Completed  . HPV  VACCINES  Aged Out    Health Maintenance  There are no preventive care reminders to display for this patient.  Colorectal cancer screening: Type of screening: Colonoscopy. Completed 01/21/2018. Repeat every 10 years  Mammogram status: Completed 08/03/2020. Repeat every year  Bone Density status: Completed 11/15/2015. Results reflect: Bone density results: OSTEOPENIA. Repeat every 5 years.  Lung Cancer Screening: (Low Dose CT Chest recommended if Age 19-80 years, 30 pack-year currently smoking OR have quit w/in 15years.) does not qualify.   Lung Cancer Screening Referral: N/A   Additional Screening:  Hepatitis C Screening: does qualify; Completed 09/02/2015  Vision Screening: Recommended annual ophthalmology exams for early detection of glaucoma and other disorders of the eye. Is the patient up to date with their annual eye exam?  Yes  Who is the provider or what is the name of the office in which the patient attends annual eye exams? [place] If pt is not established with a provider, would they like to be referred to a provider to establish care? No .   Dental Screening: Recommended annual dental exams for proper oral hygiene  Community Resource Referral / Chronic Care Management: CRR required this visit?  No   CCM required this visit?  No      Plan:     I have personally reviewed and noted the following in the patient's chart:   . Medical and social history . Use of alcohol, tobacco or illicit drugs  . Current medications and supplements . Functional ability and status . Nutritional status . Physical activity . Advanced directives . List of other physicians . Hospitalizations, surgeries, and ER visits in previous 12 months . Vitals . Screenings to include cognitive, depression, and falls . Referrals and appointments  In addition, I have reviewed and discussed with patient certain  preventive protocols, quality metrics, and best practice recommendations. A written personalized care plan for preventive services as well as general preventive health recommendations were provided to patient.     Ofilia Neas, LPN   [id number]   Nurse Notes: None

## 2020-12-02 ENCOUNTER — Ambulatory Visit (INDEPENDENT_AMBULATORY_CARE_PROVIDER_SITE_OTHER): Payer: Medicare Other

## 2020-12-02 ENCOUNTER — Ambulatory Visit: Payer: Medicare Other

## 2020-12-02 DIAGNOSIS — Z780 Asymptomatic menopausal state: Secondary | ICD-10-CM

## 2020-12-02 DIAGNOSIS — Z0000 Encounter for general adult medical examination without abnormal findings: Secondary | ICD-10-CM | POA: Diagnosis not present

## 2020-12-02 NOTE — Patient Instructions (Signed)
Bridget Flynn , Thank you for taking time to come for your Medicare Wellness Visit. I appreciate your ongoing commitment to your health goals. Please review the following plan we discussed and let me know if I can assist you in the future.   Screening recommendations/referrals: Colonoscopy: Up to date, next due 01/22/2028 Mammogram: Up to date, next due 08/03/2021 Bone Density: Currently due, you may contact the breast center at any time to get scheduled  Recommended yearly ophthalmology/optometry visit for glaucoma screening and checkup Recommended yearly dental visit for hygiene and checkup  Vaccinations: Influenza vaccine: Up to date, next due fall 2022  Pneumococcal vaccine: completed series  Tdap vaccine: Up to date, next due 06/03/2029 Shingles vaccine: Completed series     Advanced directives: Please bring copies of your advanced medical directives to our office so that we may get them scanned into your chart.   Conditions/risks identified: None   Next appointment: None    Preventive Care 65 Years and Older, Female Preventive care refers to lifestyle choices and visits with your health care provider that can promote health and wellness. What does preventive care include?  A yearly physical exam. This is also called an annual well check.  Dental exams once or twice a year.  Routine eye exams. Ask your health care provider how often you should have your eyes checked.  Personal lifestyle choices, including:  Daily care of your teeth and gums.  Regular physical activity.  Eating a healthy diet.  Avoiding tobacco and drug use.  Limiting alcohol use.  Practicing safe sex.  Taking low-dose aspirin every day.  Taking vitamin and mineral supplements as recommended by your health care provider. What happens during an annual well check? The services and screenings done by your health care provider during your annual well check will depend on your age, overall health,  lifestyle risk factors, and family history of disease. Counseling  Your health care provider may ask you questions about your:  Alcohol use.  Tobacco use.  Drug use.  Emotional well-being.  Home and relationship well-being.  Sexual activity.  Eating habits.  History of falls.  Memory and ability to understand (cognition).  Work and work Astronomer.  Reproductive health. Screening  You may have the following tests or measurements:  Height, weight, and BMI.  Blood pressure.  Lipid and cholesterol levels. These may be checked every 5 years, or more frequently if you are over 90 years old.  Skin check.  Lung cancer screening. You may have this screening every year starting at age 82 if you have a 30-pack-year history of smoking and currently smoke or have quit within the past 15 years.  Fecal occult blood test (FOBT) of the stool. You may have this test every year starting at age 54.  Flexible sigmoidoscopy or colonoscopy. You may have a sigmoidoscopy every 5 years or a colonoscopy every 10 years starting at age 24.  Hepatitis C blood test.  Hepatitis B blood test.  Sexually transmitted disease (STD) testing.  Diabetes screening. This is done by checking your blood sugar (glucose) after you have not eaten for a while (fasting). You may have this done every 1-3 years.  Bone density scan. This is done to screen for osteoporosis. You may have this done starting at age 60.  Mammogram. This may be done every 1-2 years. Talk to your health care provider about how often you should have regular mammograms. Talk with your health care provider about your test results, treatment options,  and if necessary, the need for more tests. Vaccines  Your health care provider may recommend certain vaccines, such as:  Influenza vaccine. This is recommended every year.  Tetanus, diphtheria, and acellular pertussis (Tdap, Td) vaccine. You may need a Td booster every 10 years.  Zoster  vaccine. You may need this after age 46.  Pneumococcal 13-valent conjugate (PCV13) vaccine. One dose is recommended after age 79.  Pneumococcal polysaccharide (PPSV23) vaccine. One dose is recommended after age 4. Talk to your health care provider about which screenings and vaccines you need and how often you need them. This information is not intended to replace advice given to you by your health care provider. Make sure you discuss any questions you have with your health care provider. Document Released: 10/08/2015 Document Revised: 05/31/2016 Document Reviewed: 07/13/2015 Elsevier Interactive Patient Education  2017 [place] Prevention in the Home Falls can cause injuries. They can happen to people of all ages. There are many things you can do to make your home safe and to help prevent falls. What can I do on the outside of my home?  Regularly fix the edges of walkways and driveways and fix any cracks.  Remove anything that might make you trip as you walk through a door, such as a raised step or threshold.  Trim any bushes or trees on the path to your home.  Use bright outdoor lighting.  Clear any walking paths of anything that might make someone trip, such as rocks or tools.  Regularly check to see if handrails are loose or broken. Make sure that both sides of any steps have handrails.  Any raised decks and porches should have guardrails on the edges.  Have any leaves, snow, or ice cleared regularly.  Use sand or salt on walking paths during winter.  Clean up any spills in your garage right away. This includes oil or grease spills. What can I do in the bathroom?  Use night lights.  Install grab bars by the toilet and in the tub and shower. Do not use towel bars as grab bars.  Use non-skid mats or decals in the tub or shower.  If you need to sit down in the shower, use a plastic, non-slip stool.  Keep the floor dry. Clean up any water that spills on the  floor as soon as it happens.  Remove soap buildup in the tub or shower regularly.  Attach bath mats securely with double-sided non-slip rug tape.  Do not have throw rugs and other things on the floor that can make you trip. What can I do in the bedroom?  Use night lights.  Make sure that you have a light by your bed that is easy to reach.  Do not use any sheets or blankets that are too big for your bed. They should not hang down onto the floor.  Have a firm chair that has side arms. You can use this for support while you get dressed.  Do not have throw rugs and other things on the floor that can make you trip. What can I do in the kitchen?  Clean up any spills right away.  Avoid walking on wet floors.  Keep items that you use a lot in easy-to-reach places.  If you need to reach something above you, use a strong step stool that has a grab bar.  Keep electrical cords out of the way.  Do not use floor polish or wax that makes floors slippery. If  you must use wax, use non-skid floor wax.  Do not have throw rugs and other things on the floor that can make you trip. What can I do with my stairs?  Do not leave any items on the stairs.  Make sure that there are handrails on both sides of the stairs and use them. Fix handrails that are broken or loose. Make sure that handrails are as long as the stairways.  Check any carpeting to make sure that it is firmly attached to the stairs. Fix any carpet that is loose or worn.  Avoid having throw rugs at the top or bottom of the stairs. If you do have throw rugs, attach them to the floor with carpet tape.  Make sure that you have a light switch at the top of the stairs and the bottom of the stairs. If you do not have them, ask someone to add them for you. What else can I do to help prevent falls?  Wear shoes that:  Do not have high heels.  Have rubber bottoms.  Are comfortable and fit you well.  Are closed at the toe. Do not wear  sandals.  If you use a stepladder:  Make sure that it is fully opened. Do not climb a closed stepladder.  Make sure that both sides of the stepladder are locked into place.  Ask someone to hold it for you, if possible.  Clearly mark and make sure that you can see:  Any grab bars or handrails.  First and last steps.  Where the edge of each step is.  Use tools that help you move around (mobility aids) if they are needed. These include:  Canes.  Walkers.  Scooters.  Crutches.  Turn on the lights when you go into a dark area. Replace any light bulbs as soon as they burn out.  Set up your furniture so you have a clear path. Avoid moving your furniture around.  If any of your floors are uneven, fix them.  If there are any pets around you, be aware of where they are.  Review your medicines with your doctor. Some medicines can make you feel dizzy. This can increase your chance of falling. Ask your doctor what other things that you can do to help prevent falls. This information is not intended to replace advice given to you by your health care provider. Make sure you discuss any questions you have with your health care provider. Document Released: 07/08/2009 Document Revised: 02/17/2016 Document Reviewed: 10/16/2014 Elsevier Interactive Patient Education  2017 [employer].

## 2020-12-20 ENCOUNTER — Ambulatory Visit (HOSPITAL_BASED_OUTPATIENT_CLINIC_OR_DEPARTMENT_OTHER)
Admission: RE | Admit: 2020-12-20 | Discharge: 2020-12-20 | Disposition: A | Discharge status: Home / Self Care | Payer: Medicare Other | Source: Ambulatory Visit | Attending: Obstetrics & Gynecology | Admitting: Obstetrics & Gynecology

## 2020-12-20 ENCOUNTER — Other Ambulatory Visit (HOSPITAL_COMMUNITY)
Admission: RE | Admit: 2020-12-20 | Discharge: 2020-12-20 | Disposition: A | Discharge status: Home / Self Care | Payer: Medicare Other | Source: Ambulatory Visit | Attending: Obstetrics & Gynecology | Admitting: Obstetrics & Gynecology

## 2020-12-20 ENCOUNTER — Encounter (HOSPITAL_BASED_OUTPATIENT_CLINIC_OR_DEPARTMENT_OTHER): Payer: Self-pay | Admitting: Obstetrics & Gynecology

## 2020-12-20 ENCOUNTER — Other Ambulatory Visit: Payer: Self-pay

## 2020-12-20 ENCOUNTER — Ambulatory Visit (INDEPENDENT_AMBULATORY_CARE_PROVIDER_SITE_OTHER): Payer: Medicare Other | Admitting: Obstetrics & Gynecology

## 2020-12-20 VITALS — BP 143/80 | HR 62 | Ht 62.5 in | Wt 143.0 lb

## 2020-12-20 DIAGNOSIS — Z124 Encounter for screening for malignant neoplasm of cervix: Secondary | ICD-10-CM

## 2020-12-20 DIAGNOSIS — Z8041 Family history of malignant neoplasm of ovary: Secondary | ICD-10-CM

## 2020-12-20 DIAGNOSIS — Z780 Asymptomatic menopausal state: Secondary | ICD-10-CM

## 2020-12-20 DIAGNOSIS — M8588 Other specified disorders of bone density and structure, other site: Secondary | ICD-10-CM | POA: Diagnosis present

## 2020-12-20 DIAGNOSIS — Z01419 Encounter for gynecological examination (general) (routine) without abnormal findings: Secondary | ICD-10-CM

## 2020-12-20 NOTE — Progress Notes (Signed)
65 y.o. [id number] Married White or Caucasian female here for breast and pelvic exam.  Denies vaginal bleeding.  Doing well.  She and her husband were helping with "homeschooling" of grandchildren during the pandemic.  They have 5 grandchildren.  Two are little.  She is helping keep them two days a week.  43 year old grandchild diagnosed with Rett Syndrome.  She is learning to communicate.   Pt has osteopenia.  Needs BMD this summer.  She does have this scheduled at the [facility] but this is not very convenient for her.  Would like to change locations to here.  Patient's last menstrual period was 10/26/2005.          Sexually active: Yes.    H/O STD:  no  Health Maintenance: PCP:  Dr. Elease Hashimoto.  Last wellness appt was 2021.  Did blood work at that appt:  04/2020 and 08/2020 Vaccines are up to date:  yes Colonoscopy:  12/2017, follow up 10 years MMG:  08/13/2020 BMD:  Scheduled for 04/01/2021 Last pap smear:  2019.   H/o abnormal pap smear:  no   reports that she has never smoked. She has never used smokeless tobacco. She reports previous alcohol use of about 1.0 standard drink of alcohol per week. She reports that she does not use drugs.  Past Medical History:  Diagnosis Date  . ALLERGIC RHINITIS 11/25/2007   Qualifier: Diagnosis of  By: Ronnald Ramp CNA/MA, Janett Billow    . Allergy   . Asthma   . HYPERLIPIDEMIA 07/12/2009   Qualifier: Diagnosis of  By: Valma Cava LPN, Izora Gala    . Osteopenia   . VITAMIN D DEFICIENCY 07/12/2009   Qualifier: Diagnosis of  By: Valma Cava LPN, Izora Gala      Past Surgical History:  Procedure Laterality Date  . COLONOSCOPY W/ POLYPECTOMY  2004  . DILATION AND CURETTAGE OF UTERUS    . LAPAROSCOPIC BILATERAL SALPINGECTOMY  08/24/08    Current Outpatient Medications  Medication Sig Dispense Refill  . albuterol (PROVENTIL HFA) 108 (90 Base) MCG/ACT inhaler Inhale 2 puffs into the lungs every 4 (four) hours as needed. For wheezing (Patient not taking: Reported on 12/02/2020) 2  each 1  . B Complex Vitamins (VITAMIN-B COMPLEX PO) Take by mouth daily. Takes only when fatigued.    . Chlorpheniramine Maleate (CHLOR-TABLETS PO) Take by mouth daily as needed.     . Cholecalciferol (VITAMIN D3) 1000 UNITS CAPS Take by mouth 3 (three) times a week. Takes once per week.    Marland Kitchen Fexofenadine HCl (MUCINEX ALLERGY PO) Take by mouth daily as needed.     . fluticasone (FLONASE) 50 MCG/ACT nasal spray Place into both nostrils daily.    . hydrochlorothiazide (HYDRODIURIL) 25 MG tablet Take 1 tablet (25 mg total) by mouth daily as needed. 90 tablet 3  . rosuvastatin (CRESTOR) 20 MG tablet Take 1 tablet (20 mg total) by mouth daily. 90 tablet 3   Current Facility-Administered Medications  Medication Dose Route Frequency Provider Last Rate Last Admin  . 0.9 %  sodium chloride infusion  500 mL Intravenous Once Nandigam, Venia Minks, MD        Family History  Problem Relation Age of Onset  . Diabetes Mother   . Cancer Mother        ovarian cancer  . Cirrhosis Mother   . Diabetes Father   . Cancer Father        Hodgkins lymphoma  . Diabetes Other   . Cancer Other  ovarian  . BRCA 1/2 Neg Hx   . Colon cancer Neg Hx   . Colon polyps Neg Hx   . Esophageal cancer Neg Hx   . Rectal cancer Neg Hx   . Stomach cancer Neg Hx   . Breast cancer Neg Hx     Review of Systems  All other systems reviewed and are negative.   Exam:   BP (!) 143/80   Pulse 62   Ht 5' 2.5" (1.588 m)   Wt 143 lb (64.9 kg)   LMP 10/26/2005   BMI 25.74 kg/m   Height: 5' 2.5" (158.8 cm)  General appearance: alert, cooperative and appears stated age Breasts: normal appearance, no masses or tenderness Abdomen: soft, non-tender; bowel sounds normal; no masses,  no organomegaly Lymph nodes: Cervical, supraclavicular, and axillary nodes normal.  No abnormal inguinal nodes palpated Neurologic: Grossly normal  Pelvic: External genitalia:  no lesions              Urethra:  normal appearing urethra with  no masses, tenderness or lesions              Bartholins and Skenes: normal                 Vagina: normal appearing vagina with normal color and discharge, no lesions              Cervix: no lesions              Pap taken: Yes.   Bimanual Exam:  Uterus:  normal size, contour, position, consistency, mobility, non-tender              Adnexa: normal adnexa and no mass, fullness, tenderness               Rectovaginal: Confirms               Anus:  normal sphincter tone, no lesions  Chaperone, Prince Rome, CMA, was present for exam.  Assessment/Plan: 1. Encntr for gyn exam (general) (routine) w/o abn findings - pap smear obtained today.  Shared decision making with pt occurred today regarding pap smears between 65 and 75.  She desires to continue for now. - MMG 08/13/2020 - colonoscopy 12/2017, follow up 10 years - BMD order placed - vaccines reviewed - lab work done with Dr. Elease Hashimoto  2. Postmenopausal - no HRT  3. Osteopenia of lumbar spine - DG BONE DENSITY (DXA); Future  4. Family history of ovarian cancer - s/p BSO 07/2008.  Has not done genetic testing.

## 2020-12-21 ENCOUNTER — Encounter (HOSPITAL_BASED_OUTPATIENT_CLINIC_OR_DEPARTMENT_OTHER): Payer: Self-pay

## 2020-12-21 LAB — CYTOLOGY - PAP: Diagnosis: NEGATIVE

## 2021-01-11 ENCOUNTER — Ambulatory Visit: Payer: Medicare Other | Attending: Internal Medicine

## 2021-01-11 ENCOUNTER — Other Ambulatory Visit (HOSPITAL_BASED_OUTPATIENT_CLINIC_OR_DEPARTMENT_OTHER): Payer: Self-pay

## 2021-01-11 ENCOUNTER — Other Ambulatory Visit: Payer: Self-pay

## 2021-01-11 DIAGNOSIS — Z23 Encounter for immunization: Secondary | ICD-10-CM

## 2021-01-11 MED ORDER — PFIZER-BIONT COVID-19 VAC-TRIS 30 MCG/0.3ML IM SUSP
INTRAMUSCULAR | 0 refills | Status: AC
  Filled 2021-01-11: qty 0.3, 1d supply, fill #0

## 2021-01-11 NOTE — Progress Notes (Signed)
   Covid-19 Vaccination Clinic  Name:  [name]    MRN: [id number] DOB: [birth year]  01/11/2021  [name] was observed post Covid-19 immunization for 15 minutes without incident. She was provided with Vaccine Information Sheet and instruction to access the V-Safe system.   [name] was instructed to call 911 with any severe reactions post vaccine: Marland Kitchen Difficulty breathing  . Swelling of face and throat  . A fast heartbeat  . A bad rash all over body  . Dizziness and weakness   Immunizations Administered    Name Date Dose VIS Date Route   PFIZER Comrnaty(Gray TOP) Covid-19 Vaccine 01/11/2021 10:01 AM 0.3 mL 09/02/2020 Intramuscular   Manufacturer: [employer], [employer]   Lot: [id number]   NDC: [phone]

## 2021-04-01 ENCOUNTER — Other Ambulatory Visit: Payer: Medicare Other

## 2021-04-08 ENCOUNTER — Telehealth (INDEPENDENT_AMBULATORY_CARE_PROVIDER_SITE_OTHER): Payer: Medicare Other | Admitting: Family Medicine

## 2021-04-08 ENCOUNTER — Other Ambulatory Visit: Payer: Self-pay

## 2021-04-08 DIAGNOSIS — J0191 Acute recurrent sinusitis, unspecified: Secondary | ICD-10-CM

## 2021-04-08 MED ORDER — AMOXICILLIN-POT CLAVULANATE 875-125 MG PO TABS: 1.0000 | ORAL_TABLET | Freq: Two times a day (BID) | ORAL | 0 refills | Status: DC

## 2021-04-08 NOTE — Progress Notes (Signed)
This visit type was conducted due to national recommendations for restrictions regarding the COVID-19 pandemic in an effort to limit this patient's exposure and mitigate transmission in our community.   Virtual Visit via Video Note  I connected with Bridget Flynn on 04/08/21 at 11:45 AM EDT by a video enabled telemedicine application and verified that I am speaking with the correct person using two identifiers.  Location patient: home Location provider:work or home office Persons participating in the virtual visit: patient, provider  I discussed the limitations of evaluation and management by telemedicine and the availability of in person appointments. The patient expressed understanding and agreed to proceed.   HPI:  Bridget Flynn called with recent positive COVID test.  She states that she and her husband actually tested positive for COVID back in June and had typical COVID type symptoms.  By July 4 she felt fully recovered.  She was with some other folks on Friday, July 8 but none of them had been sick.  One of the ladies she was around call that weekend stating that she had some nasal congestion.  Husna noted increased nasal congestion Sunday morning.  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  She went to urgent care and reportedly had PCR that came back positive.  She feels that her symptoms currently are much different than when she had COVID just a month ago.  She has some headaches especially frontal area and has some cheek pain.  Bloody and greenish nasal discharge.  Is also noticed some change in her breath and these are classic symptoms that she has had in the past with acute sinusitis.  She has allergy to sulfa.  She has done well with Augmentin in the past.  No dyspnea.  No significant cough.   ROS: See pertinent positives and negatives per HPI.  Past Medical History:  Diagnosis Date   ALLERGIC RHINITIS 11/25/2007    Qualifier: Diagnosis of  By: Ronnald Ramp CNA/MA, Jessica     Allergy    Asthma    HYPERLIPIDEMIA 07/12/2009   Qualifier: Diagnosis of  By: Valma Cava LPN, Vilinda Boehringer    VITAMIN D DEFICIENCY 07/12/2009   Qualifier: Diagnosis of  By: Valma Cava LPN, Izora Gala      Past Surgical History:  Procedure Laterality Date   COLONOSCOPY W/ POLYPECTOMY  2004   DILATION AND CURETTAGE OF UTERUS     LAPAROSCOPIC BILATERAL SALPINGECTOMY  08/24/08    Family History  Problem Relation Age of Onset   Diabetes Mother    Cancer Mother        ovarian cancer   Cirrhosis Mother    Diabetes Father    Cancer Father        Hodgkins lymphoma   Diabetes Other    Cancer Other        ovarian   BRCA 1/2 Neg Hx    Colon cancer Neg Hx    Colon polyps Neg Hx    Esophageal cancer Neg Hx    Rectal cancer Neg Hx    Stomach cancer Neg Hx    Breast cancer Neg Hx     SOCIAL HX: Non-smoker   Current Outpatient Medications:    amoxicillin-clavulanate (AUGMENTIN) 875-125 MG tablet, Take 1 tablet by mouth 2 (two) times daily., Disp: 20 tablet, Rfl: 0   B Complex Vitamins (VITAMIN-B COMPLEX PO), Take by mouth daily. Takes only when fatigued., Disp: ,  Rfl:    Chlorpheniramine Maleate (CHLOR-TABLETS PO), Take by mouth daily as needed. , Disp: , Rfl:    Cholecalciferol (VITAMIN D3) 1000 UNITS CAPS, Take by mouth 3 (three) times a week. Takes once per week., Disp: , Rfl:    COVID-19 mRNA Vac-TriS, Pfizer, (PFIZER-BIONT COVID-19 VAC-TRIS) SUSP injection, Inject into the muscle., Disp: 0.3 mL, Rfl: 0   Fexofenadine HCl (MUCINEX ALLERGY PO), Take by mouth daily as needed. , Disp: , Rfl:    fluticasone (FLONASE) 50 MCG/ACT nasal spray, Place into both nostrils daily., Disp: , Rfl:    hydrochlorothiazide (HYDRODIURIL) 25 MG tablet, Take 1 tablet (25 mg total) by mouth daily as needed., Disp: 90 tablet, Rfl: 3   rosuvastatin (CRESTOR) 20 MG tablet, Take 1 tablet (20 mg total) by mouth daily., Disp: 90 tablet, Rfl: 3  Current  Facility-Administered Medications:    0.9 %  sodium chloride infusion, 500 mL, Intravenous, Once, Nandigam, Venia Minks, MD  EXAM:  VITALS per patient if applicable:  GENERAL: alert, oriented, appears well and in no acute distress  HEENT: atraumatic, conjunttiva clear, no obvious abnormalities on inspection of external nose and ears  NECK: normal movements of the head and neck  LUNGS: on inspection no signs of respiratory distress, breathing rate appears normal, no obvious gross SOB, gasping or wheezing  CV: no obvious cyanosis  MS: moves all visible extremities without noticeable abnormality  PSYCH/NEURO: pleasant and cooperative, no obvious depression or anxiety, speech and thought processing grossly intact  ASSESSMENT AND PLAN:  Discussed the following assessment and plan:  Acute sinusitis.  Patient did have recent positive PCR test for COVID but questions whether this is still persisting from infection back in June.  -Cover with Augmentin 875 mg twice daily with food for 10 days -Follow-up for any persistent or worsening symptoms.     I discussed the assessment and treatment plan with the patient. The patient was provided an opportunity to ask questions and all were answered. The patient agreed with the plan and demonstrated an understanding of the instructions.   The patient was advised to call back or seek an in-person evaluation if the symptoms worsen or if the condition fails to improve as anticipated.     Carolann Littler, MD

## 2021-05-25 ENCOUNTER — Ambulatory Visit (INDEPENDENT_AMBULATORY_CARE_PROVIDER_SITE_OTHER): Payer: Medicare Other | Admitting: Family Medicine

## 2021-05-25 ENCOUNTER — Other Ambulatory Visit: Payer: Self-pay

## 2021-05-25 VITALS — BP 140/70 | HR 60 | Temp 98.1°F | Wt 142.5 lb

## 2021-05-25 DIAGNOSIS — J029 Acute pharyngitis, unspecified: Secondary | ICD-10-CM

## 2021-05-25 NOTE — Progress Notes (Signed)
Established Patient Office Visit  Subjective:  Patient ID: Bridget Flynn, female    DOB: 27-Jun-1956  Age: 65 y.o. MRN: [id number]  CC:  Chief Complaint  Patient presents with   Sore Throat    X 3 days, scratch/sore throat, sinus drainage, neck is tender to the touch, pt thinks she may have had a sinus infection recently     HPI [employer] presents for sore throat with onset last weekend.  She states symptoms are slightly better today.  She does have history of some seasonal allergies and postnasal drainage.  Her soreness seems to be slightly worse on the right side.  She has not noted any adenopathy.  No fevers or chills.  No significant cough.  She has occasionally gotten up some clear mucus.  She has had sinusitis in the past quite often but denies any headaches or facial pains currently.  Her symptoms did improve some with Tylenol.  No known sick contacts.  Past Medical History:  Diagnosis Date   ALLERGIC RHINITIS 11/25/2007   Qualifier: Diagnosis of  By: Ronnald Ramp CNA/MA, Jessica     Allergy    Asthma    HYPERLIPIDEMIA 07/12/2009   Qualifier: Diagnosis of  By: Valma Cava LPN, Vilinda Boehringer    VITAMIN D DEFICIENCY 07/12/2009   Qualifier: Diagnosis of  By: Valma Cava LPN, Izora Gala      Past Surgical History:  Procedure Laterality Date   COLONOSCOPY W/ POLYPECTOMY  2004   DILATION AND CURETTAGE OF UTERUS     LAPAROSCOPIC BILATERAL SALPINGECTOMY  08/24/08    Family History  Problem Relation Age of Onset   Diabetes Mother    Cancer Mother        ovarian cancer   Cirrhosis Mother    Diabetes Father    Cancer Father        Hodgkins lymphoma   Diabetes Other    Cancer Other        ovarian   BRCA 1/2 Neg Hx    Colon cancer Neg Hx    Colon polyps Neg Hx    Esophageal cancer Neg Hx    Rectal cancer Neg Hx    Stomach cancer Neg Hx    Breast cancer Neg Hx     Social History   Socioeconomic History   Marital status: Married    Spouse name: Not on file   Number of  children: Not on file   Years of education: Not on file   Highest education level: Bachelor's degree (e.g., BA, AB, BS)  Occupational History   Not on file  Tobacco Use   Smoking status: Never   Smokeless tobacco: Never  Vaping Use   Vaping Use: Never used  Substance and Sexual Activity   Alcohol use: Not Currently    Alcohol/week: 1.0 standard drink    Types: 1 Standard drinks or equivalent per week    Comment: occ glass of wine   Drug use: No   Sexual activity: Yes    Partners: Male    Birth control/protection: Other-see comments    Comment: BSO  Other Topics Concern   Not on file  Social History Narrative   Not on file   Social Determinants of Health   Financial Resource Strain: Low Risk    Difficulty of Paying Living Expenses: Not hard at all  Food Insecurity: No Food Insecurity   Worried About Charity fundraiser in the Last Year: Never true   Ran Out  of Food in the Last Year: Never true  Transportation Needs: No Transportation Needs   Lack of Transportation (Medical): No   Lack of Transportation (Non-Medical): No  Physical Activity: Sufficiently Active   Days of Exercise per Week: 6 days   Minutes of Exercise per Session: 40 min  Stress: No Stress Concern Present   Feeling of Stress : Not at all  Social Connections: Moderately Isolated   Frequency of Communication with Friends and Family: More than three times a week   Frequency of Social Gatherings with Friends and Family: Three times a week   Attends Religious Services: Never   Active Member of Clubs or Organizations: No   Attends Archivist Meetings: Never   Marital Status: Married  Human resources officer Violence: Not At Risk   Fear of Current or Ex-Partner: No   Emotionally Abused: No   Physically Abused: No   Sexually Abused: No    Outpatient Medications Prior to Visit  Medication Sig Dispense Refill   B Complex Vitamins (VITAMIN-B COMPLEX PO) Take by mouth daily. Takes only when fatigued.      Chlorpheniramine Maleate (CHLOR-TABLETS PO) Take by mouth daily as needed.      Cholecalciferol (VITAMIN D3) 1000 UNITS CAPS Take by mouth 3 (three) times a week. Takes once per week.     COVID-19 mRNA Vac-TriS, Pfizer, (PFIZER-BIONT COVID-19 VAC-TRIS) SUSP injection Inject into the muscle. 0.3 mL 0   Fexofenadine HCl (MUCINEX ALLERGY PO) Take by mouth daily as needed.      fluticasone (FLONASE) 50 MCG/ACT nasal spray Place into both nostrils daily.     hydrochlorothiazide (HYDRODIURIL) 25 MG tablet Take 1 tablet (25 mg total) by mouth daily as needed. 90 tablet 3   rosuvastatin (CRESTOR) 20 MG tablet Take 1 tablet (20 mg total) by mouth daily. 90 tablet 3   amoxicillin-clavulanate (AUGMENTIN) 875-125 MG tablet Take 1 tablet by mouth 2 (two) times daily. 20 tablet 0   Facility-Administered Medications Prior to Visit  Medication Dose Route Frequency Provider Last Rate Last Admin   0.9 %  sodium chloride infusion  500 mL Intravenous Once Nandigam, Venia Minks, MD        Allergies  Allergen Reactions   Allegra [Fexofenadine Hcl]     Depression, cryingspells   Biaxin [Clarithromycin]     REACTION: diarrhea, vomiting   Erythromycin     REACTION: GI upset   Lipitor [Atorvastatin] Other (See Comments)    arthralgias   Sulfa Antibiotics     ROS Review of Systems  Constitutional:  Negative for chills and fever.  HENT:  Positive for congestion, postnasal drip and sore throat.   Respiratory:  Negative for cough.   Neurological:  Negative for headaches.  Hematological:  Negative for adenopathy.     Objective:    Physical Exam Vitals reviewed.  Constitutional:      Appearance: She is well-developed.  HENT:     Right Ear: Tympanic membrane normal.     Left Ear: Tympanic membrane normal.     Mouth/Throat:     Mouth: Mucous membranes are moist.     Pharynx: Oropharynx is clear. No oropharyngeal exudate or posterior oropharyngeal erythema.     Tonsils: No tonsillar exudate.   Cardiovascular:     Rate and Rhythm: Normal rate and regular rhythm.  Pulmonary:     Effort: Pulmonary effort is normal.     Breath sounds: Normal breath sounds.  Musculoskeletal:     Cervical back: Neck supple.  Lymphadenopathy:     Cervical: No cervical adenopathy.  Neurological:     Mental Status: She is alert.    BP 140/70 (BP Location: Left Arm, Patient Position: Sitting, Cuff Size: Normal)   Pulse 60   Temp 98.1 F (36.7 C) (Oral)   Wt 142 lb 8 oz (64.6 kg)   LMP 10/26/2005   SpO2 99%   BMI 25.65 kg/m  Wt Readings from Last 3 Encounters:  05/25/21 142 lb 8 oz (64.6 kg)  12/20/20 143 lb (64.9 kg)  09/06/20 136 lb 8 oz (61.9 kg)     Health Maintenance Due  Topic Date Due   Zoster Vaccines- Shingrix (2 of 2) 07/24/2018   COVID-19 Vaccine (5 - Booster for Pfizer series) 05/13/2021   INFLUENZA VACCINE  04/25/2021    There are no preventive care reminders to display for this patient.  Lab Results  Component Value Date   TSH 2.97 06/04/2019   Lab Results  Component Value Date   WBC 7.2 06/04/2019   HGB 13.7 06/04/2019   HCT 40.0 06/04/2019   MCV 85.5 06/04/2019   PLT 315.0 06/04/2019   Lab Results  Component Value Date   NA 141 09/06/2020   K 4.3 09/06/2020   CO2 34 (H) 09/06/2020   GLUCOSE 101 (H) 09/06/2020   BUN 13 09/06/2020   CREATININE 0.63 09/06/2020   BILITOT 0.7 09/06/2020   ALKPHOS 45 06/04/2019   AST 18 09/06/2020   ALT 22 09/06/2020   PROT 7.3 09/06/2020   ALBUMIN 4.6 06/04/2019   CALCIUM 10.4 09/06/2020   GFR 82.68 06/04/2019   Lab Results  Component Value Date   CHOL 142 09/06/2020   Lab Results  Component Value Date   HDL 59 09/06/2020   Lab Results  Component Value Date   LDLCALC 69 09/06/2020   Lab Results  Component Value Date   TRIG 60 09/06/2020   Lab Results  Component Value Date   CHOLHDL 2.4 09/06/2020   Lab Results  Component Value Date   HGBA1C 5.3 07/03/2016      Assessment & Plan:   Problem List  Items Addressed This Visit   None Visit Diagnoses     Sore throat    -  Primary     Suspect either viral etiology or possibly related to postnasal drip from allergies.  No exudate.  No adenopathy noted.  -Continue Tylenol for soreness.  Consider topical such as Chloraseptic spray. -Touch base of sore throat not resolving in the next week  No orders of the defined types were placed in this encounter.   Follow-up: No follow-ups on file.    Carolann Littler, MD

## 2021-07-20 ENCOUNTER — Other Ambulatory Visit: Payer: Self-pay | Admitting: Obstetrics & Gynecology

## 2021-07-20 DIAGNOSIS — Z1231 Encounter for screening mammogram for malignant neoplasm of breast: Secondary | ICD-10-CM

## 2021-08-12 ENCOUNTER — Other Ambulatory Visit: Payer: Self-pay | Admitting: Family Medicine

## 2021-08-22 ENCOUNTER — Other Ambulatory Visit: Payer: Self-pay | Admitting: Family Medicine

## 2021-08-25 ENCOUNTER — Other Ambulatory Visit: Payer: Self-pay

## 2021-08-25 ENCOUNTER — Ambulatory Visit
Admission: RE | Admit: 2021-08-25 | Discharge: 2021-08-25 | Disposition: A | Discharge status: Home / Self Care | Payer: Medicare Other | Source: Ambulatory Visit | Attending: Obstetrics & Gynecology | Admitting: Obstetrics & Gynecology

## 2021-08-25 DIAGNOSIS — Z1231 Encounter for screening mammogram for malignant neoplasm of breast: Secondary | ICD-10-CM

## 2021-09-14 ENCOUNTER — Ambulatory Visit (INDEPENDENT_AMBULATORY_CARE_PROVIDER_SITE_OTHER): Payer: Medicare Other | Admitting: Family Medicine

## 2021-09-14 VITALS — BP 124/70 | HR 63 | Temp 98.0°F | Wt 144.2 lb

## 2021-09-14 DIAGNOSIS — E785 Hyperlipidemia, unspecified: Secondary | ICD-10-CM

## 2021-09-14 DIAGNOSIS — I10 Essential (primary) hypertension: Secondary | ICD-10-CM

## 2021-09-14 LAB — BASIC METABOLIC PANEL
BUN: 13 mg/dL (ref 6–23)
CO2: 36 mEq/L — ABNORMAL HIGH (ref 19–32)
Calcium: 9.6 mg/dL (ref 8.4–10.5)
Chloride: 92 mEq/L — ABNORMAL LOW (ref 96–112)
Creatinine, Ser: 0.63 mg/dL (ref 0.40–1.20)
GFR: 92.05 mL/min (ref >60.00)
Glucose, Bld: 89 mg/dL (ref 70–99)
Potassium: 3.5 mEq/L (ref 3.5–5.1)
Sodium: 135 mEq/L (ref 135–145)

## 2021-09-14 LAB — HEPATIC FUNCTION PANEL
ALT: 20 U/L (ref 0–35)
AST: 19 U/L (ref 0–37)
Albumin: 4.3 g/dL (ref 3.5–5.2)
Alkaline Phosphatase: 37 U/L — ABNORMAL LOW (ref 39–117)
Bilirubin, Direct: 0.1 mg/dL (ref 0.0–0.3)
Total Bilirubin: 0.6 mg/dL (ref 0.2–1.2)
Total Protein: 7.2 g/dL (ref 6.0–8.3)

## 2021-09-14 LAB — LIPID PANEL
Cholesterol: 140 mg/dL (ref 0–200)
HDL: 56.3 mg/dL (ref >39.00)
LDL Cholesterol: 69 mg/dL (ref 0–99)
NonHDL: 83.48
Total CHOL/HDL Ratio: 2
Triglycerides: 72 mg/dL (ref 0.0–149.0)
VLDL: 14.4 mg/dL (ref 0.0–40.0)

## 2021-09-14 MED ORDER — HYDROCHLOROTHIAZIDE 25 MG PO TABS: 25.0000 mg | ORAL_TABLET | Freq: Every day | ORAL | 3 refills | Status: DC | PRN

## 2021-09-14 NOTE — Progress Notes (Signed)
Established Patient Office Visit  Subjective:  Patient ID: Bridget Flynn, female    DOB: 1956-07-16  Age: 65 y.o. MRN: [id number]  CC:  Chief Complaint  Patient presents with   Medication Refill    HPI Bridget Flynn presents for medical follow-up.  She needs refills of HCTZ.  Does have a history of hypertension and also feels like she has sodium sensitive and has benefited from the HCTZ in the past in terms of retention and edema issues.  Blood pressures have been very stable.  She does take over-the-counter potassium supplement.  Hyperlipidemia.  She is on low-dose Crestor.  Takes every other day and has tolerated that dose though she has had increased myalgias when taking daily.  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  Past Medical History:  Diagnosis Date   ALLERGIC RHINITIS 11/25/2007   Qualifier: Diagnosis of  By: Ronnald Ramp CNA/MA, Jessica     Allergy    Asthma    HYPERLIPIDEMIA 07/12/2009   Qualifier: Diagnosis of  By: Valma Cava LPN, Vilinda Boehringer    VITAMIN D DEFICIENCY 07/12/2009   Qualifier: Diagnosis of  By: Valma Cava LPN, Izora Gala      Past Surgical History:  Procedure Laterality Date   COLONOSCOPY W/ POLYPECTOMY  2004   DILATION AND CURETTAGE OF UTERUS     LAPAROSCOPIC BILATERAL SALPINGECTOMY  08/24/08    Family History  Problem Relation Age of Onset   Diabetes Mother    Cancer Mother        ovarian cancer   Cirrhosis Mother    Diabetes Father    Cancer Father        Hodgkins lymphoma   Diabetes Other    Cancer Other        ovarian   BRCA 1/2 Neg Hx    Colon cancer Neg Hx    Colon polyps Neg Hx    Esophageal cancer Neg Hx    Rectal cancer Neg Hx    Stomach cancer Neg Hx    Breast cancer Neg Hx     Social History   Socioeconomic History   Marital status: Married    Spouse name: Not on file   Number of children: Not on file   Years of education: Not on file   Highest education level: Bachelor's degree  (e.g., BA, AB, BS)  Occupational History   Not on file  Tobacco Use   Smoking status: Never   Smokeless tobacco: Never  Vaping Use   Vaping Use: Never used  Substance and Sexual Activity   Alcohol use: Not Currently    Alcohol/week: 1.0 standard drink    Types: 1 Standard drinks or equivalent per week    Comment: occ glass of wine   Drug use: No   Sexual activity: Yes    Partners: Male    Birth control/protection: Other-see comments    Comment: BSO  Other Topics Concern   Not on file  Social History Narrative   Not on file   Social Determinants of Health   Financial Resource Strain: Low Risk    Difficulty of Paying Living Expenses: Not hard at all  Food Insecurity: No Food Insecurity   Worried About Charity fundraiser in the Last Year: Never true   Ran Out of Food in the Last Year: Never true  Transportation Needs: No Transportation Needs   Lack of Transportation (Medical): No   Lack of Transportation (  Non-Medical): No  Physical Activity: Sufficiently Active   Days of Exercise per Week: 6 days   Minutes of Exercise per Session: 40 min  Stress: No Stress Concern Present   Feeling of Stress : Not at all  Social Connections: Moderately Isolated   Frequency of Communication with Friends and Family: More than three times a week   Frequency of Social Gatherings with Friends and Family: Three times a week   Attends Religious Services: Never   Active Member of Clubs or Organizations: No   Attends Archivist Meetings: Never   Marital Status: Married  Human resources officer Violence: Not At Risk   Fear of Current or Ex-Partner: No   Emotionally Abused: No   Physically Abused: No   Sexually Abused: No    Outpatient Medications Prior to Visit  Medication Sig Dispense Refill   B Complex Vitamins (VITAMIN-B COMPLEX PO) Take by mouth daily. Takes only when fatigued.     Chlorpheniramine Maleate (CHLOR-TABLETS PO) Take by mouth daily as needed.      Cholecalciferol  (VITAMIN D3) 1000 UNITS CAPS Take by mouth 3 (three) times a week. Takes once per week.     COVID-19 mRNA Vac-TriS, Pfizer, (PFIZER-BIONT COVID-19 VAC-TRIS) SUSP injection Inject into the muscle. 0.3 mL 0   Fexofenadine HCl (MUCINEX ALLERGY PO) Take by mouth daily as needed.      fluticasone (FLONASE) 50 MCG/ACT nasal spray Place into both nostrils daily.     rosuvastatin (CRESTOR) 20 MG tablet Take 1 tablet (20 mg total) by mouth daily. 90 tablet 3   hydrochlorothiazide (HYDRODIURIL) 25 MG tablet Take 1 tablet (25 mg total) by mouth daily as needed. 90 tablet 3   Facility-Administered Medications Prior to Visit  Medication Dose Route Frequency Provider Last Rate Last Admin   0.9 %  sodium chloride infusion  500 mL Intravenous Once Nandigam, Venia Minks, MD        Allergies  Allergen Reactions   Allegra [Fexofenadine Hcl]     Depression, cryingspells   Biaxin [Clarithromycin]     REACTION: diarrhea, vomiting   Erythromycin     REACTION: GI upset   Lipitor [Atorvastatin] Other (See Comments)    arthralgias   Sulfa Antibiotics     ROS Review of Systems  Constitutional:  Negative for fatigue and unexpected weight change.  Eyes:  Negative for visual disturbance.  Respiratory:  Negative for cough, chest tightness, shortness of breath and wheezing.   Cardiovascular:  Negative for chest pain, palpitations and leg swelling.  Neurological:  Negative for dizziness, seizures, syncope, weakness, light-headedness and headaches.     Objective:    Physical Exam Constitutional:      Appearance: She is well-developed.  Eyes:     Pupils: Pupils are equal, round, and reactive to light.  Neck:     Thyroid: No thyromegaly.     Vascular: No JVD.  Cardiovascular:     Rate and Rhythm: Normal rate and regular rhythm.     Heart sounds:    No gallop.  Pulmonary:     Effort: Pulmonary effort is normal. No respiratory distress.     Breath sounds: Normal breath sounds. No wheezing or rales.   Musculoskeletal:     Cervical back: Neck supple.     Right lower leg: No edema.     Left lower leg: No edema.  Neurological:     Mental Status: She is alert.    BP 124/70 (BP Location: Left Arm, Patient Position: Sitting, Cuff  Size: Normal)    Pulse 63    Temp 98 F (36.7 C) (Oral)    Wt 144 lb 3.2 oz (65.4 kg)    LMP 10/26/2005    SpO2 98%    BMI 25.95 kg/m  Wt Readings from Last 3 Encounters:  09/14/21 144 lb 3.2 oz (65.4 kg)  05/25/21 142 lb 8 oz (64.6 kg)  12/20/20 143 lb (64.9 kg)     Health Maintenance Due  Topic Date Due   Zoster Vaccines- Shingrix (2 of 2) 07/24/2018   Pneumonia Vaccine 23+ Years old (3 - PPSV23 if available, else PCV20) 09/06/2021    There are no preventive care reminders to display for this patient.  Lab Results  Component Value Date   TSH 2.97 06/04/2019   Lab Results  Component Value Date   WBC 7.2 06/04/2019   HGB 13.7 06/04/2019   HCT 40.0 06/04/2019   MCV 85.5 06/04/2019   PLT 315.0 06/04/2019   Lab Results  Component Value Date   NA 141 09/06/2020   K 4.3 09/06/2020   CO2 34 (H) 09/06/2020   GLUCOSE 101 (H) 09/06/2020   BUN 13 09/06/2020   CREATININE 0.63 09/06/2020   BILITOT 0.7 09/06/2020   ALKPHOS 45 06/04/2019   AST 18 09/06/2020   ALT 22 09/06/2020   PROT 7.3 09/06/2020   ALBUMIN 4.6 06/04/2019   CALCIUM 10.4 09/06/2020   GFR 82.68 06/04/2019   Lab Results  Component Value Date   CHOL 142 09/06/2020   Lab Results  Component Value Date   HDL 59 09/06/2020   Lab Results  Component Value Date   LDLCALC 69 09/06/2020   Lab Results  Component Value Date   TRIG 60 09/06/2020   Lab Results  Component Value Date   CHOLHDL 2.4 09/06/2020   Lab Results  Component Value Date   HGBA1C 5.3 07/03/2016      Assessment & Plan:   #1 hypertension stable on HCTZ.  Refill HCTZ for 1 year.  Check basic metabolic panel  #2 hyperlipidemia.  Patient on Crestor though she is only tolerated taking this every other  day Check lipid and hepatic panel Continue low saturated fat diet   Meds ordered this encounter  Medications   hydrochlorothiazide (HYDRODIURIL) 25 MG tablet    Sig: Take 1 tablet (25 mg total) by mouth daily as needed.    Dispense:  90 tablet    Refill:  3    Follow-up: No follow-ups on file.    Carolann Littler, MD

## 2021-09-14 NOTE — Progress Notes (Signed)
A 

## 2021-09-16 ENCOUNTER — Encounter: Payer: Self-pay | Admitting: Family Medicine

## 2021-09-16 ENCOUNTER — Ambulatory Visit (INDEPENDENT_AMBULATORY_CARE_PROVIDER_SITE_OTHER): Payer: Medicare Other | Admitting: Family Medicine

## 2021-09-16 VITALS — BP 116/78 | HR 68 | Temp 99.0°F | Wt 143.1 lb

## 2021-09-16 DIAGNOSIS — R300 Dysuria: Secondary | ICD-10-CM | POA: Diagnosis not present

## 2021-09-16 DIAGNOSIS — R319 Hematuria, unspecified: Secondary | ICD-10-CM | POA: Diagnosis not present

## 2021-09-16 DIAGNOSIS — N12 Tubulo-interstitial nephritis, not specified as acute or chronic: Secondary | ICD-10-CM

## 2021-09-16 LAB — POC URINALSYSI DIPSTICK (AUTOMATED)
Bilirubin, UA: NEGATIVE
Glucose, UA: NEGATIVE
Ketones, UA: NEGATIVE
Leukocytes, UA: NEGATIVE
Nitrite, UA: NEGATIVE
Protein, UA: POSITIVE — AB
Spec Grav, UA: 1.01 (ref 1.010–1.025)
Urobilinogen, UA: 0.2 E.U./dL
pH, UA: 6 (ref 5.0–8.0)

## 2021-09-16 MED ORDER — CEFTRIAXONE SODIUM 1 G IJ SOLR
1.0000 g | Freq: Once | INTRAMUSCULAR | Status: AC
  Administered 2021-09-16: 13:00:00 1 g via INTRAMUSCULAR

## 2021-09-16 MED ORDER — CIPROFLOXACIN HCL 500 MG PO TABS
500.0000 mg | ORAL_TABLET | Freq: Two times a day (BID) | ORAL | 0 refills | Status: DC
Start: 2021-09-16 — End: 2022-03-19

## 2021-09-16 NOTE — Progress Notes (Signed)
° °  Subjective:    Patient ID: Bridget Flynn, female    DOB: 16-Oct-1955, 65 y.o.   MRN: [id number]  HPI Here for 3 days o urgency to urinate with burning. Today she has also developed pain in the right flank and right lower back. No fever or nausea. She drinks lots of water.    Review of Systems  Constitutional: Negative.   Respiratory: Negative.    Cardiovascular: Negative.   Gastrointestinal: Negative.   Genitourinary:  Positive for dysuria, flank pain, frequency and urgency. Negative for hematuria.      Objective:   Physical Exam Constitutional:      Appearance: Normal appearance. She is not ill-appearing.  Cardiovascular:     Rate and Rhythm: Normal rate and regular rhythm.     Pulses: Normal pulses.     Heart sounds: Normal heart sounds.  Pulmonary:     Effort: Pulmonary effort is normal.     Breath sounds: Normal breath sounds.  Abdominal:     General: Abdomen is flat. Bowel sounds are normal. There is no distension.     Palpations: Abdomen is soft. There is no mass.     Tenderness: There is right CVA tenderness. There is no left CVA tenderness, guarding or rebound.     Hernia: No hernia is present.     Comments: Mildly tender in the RLQ and right flank   Neurological:     Mental Status: She is alert.          Assessment & Plan:  Early pyelonephritis. We will give her a shot of Rocephin and she will follow this with 7 days of Cipro. Culture the sample.  Gershon Crane, MD

## 2021-09-18 LAB — URINE CULTURE
MICRO NUMBER:: 12795594
SPECIMEN QUALITY:: ADEQUATE

## 2021-09-20 ENCOUNTER — Other Ambulatory Visit: Payer: Self-pay

## 2021-09-20 MED ORDER — NITROFURANTOIN MONOHYD MACRO 100 MG PO CAPS: 100.0000 mg | ORAL_CAPSULE | Freq: Two times a day (BID) | ORAL | 0 refills | Status: DC

## 2021-10-05 ENCOUNTER — Telehealth: Payer: Self-pay | Admitting: Family Medicine

## 2021-10-05 MED ORDER — CEPHALEXIN 500 MG PO CAPS: 500.0000 mg | ORAL_CAPSULE | Freq: Three times a day (TID) | ORAL | 0 refills | Status: DC

## 2021-10-05 NOTE — Telephone Encounter (Signed)
Patient called because she has finished her Macrobid on 01/03 and her symptoms had gone away, but they have returned. Patient noticed the urgency to urinate last night on 01/10, patient has been frequently urinating all morning. Patient wants to know if another round of macrobid should be sent in or should something else be done.  Routed to two teams beacu PCP is Dr.Burchette but she was seen by Dr.Fry for UTI     Good callback number is [phone]      Please advise

## 2021-10-05 NOTE — Telephone Encounter (Signed)
Rx sent in. Patient is aware.  

## 2021-11-24 ENCOUNTER — Telehealth: Payer: Self-pay | Admitting: Family Medicine

## 2021-11-24 NOTE — Telephone Encounter (Signed)
Tried calling patient to schedule Medicare Annual Wellness Visit (AWV) either virtually or in office.  ? ?Voicemail was to a business did not leave message sent my chart message ? ? ?Last AWV 12/02/20 ? please schedule at anytime with [facility] Nurse Health Advisor 1 or 2 ? ? ?This should be a 45 minute visit.  ?

## 2021-12-26 ENCOUNTER — Telehealth (HOSPITAL_BASED_OUTPATIENT_CLINIC_OR_DEPARTMENT_OTHER): Payer: Self-pay | Admitting: Obstetrics & Gynecology

## 2021-12-26 ENCOUNTER — Ambulatory Visit (HOSPITAL_BASED_OUTPATIENT_CLINIC_OR_DEPARTMENT_OTHER): Payer: Medicare Other | Admitting: Obstetrics & Gynecology

## 2021-12-26 NOTE — Telephone Encounter (Signed)
Called and spoke and she rescheduled. ?

## 2022-01-16 ENCOUNTER — Ambulatory Visit (INDEPENDENT_AMBULATORY_CARE_PROVIDER_SITE_OTHER): Payer: Medicare Other

## 2022-01-16 VITALS — Ht 63.0 in | Wt 138.0 lb

## 2022-01-16 DIAGNOSIS — Z0000 Encounter for general adult medical examination without abnormal findings: Secondary | ICD-10-CM

## 2022-01-16 NOTE — Patient Instructions (Signed)
Bridget Flynn , ?Thank you for taking time to come for your Medicare Wellness Visit. I appreciate your ongoing commitment to your health goals. Please review the following plan we discussed and let me know if I can assist you in the future.  ? ?Screening recommendations/referrals: ?Colonoscopy: completed 01/21/2018, due 01/22/2028 ?Mammogram: completed 08/25/2021, due 08/26/2022 ?Bone Density: completed 12/20/2020 ?Recommended yearly ophthalmology/optometry visit for glaucoma screening and checkup ?Recommended yearly dental visit for hygiene and checkup ? ?Vaccinations: ?Influenza vaccine: due 04/25/2022 ?Pneumococcal vaccine: completed 09/06/2020 ?Tdap vaccine: completed 06/04/2019, due 06/03/2029 ?Shingles vaccine: needs second dose   ?Covid-19: 06/22/2021, 01/11/2021, 06/17/2020, 11/05/2019, 10/15/2019 ? ?Advanced directives: Please bring a copy of your POA (Power of Attorney) and/or Living Will to your next appointment.  ? ?Conditions/risks identified: none ? ?Next appointment: Follow up in one year for your annual wellness visit  ? ? ?Preventive Care 18 Years and Older, Female ?Preventive care refers to lifestyle choices and visits with your health care provider that can promote health and wellness. ?What does preventive care include? ?A yearly physical exam. This is also called an annual well check. ?Dental exams once or twice a year. ?Routine eye exams. Ask your health care provider how often you should have your eyes checked. ?Personal lifestyle choices, including: ?Daily care of your teeth and gums. ?Regular physical activity. ?Eating a healthy diet. ?Avoiding tobacco and drug use. ?Limiting alcohol use. ?Practicing safe sex. ?Taking low-dose aspirin every day. ?Taking vitamin and mineral supplements as recommended by your health care provider. ?What happens during an annual well check? ?The services and screenings done by your health care provider during your annual well check will depend on your age, overall health, lifestyle  risk factors, and family history of disease. ?Counseling  ?Your health care provider may ask you questions about your: ?Alcohol use. ?Tobacco use. ?Drug use. ?Emotional well-being. ?Home and relationship well-being. ?Sexual activity. ?Eating habits. ?History of falls. ?Memory and ability to understand (cognition). ?Work and work Statistician. ?Reproductive health. ?Screening  ?You may have the following tests or measurements: ?Height, weight, and BMI. ?Blood pressure. ?Lipid and cholesterol levels. These may be checked every 5 years, or more frequently if you are over 56 years old. ?Skin check. ?Lung cancer screening. You may have this screening every year starting at age 48 if you have a 30-pack-year history of smoking and currently smoke or have quit within the past 15 years. ?Fecal occult blood test (FOBT) of the stool. You may have this test every year starting at age 47. ?Flexible sigmoidoscopy or colonoscopy. You may have a sigmoidoscopy every 5 years or a colonoscopy every 10 years starting at age 82. ?Hepatitis C blood test. ?Hepatitis B blood test. ?Sexually transmitted disease (STD) testing. ?Diabetes screening. This is done by checking your blood sugar (glucose) after you have not eaten for a while (fasting). You may have this done every 1-3 years. ?Bone density scan. This is done to screen for osteoporosis. You may have this done starting at age 34. ?Mammogram. This may be done every 1-2 years. Talk to your health care provider about how often you should have regular mammograms. ?Talk with your health care provider about your test results, treatment options, and if necessary, the need for more tests. ?Vaccines  ?Your health care provider may recommend certain vaccines, such as: ?Influenza vaccine. This is recommended every year. ?Tetanus, diphtheria, and acellular pertussis (Tdap, Td) vaccine. You may need a Td booster every 10 years. ?Zoster vaccine. You may need this after age  60. ?Pneumococcal  13-valent conjugate (PCV13) vaccine. One dose is recommended after age 49. ?Pneumococcal polysaccharide (PPSV23) vaccine. One dose is recommended after age 35. ?Talk to your health care provider about which screenings and vaccines you need and how often you need them. ?This information is not intended to replace advice given to you by your health care provider. Make sure you discuss any questions you have with your health care provider. ?Document Released: 10/08/2015 Document Revised: 05/31/2016 Document Reviewed: 07/13/2015 ?Elsevier Interactive Patient Education ? 2017 [place]. ? ?Fall Prevention in the Home ?Falls can cause injuries. They can happen to people of all ages. There are many things you can do to make your home safe and to help prevent falls. ?What can I do on the outside of my home? ?Regularly fix the edges of walkways and driveways and fix any cracks. ?Remove anything that might make you trip as you walk through a door, such as a raised step or threshold. ?Trim any bushes or trees on the path to your home. ?Use bright outdoor lighting. ?Clear any walking paths of anything that might make someone trip, such as rocks or tools. ?Regularly check to see if handrails are loose or broken. Make sure that both sides of any steps have handrails. ?Any raised decks and porches should have guardrails on the edges. ?Have any leaves, snow, or ice cleared regularly. ?Use sand or salt on walking paths during winter. ?Clean up any spills in your garage right away. This includes oil or grease spills. ?What can I do in the bathroom? ?Use night lights. ?Install grab bars by the toilet and in the tub and shower. Do not use towel bars as grab bars. ?Use non-skid mats or decals in the tub or shower. ?If you need to sit down in the shower, use a plastic, non-slip stool. ?Keep the floor dry. Clean up any water that spills on the floor as soon as it happens. ?Remove soap buildup in the tub or shower regularly. ?Attach  bath mats securely with double-sided non-slip rug tape. ?Do not have throw rugs and other things on the floor that can make you trip. ?What can I do in the bedroom? ?Use night lights. ?Make sure that you have a light by your bed that is easy to reach. ?Do not use any sheets or blankets that are too big for your bed. They should not hang down onto the floor. ?Have a firm chair that has side arms. You can use this for support while you get dressed. ?Do not have throw rugs and other things on the floor that can make you trip. ?What can I do in the kitchen? ?Clean up any spills right away. ?Avoid walking on wet floors. ?Keep items that you use a lot in easy-to-reach places. ?If you need to reach something above you, use a strong step stool that has a grab bar. ?Keep electrical cords out of the way. ?Do not use floor polish or wax that makes floors slippery. If you must use wax, use non-skid floor wax. ?Do not have throw rugs and other things on the floor that can make you trip. ?What can I do with my stairs? ?Do not leave any items on the stairs. ?Make sure that there are handrails on both sides of the stairs and use them. Fix handrails that are broken or loose. Make sure that handrails are as long as the stairways. ?Check any carpeting to make sure that it is firmly attached to the stairs. Fix  any carpet that is loose or worn. ?Avoid having throw rugs at the top or bottom of the stairs. If you do have throw rugs, attach them to the floor with carpet tape. ?Make sure that you have a light switch at the top of the stairs and the bottom of the stairs. If you do not have them, ask someone to add them for you. ?What else can I do to help prevent falls? ?Wear shoes that: ?Do not have high heels. ?Have rubber bottoms. ?Are comfortable and fit you well. ?Are closed at the toe. Do not wear sandals. ?If you use a stepladder: ?Make sure that it is fully opened. Do not climb a closed stepladder. ?Make sure that both sides of the  stepladder are locked into place. ?Ask someone to hold it for you, if possible. ?Clearly mark and make sure that you can see: ?Any grab bars or handrails. ?First and last steps. ?Where the edge of each step is.

## 2022-01-16 NOTE — Progress Notes (Signed)
?I connected with Lenise Herald today by telephone and verified that I am speaking with the correct person using two identifiers. ?Location patient: home ?Location provider: work ?Persons participating in the virtual visit: Briel Rihanna, Marseille LPN. ?  ?I discussed the limitations, risks, security and privacy concerns of performing an evaluation and management service by telephone and the availability of in person appointments. I also discussed with the patient that there may be a patient responsible charge related to this service. The patient expressed understanding and verbally consented to this telephonic visit.  ?  ?Interactive audio and video telecommunications were attempted between this provider and patient, however failed, due to patient having technical difficulties OR patient did not have access to video capability.  We continued and completed visit with audio only. ? ?  ? ?Vital signs may be patient reported or missing. ? ?Subjective:  ? Bridget Charlottie Flynn is a 66 y.o. female who presents for Medicare Annual (Subsequent) preventive examination. ? ?Review of Systems    ? ?Cardiac Risk Factors include: advanced age (>34mn, >>82women);dyslipidemia;hypertension ? ?   ?Objective:  ?  ?Today's Vitals  ? 01/16/22 1602  ?Weight: 138 lb (62.6 kg)  ?Height: 5' 3"  (1.6 m)  ? ?Body mass index is 24.45 kg/m?. ? ? ?  01/16/2022  ?  4:08 PM 12/02/2020  ?  8:37 AM 01/21/2018  ?  8:37 AM  ?Advanced Directives  ?Does Patient Have a Medical Advance Directive? Yes Yes Yes  ?Type of AParamed[place]of AFountain [place]Living will HMoody AFBLiving will Living will;Healthcare Power of Attorney  ?Does patient want to make changes to medical advance directive?  No - Patient declined No - Patient declined  ?Copy of [place]? No - copy requested No - copy requested No - copy requested  ? ? ?Current Medications (verified) ?Outpatient Encounter Medications as of 01/16/2022   ?Medication Sig  ? B Complex Vitamins (VITAMIN-B COMPLEX PO) Take by mouth daily. Takes only when fatigued.  ? Chlorpheniramine Maleate (CHLOR-TABLETS PO) Take by mouth daily as needed.   ? Cholecalciferol (VITAMIN D3) 1000 UNITS CAPS Take by mouth 3 (three) times a week. Takes once per week.  ? Fexofenadine HCl (MUCINEX ALLERGY PO) Take by mouth daily as needed.   ? fluticasone (FLONASE) 50 MCG/ACT nasal spray Place into both nostrils daily.  ? hydrochlorothiazide (HYDRODIURIL) 25 MG tablet Take 1 tablet (25 mg total) by mouth daily as needed.  ? rosuvastatin (CRESTOR) 20 MG tablet Take 1 tablet (20 mg total) by mouth daily.  ? cephALEXin (KEFLEX) 500 MG capsule Take 1 capsule (500 mg total) by mouth 3 (three) times daily.  ? ciprofloxacin (CIPRO) 500 MG tablet Take 1 tablet (500 mg total) by mouth 2 (two) times daily.  ? COVID-19 mRNA Vac-TriS, Pfizer, (PFIZER-BIONT COVID-19 VAC-TRIS) SUSP injection Inject into the muscle.  ? nitrofurantoin, macrocrystal-monohydrate, (MACROBID) 100 MG capsule Take 1 capsule (100 mg total) by mouth 2 (two) times daily.  ? ?Facility-Administered Encounter Medications as of 01/16/2022  ?Medication  ? 0.9 %  sodium chloride infusion  ? ? ?Allergies (verified) ?Allegra [fexofenadine hcl], Biaxin [clarithromycin], Erythromycin, Lipitor [atorvastatin], and Sulfa antibiotics  ? ?History: ?Past Medical History:  ?Diagnosis Date  ? ALLERGIC RHINITIS 11/25/2007  ? Qualifier: Diagnosis of  By: JRonnald RampCNA/MA, JJanett Billow   ? Allergy   ? Asthma   ? HYPERLIPIDEMIA 07/12/2009  ? Qualifier: Diagnosis of  By: NValma CavaLPN, NIzora Gala   ?  Osteopenia   ? VITAMIN D DEFICIENCY 07/12/2009  ? Qualifier: Diagnosis of  By: Valma Cava LPN, Izora Gala    ? ?Past Surgical History:  ?Procedure Laterality Date  ? COLONOSCOPY W/ POLYPECTOMY  2004  ? DILATION AND CURETTAGE OF UTERUS    ? LAPAROSCOPIC BILATERAL SALPINGECTOMY  08/24/08  ? ?Family History  ?Problem Relation Age of Onset  ? Diabetes Mother   ? Cancer Mother   ?      ovarian cancer  ? Cirrhosis Mother   ? Diabetes Father   ? Cancer Father   ?     Hodgkins lymphoma  ? Diabetes Other   ? Cancer Other   ?     ovarian  ? BRCA 1/2 Neg Hx   ? Colon cancer Neg Hx   ? Colon polyps Neg Hx   ? Esophageal cancer Neg Hx   ? Rectal cancer Neg Hx   ? Stomach cancer Neg Hx   ? Breast cancer Neg Hx   ? ?Social History  ? ?Socioeconomic History  ? Marital status: Married  ?  Spouse name: Not on file  ? Number of children: Not on file  ? Years of education: Not on file  ? Highest education level: Bachelor's degree (e.g., BA, AB, BS)  ?Occupational History  ? Not on file  ?Tobacco Use  ? Smoking status: Never  ? Smokeless tobacco: Never  ?Vaping Use  ? Vaping Use: Never used  ?Substance and Sexual Activity  ? Alcohol use: Not Currently  ?  Alcohol/week: 1.0 standard drink  ?  Types: 1 Standard drinks or equivalent per week  ?  Comment: once or twice a year  ? Drug use: No  ? Sexual activity: Yes  ?  Partners: Male  ?  Birth control/protection: Other-see comments  ?  Comment: BSO  ?Other Topics Concern  ? Not on file  ?Social History Narrative  ? Not on file  ? ?Social Determinants of Health  ? ?Financial Resource Strain: Low Risk   ? Difficulty of Paying Living Expenses: Not hard at all  ?Food Insecurity: No Food Insecurity  ? Worried About Charity fundraiser in the Last Year: Never true  ? Ran Out of Food in the Last Year: Never true  ?Transportation Needs: No Transportation Needs  ? Lack of Transportation (Medical): No  ? Lack of Transportation (Non-Medical): No  ?Physical Activity: Sufficiently Active  ? Days of Exercise per Week: 4 days  ? Minutes of Exercise per Session: 50 min  ?Stress: No Stress Concern Present  ? Feeling of Stress : Only a little  ?Social Connections: Moderately Isolated  ? Frequency of Communication with Friends and Family: More than three times a week  ? Frequency of Social Gatherings with Friends and Family: Three times a week  ? Attends Religious Services: Never  ?  Active Member of Clubs or Organizations: No  ? Attends Archivist Meetings: Not on file  ? Marital Status: Married  ? ? ?Tobacco Counseling ?Counseling given: Not Answered ? ? ?Clinical Intake: ? ?Pre-visit preparation completed: Yes ? ?Pain : No/denies pain ? ?  ? ?Nutritional Risks: None ?Diabetes: No ? ?How often do you need to have someone help you when you read instructions, pamphlets, or other written materials from your doctor or pharmacy?: 1 - Never ?What is the last grade level you completed in school?: college ? ?Diabetic? no ? ?Interpreter Needed?: No ? ?Information entered by :: NAllen LPN ? ? ?Activities of  Daily Living ? ?  01/16/2022  ?  4:11 PM 01/15/2022  ? 10:14 PM  ?In your present state of health, do you have any difficulty performing the following activities:  ?Hearing? 0 0  ?Vision? 0 0  ?Difficulty concentrating or making decisions? 0 0  ?Walking or climbing stairs? 0 0  ?Dressing or bathing? 0 0  ?Doing errands, shopping? 0 0  ?Preparing Food and eating ? N N  ?Using the Toilet? N N  ?In the past six months, have you accidently leaked urine? N N  ?Do you have problems with loss of bowel control? N N  ?Managing your Medications? N N  ?Managing your Finances? N N  ?Housekeeping or managing your Housekeeping? N N  ? ? ?Patient Care Team: ?Eulas Post, MD as PCP - General ? ?Indicate any recent Medical Services you may have received from other than [facility] providers in the past year (date may be approximate). ? ?   ?Assessment:  ? This is a routine wellness examination for Lizeth. ? ?Hearing/Vision screen ?Vision Screening - Comments:: Regular eye exams, Dr. Felton Clinton ? ?Dietary issues and exercise activities discussed: ?Current Exercise Habits: Home exercise routine, Type of exercise: walking, Time (Minutes): 50, Frequency (Times/Week): 4, Weekly Exercise (Minutes/Week): 200 ? ? Goals Addressed   ? ?  ?  ?  ?  ? This Visit's Progress  ?  Patient Stated     ?  01/16/2022, increase exercise ?   ? ?  ? ?Depression Screen ? ?  01/16/2022  ?  4:10 PM 09/16/2021  ? 12:49 PM 12/02/2020  ?  8:39 AM 09/06/2020  ?  9:05 AM 06/04/2019  ?  4:13 PM 07/24/2018  ?  4:43 PM  ?PHQ 2/9 Scores  ?PHQ - 2 Score 0 0 0 0 0 0  ?P

## 2022-03-16 ENCOUNTER — Ambulatory Visit (INDEPENDENT_AMBULATORY_CARE_PROVIDER_SITE_OTHER): Payer: Medicare Other | Admitting: Obstetrics & Gynecology

## 2022-03-16 VITALS — BP 127/84 | HR 76 | Ht 63.0 in | Wt 144.2 lb

## 2022-03-16 DIAGNOSIS — Z01419 Encounter for gynecological examination (general) (routine) without abnormal findings: Secondary | ICD-10-CM | POA: Diagnosis not present

## 2022-03-16 DIAGNOSIS — Z780 Asymptomatic menopausal state: Secondary | ICD-10-CM

## 2022-03-16 DIAGNOSIS — Z8041 Family history of malignant neoplasm of ovary: Secondary | ICD-10-CM | POA: Diagnosis not present

## 2022-03-16 DIAGNOSIS — M8588 Other specified disorders of bone density and structure, other site: Secondary | ICD-10-CM

## 2022-03-16 NOTE — Progress Notes (Unsigned)
66 y.o. [id number] Married White or Caucasian female here for breast and pelvic exam.  I am also following her for osteopenia.  Last was done 11/2020 and worse t score was -1.4.  Not on any treatment.    Denies vaginal bleeding.  Patient's last menstrual period was 10/26/2005.          Sexually active: Yes.    H/O STD:  no  Health Maintenance: PCP:  Dr. Caryl Never.  Last wellness appt was 12/2021.  Did blood work at that appt:  no.  Is going to have this scheduled. Vaccines are up to date:  needs the second shingrix vaccination Colonoscopy:  01/21/2018, follow up 10 years MMG:  08/25/2021 Negative BMD:  2022 Mild Osteopenia Last pap smear:  12/20/2020 Negative.   H/o abnormal pap smear:  no    reports that she has never smoked. She has never used smokeless tobacco. She reports that she does not currently use alcohol after a past usage of about 1.0 standard drink of alcohol per week. She reports that she does not use drugs.  Past Medical History:  Diagnosis Date   ALLERGIC RHINITIS 11/25/2007   Qualifier: Diagnosis of  By: Yetta Barre CNA/MA, Jessica     Allergy    Asthma    HYPERLIPIDEMIA 07/12/2009   Qualifier: Diagnosis of  By: Gabriel Rung LPN, Claudia Desanctis    VITAMIN D DEFICIENCY 07/12/2009   Qualifier: Diagnosis of  By: Gabriel Rung LPN, Harriett Sine      Past Surgical History:  Procedure Laterality Date   COLONOSCOPY W/ POLYPECTOMY  2004   DILATION AND CURETTAGE OF UTERUS     LAPAROSCOPIC BILATERAL SALPINGECTOMY  08/24/08    Current Outpatient Medications  Medication Sig Dispense Refill   B Complex Vitamins (VITAMIN-B COMPLEX PO) Take by mouth daily. Takes only when fatigued.     Chlorpheniramine Maleate (CHLOR-TABLETS PO) Take by mouth daily as needed.      Cholecalciferol (VITAMIN D3) 1000 UNITS CAPS Take by mouth 3 (three) times a week. Takes once per week.     ciprofloxacin (CIPRO) 500 MG tablet Take 1 tablet (500 mg total) by mouth 2 (two) times daily. 14 tablet 0   COVID-19 mRNA  Vac-TriS, Pfizer, (PFIZER-BIONT COVID-19 VAC-TRIS) SUSP injection Inject into the muscle. 0.3 mL 0   Fexofenadine HCl (MUCINEX ALLERGY PO) Take by mouth daily as needed.      fluticasone (FLONASE) 50 MCG/ACT nasal spray Place into both nostrils daily.     hydrochlorothiazide (HYDRODIURIL) 25 MG tablet Take 1 tablet (25 mg total) by mouth daily as needed. 90 tablet 3   rosuvastatin (CRESTOR) 20 MG tablet Take 1 tablet (20 mg total) by mouth daily. 90 tablet 3   Current Facility-Administered Medications  Medication Dose Route Frequency Provider Last Rate Last Admin   0.9 %  sodium chloride infusion  500 mL Intravenous Once Nandigam, Eleonore Chiquito, MD        Family History  Problem Relation Age of Onset   Diabetes Mother    Cancer Mother        ovarian cancer   Cirrhosis Mother    Diabetes Father    Cancer Father        Hodgkins lymphoma   Diabetes Other    Cancer Other        ovarian   BRCA 1/2 Neg Hx    Colon cancer Neg Hx    Colon polyps Neg Hx    Esophageal cancer Neg Hx  Rectal cancer Neg Hx    Stomach cancer Neg Hx    Breast cancer Neg Hx     Review of Systems  Exam:   LMP 10/26/2005      General appearance: alert, cooperative and appears stated age Breasts: {Exam; breast:13139::"normal appearance, no masses or tenderness"} Abdomen: soft, non-tender; bowel sounds normal; no masses,  no organomegaly Lymph nodes: Cervical, supraclavicular, and axillary nodes normal.  No abnormal inguinal nodes palpated Neurologic: Grossly normal  Pelvic: External genitalia:  no lesions              Urethra:  normal appearing urethra with no masses, tenderness or lesions              Bartholins and Skenes: normal                 Vagina: normal appearing vagina with atrophic changes ***and no discharge, no lesions              Cervix: {exam; cervix:14595}              Pap taken: {yes no:314532} Bimanual Exam:  Uterus:  {exam; uterus:12215}              Adnexa: {exam; adnexa:12223}                Rectovaginal: Confirms               Anus:  normal sphincter tone, no lesions  Chaperone, ***, CMA, was present for exam.  Assessment/Plan: There are no diagnoses linked to this encounter.

## 2022-03-19 ENCOUNTER — Encounter (HOSPITAL_BASED_OUTPATIENT_CLINIC_OR_DEPARTMENT_OTHER): Payer: Self-pay | Admitting: Obstetrics & Gynecology

## 2022-07-11 ENCOUNTER — Ambulatory Visit (INDEPENDENT_AMBULATORY_CARE_PROVIDER_SITE_OTHER): Payer: Medicare Other

## 2022-07-11 DIAGNOSIS — R350 Frequency of micturition: Secondary | ICD-10-CM

## 2022-07-11 LAB — POCT URINALYSIS DIPSTICK
Bilirubin, UA: NEGATIVE
Glucose, UA: NEGATIVE
Ketones, UA: NEGATIVE
Leukocytes, UA: NEGATIVE
Nitrite, UA: NEGATIVE
Protein, UA: NEGATIVE
Spec Grav, UA: 1.025 (ref 1.010–1.025)
Urobilinogen, UA: 0.2 E.U./dL
pH, UA: 5.5 (ref 5.0–8.0)

## 2022-07-11 NOTE — Progress Notes (Signed)
Patient came in today to give a urine sample to be evaluated. Patient states she is having a lot of different symptoms. She Is experiencing lower back pain, frequency of urine and possibly a prolapsed bladder. She is also complaining of pain with intercourse.   Urine was checked. Patient has made an appointment to be seen by Dr. Sabra Heck. tbw

## 2022-07-12 ENCOUNTER — Ambulatory Visit (INDEPENDENT_AMBULATORY_CARE_PROVIDER_SITE_OTHER): Payer: Medicare Other | Admitting: Obstetrics & Gynecology

## 2022-07-12 ENCOUNTER — Encounter (HOSPITAL_BASED_OUTPATIENT_CLINIC_OR_DEPARTMENT_OTHER): Payer: Self-pay | Admitting: Obstetrics & Gynecology

## 2022-07-12 VITALS — BP 127/67 | HR 63 | Ht 63.0 in | Wt 144.2 lb

## 2022-07-12 DIAGNOSIS — N368 Other specified disorders of urethra: Secondary | ICD-10-CM | POA: Diagnosis not present

## 2022-07-12 DIAGNOSIS — R3915 Urgency of urination: Secondary | ICD-10-CM

## 2022-07-12 DIAGNOSIS — N3000 Acute cystitis without hematuria: Secondary | ICD-10-CM | POA: Diagnosis not present

## 2022-07-12 MED ORDER — ESTRADIOL 0.1 MG/GM VA CREA: TOPICAL_CREAM | VAGINAL | 2 refills | Status: DC

## 2022-07-12 NOTE — Progress Notes (Signed)
GYNECOLOGY  VISIT  CC:   urinary frequency  HPI: 66 y.o. [id number] Married White or Caucasian female here for concerns about possible UTI.  Pt reports she has significant UTI  back in the December/January time frame.  Had urine culture that was positive for e coli.  Took about three different antibiotics to finally fix this.  She reports about a week ago, she noted some increased urinary frequency.  She has also had some pain with intercourse at insertion.  Denies vaginal bleeding.  Denies fever.     Past Medical History:  Diagnosis Date   ALLERGIC RHINITIS 11/25/2007   Qualifier: Diagnosis of  By: Ronnald Ramp CNA/MA, Jessica     Allergy    Asthma    HYPERLIPIDEMIA 07/12/2009   Qualifier: Diagnosis of  By: Valma Cava LPN, Vilinda Boehringer    VITAMIN D DEFICIENCY 07/12/2009   Qualifier: Diagnosis of  By: Valma Cava LPN, Izora Gala      MEDS:   Current Outpatient Medications on File Prior to Visit  Medication Sig Dispense Refill   B Complex Vitamins (VITAMIN-B COMPLEX PO) Take by mouth daily. Takes only when fatigued.     Chlorpheniramine Maleate (CHLOR-TABLETS PO) Take by mouth daily as needed.      Cholecalciferol (VITAMIN D3) 1000 UNITS CAPS Take by mouth 3 (three) times a week. Takes once per week.     COVID-19 mRNA Vac-TriS, Pfizer, (PFIZER-BIONT COVID-19 VAC-TRIS) SUSP injection Inject into the muscle. 0.3 mL 0   Fexofenadine HCl (MUCINEX ALLERGY PO) Take by mouth daily as needed.      fluticasone (FLONASE) 50 MCG/ACT nasal spray Place into both nostrils daily.     hydrochlorothiazide (HYDRODIURIL) 25 MG tablet Take 1 tablet (25 mg total) by mouth daily as needed. 90 tablet 3   rosuvastatin (CRESTOR) 20 MG tablet Take 1 tablet (20 mg total) by mouth daily. 90 tablet 3   Current Facility-Administered Medications on File Prior to Visit  Medication Dose Route Frequency Provider Last Rate Last Admin   0.9 %  sodium chloride infusion  500 mL Intravenous Once Nandigam, Venia Minks, MD         ALLERGIES: Kyung Rudd hcl], Biaxin [clarithromycin], Erythromycin, Lipitor [atorvastatin], and Sulfa antibiotics  SH:  married, non smoker  Review of Systems  Constitutional: Negative.   Genitourinary:  Positive for urgency.    PHYSICAL EXAMINATION:    BP 127/67 (BP Location: Left Arm, Patient Position: Sitting, Cuff Size: Large)   Pulse 63   Ht 5\' 3"  (1.6 m) Comment: Reported  Wt 144 lb 3.2 oz (65.4 kg)   LMP 10/26/2005   BMI 25.54 kg/m     General appearance: alert, cooperative and appears stated age Lymph:  no inguinal LAD noted  Pelvic: External genitalia:  no lesions              Urethra:  prolapse of urethra noted, no masses              Bartholins and Skenes: normal                 Vagina: normal appearing vagina with normal color and discharge, no lesions            Assessment/Plan: 1. Urinary urgency - will r/o infection with culture today - Urine Culture  2. Urethral prolapse - diagnosis and treatment discussed.  Feel reasonable to treat with estradiol cream - estradiol (ESTRACE) 0.1 MG/GM vaginal cream; 1 gram vaginally twice weekly and apply a  small amount of cream topically to urethra at the same time  Dispense: 42.5 g; Refill: 2

## 2022-07-16 LAB — URINE CULTURE

## 2022-07-17 ENCOUNTER — Other Ambulatory Visit (HOSPITAL_BASED_OUTPATIENT_CLINIC_OR_DEPARTMENT_OTHER): Payer: Self-pay | Admitting: *Deleted

## 2022-07-17 MED ORDER — AMOXICILLIN 500 MG PO CAPS
500.0000 mg | ORAL_CAPSULE | Freq: Three times a day (TID) | ORAL | 0 refills | Status: AC
Start: 2022-07-17 — End: 2022-07-24

## 2022-07-17 NOTE — Progress Notes (Signed)
Rx sent to pharmacy for treatment of positive urine culture 

## 2022-07-17 NOTE — Addendum Note (Signed)
Addended by: Megan Salon on: 07/17/2022 05:53 AM   Modules accepted: Orders

## 2022-07-24 ENCOUNTER — Other Ambulatory Visit: Payer: Self-pay | Admitting: Family Medicine

## 2022-08-02 ENCOUNTER — Ambulatory Visit (INDEPENDENT_AMBULATORY_CARE_PROVIDER_SITE_OTHER): Payer: Medicare Other

## 2022-08-02 DIAGNOSIS — R82998 Other abnormal findings in urine: Secondary | ICD-10-CM | POA: Diagnosis not present

## 2022-08-02 LAB — POCT URINALYSIS DIPSTICK
Bilirubin, UA: NEGATIVE
Glucose, UA: NEGATIVE
Ketones, UA: NEGATIVE
Nitrite, UA: NEGATIVE
Protein, UA: NEGATIVE
Spec Grav, UA: 1.015 (ref 1.010–1.025)
Urobilinogen, UA: 0.2 E.U./dL
pH, UA: 5 (ref 5.0–8.0)

## 2022-08-02 NOTE — Progress Notes (Signed)
Patient came in today for repeat urine culture. Urinalysis was performed and sent for culture. tbw

## 2022-08-03 ENCOUNTER — Other Ambulatory Visit (HOSPITAL_BASED_OUTPATIENT_CLINIC_OR_DEPARTMENT_OTHER): Payer: Self-pay

## 2022-08-03 MED ORDER — AMOXICILLIN-POT CLAVULANATE 500-125 MG PO TABS
1.0000 | ORAL_TABLET | Freq: Two times a day (BID) | ORAL | 0 refills | Status: DC
Start: 2022-08-03 — End: 2022-08-09

## 2022-08-03 NOTE — Progress Notes (Signed)
Called patient and [facility] for patient to give our office a call back concerning the medication. Patient came in yesterday to give a repeat urine sample. Based off the results and per Dr. Hyacinth Meeker, we have sent in a RX for Augmentin 500mg  BID for 7 days. tbw

## 2022-08-04 LAB — URINE CULTURE

## 2022-08-09 ENCOUNTER — Ambulatory Visit (INDEPENDENT_AMBULATORY_CARE_PROVIDER_SITE_OTHER): Payer: Medicare Other | Admitting: Family Medicine

## 2022-08-09 ENCOUNTER — Encounter: Payer: Self-pay | Admitting: Family Medicine

## 2022-08-09 VITALS — BP 140/80 | HR 65 | Temp 98.1°F | Ht 63.0 in | Wt 146.5 lb

## 2022-08-09 DIAGNOSIS — J324 Chronic pansinusitis: Secondary | ICD-10-CM

## 2022-08-09 IMAGING — MG MM DIGITAL SCREENING BILAT W/ TOMO AND CAD
8 series · 8 of 24 positions shown · non-contrast
Comparison: Previous exam(s).

CLINICAL DATA: Screening.

EXAM:
DIGITAL SCREENING BILATERAL MAMMOGRAM WITH TOMOSYNTHESIS AND CAD
TECHNIQUE: Bilateral screening digital craniocaudal and mediolateral oblique
mammograms were obtained. Bilateral screening digital breast
tomosynthesis was performed. The images were evaluated with
computer-aided detection.

[L CC synth-2D]
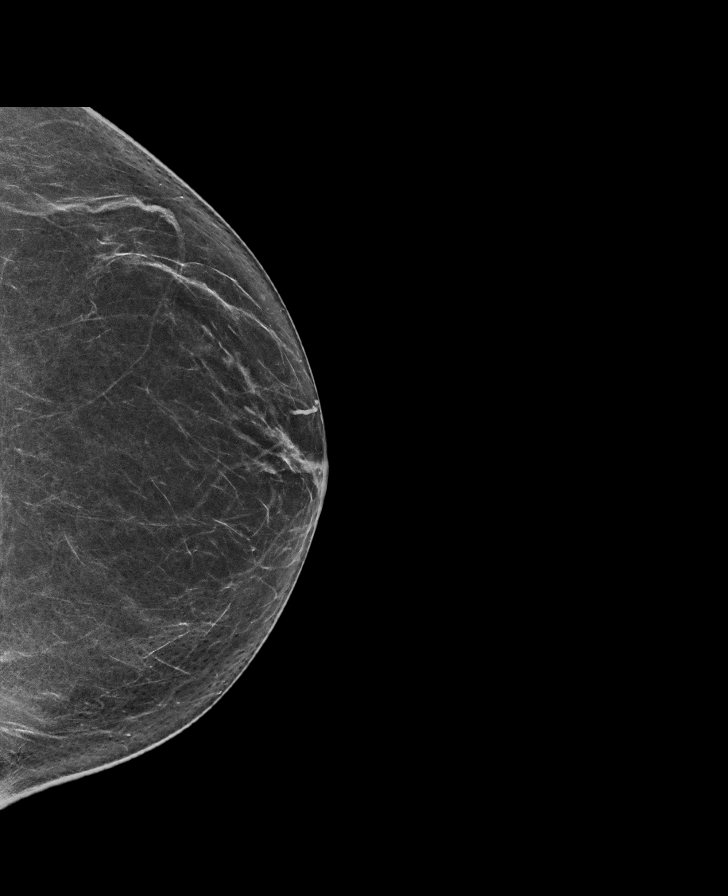

[R MLO synth-2D]
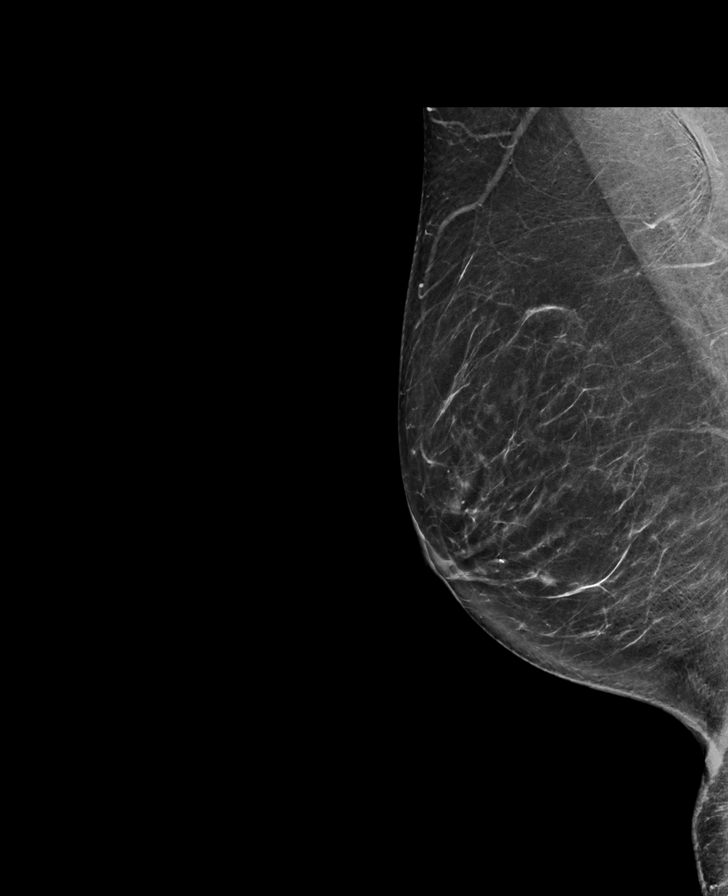

[R CC synth-2D]
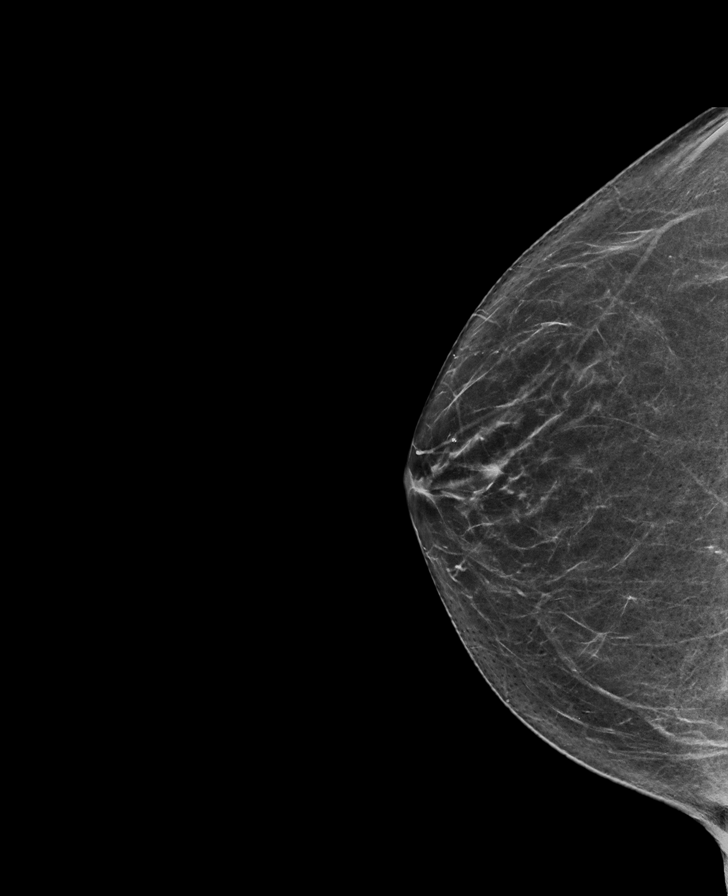

[L MLO synth-2D]
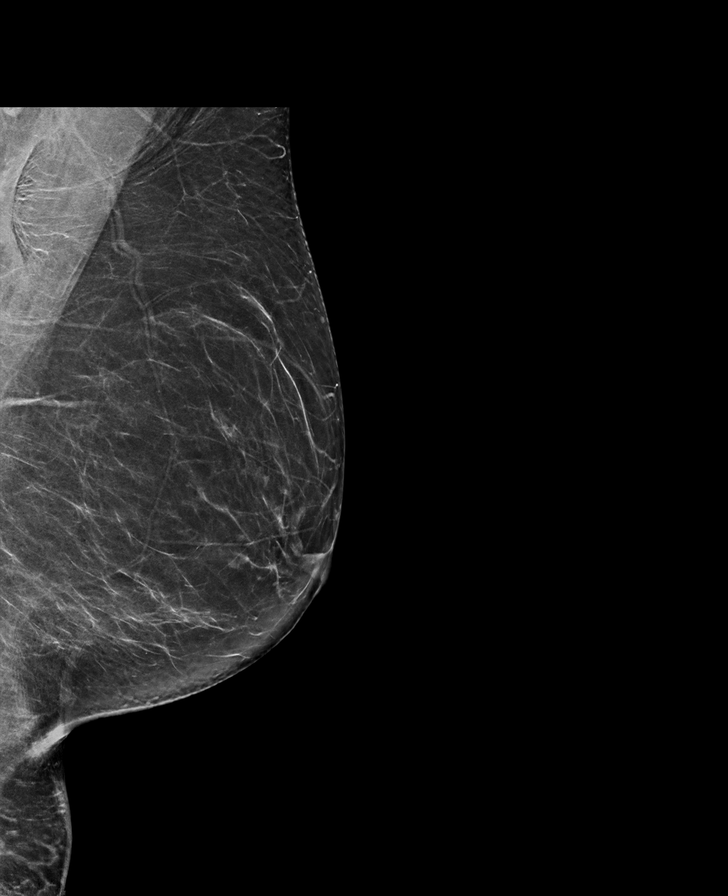

[R CC tomo · tomo slice 35/69.0]
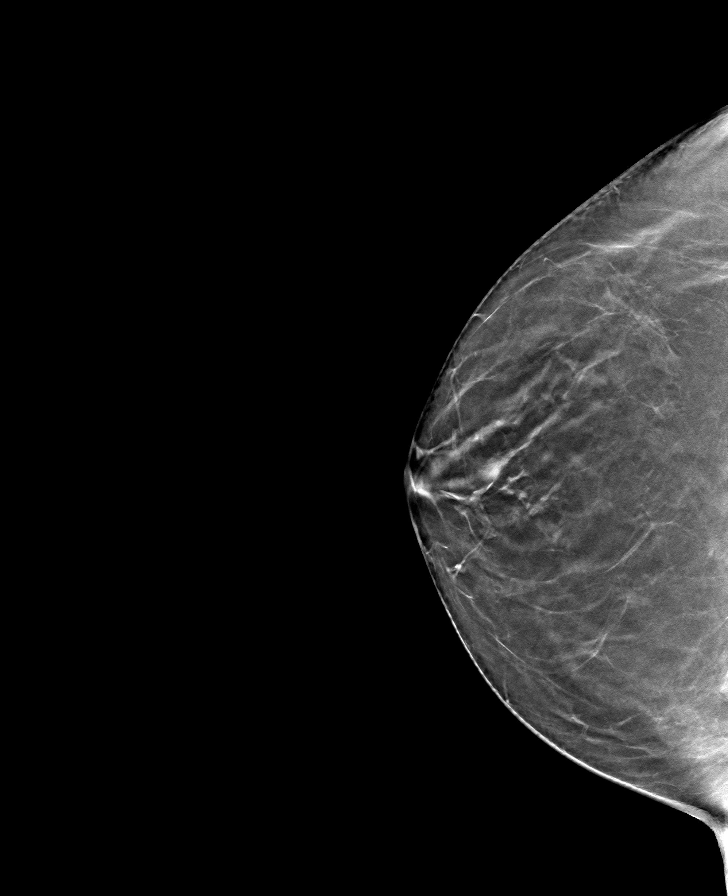

[L CC tomo · tomo slice 37/72.0]
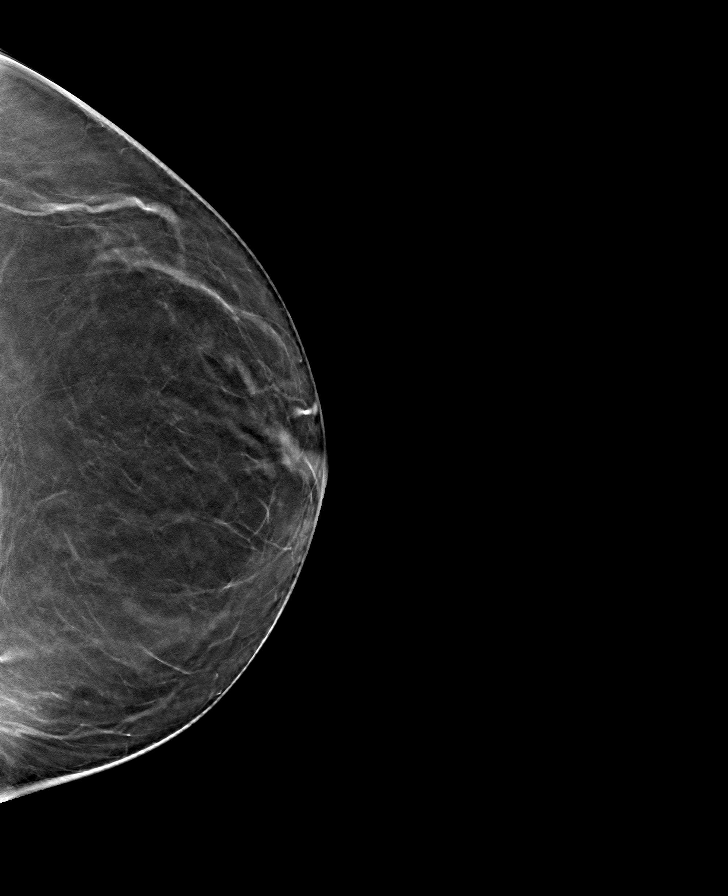

[L MLO tomo · tomo slice 37/74.0]
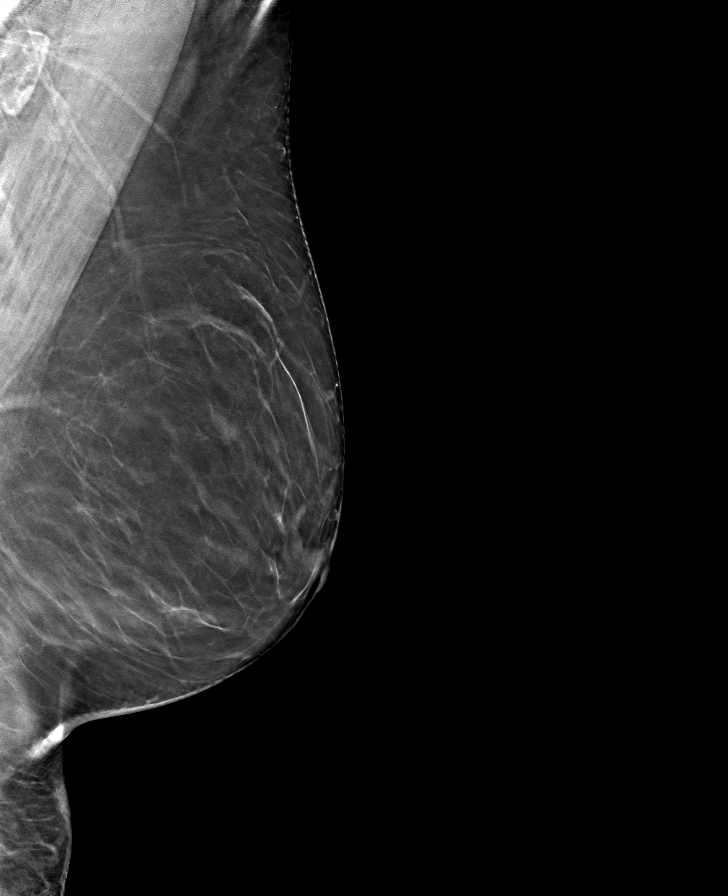

[R MLO tomo · tomo slice 37/73.0]
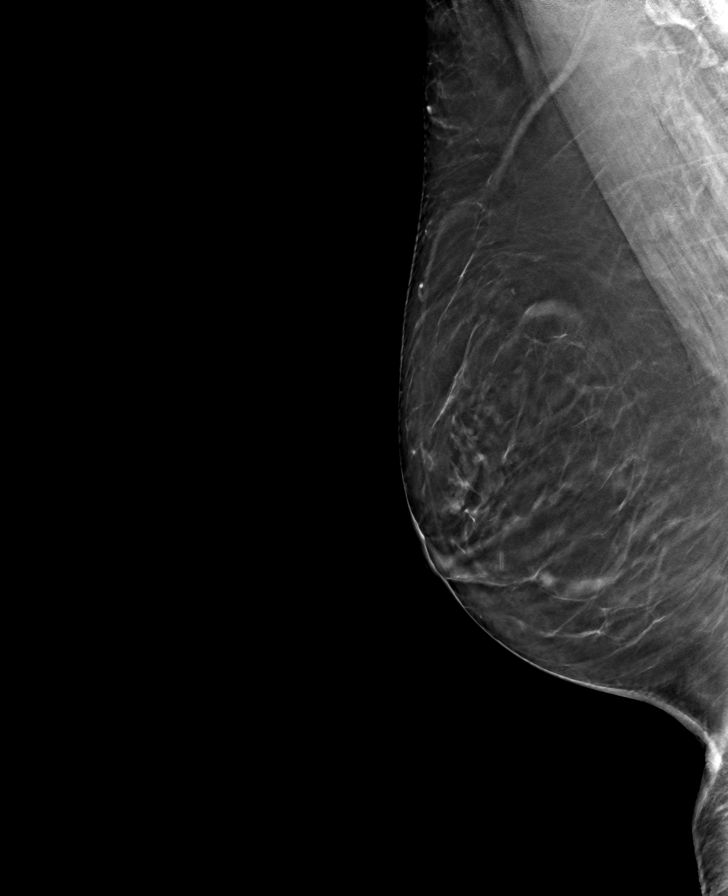

[8 of 24 positions shown; findings below may reference images not displayed]

ACR Breast Density Category b: There are scattered areas of
fibroglandular density.
FINDINGS: There are no findings suspicious for malignancy.
IMPRESSION: No mammographic evidence of malignancy. A result letter of this
screening mammogram will be mailed directly to the patient.

RECOMMENDATION:
Screening mammogram in one year. (Code:51-O-LD2)

BI-RADS CATEGORY  1: Negative.

## 2022-08-09 MED ORDER — AMOXICILLIN-POT CLAVULANATE 875-125 MG PO TABS: 1.0000 | ORAL_TABLET | Freq: Two times a day (BID) | ORAL | 0 refills | Status: DC

## 2022-08-09 MED ORDER — HYDROCHLOROTHIAZIDE 25 MG PO TABS: 25.0000 mg | ORAL_TABLET | Freq: Every day | ORAL | 3 refills | Status: DC | PRN

## 2022-08-09 MED ORDER — ROSUVASTATIN CALCIUM 20 MG PO TABS: 20.0000 mg | ORAL_TABLET | Freq: Every day | ORAL | 3 refills | Status: DC

## 2022-08-09 NOTE — Progress Notes (Signed)
Established Patient Office Visit  Subjective   Patient ID: Bridget Flynn, female    DOB: 03/20/1956  Age: 66 y.o. MRN: [id number]  Chief Complaint  Patient presents with   Headache    Patient complains of headaches intermittently x1 month, used Tylenol with some relief, states 2 weeks ago she noticed severe left temporal pain and used Tylenol with relief   Sinus Problem    Patient complains of nasal congestion, bad breath, clear nasal drainage and post nasal drip x2 months   Medication Refill    Patient requests refills on HCTZ and Crestor    HPI   Bridget Flynn has longstanding history of recurrent sinusitis.  She usually is treated with antibiotics at least a few times per year.  Back in 2015 she had a CT scan which showed pansinusitis.  She usually responds to Augmentin.  She currently relates at least 31-month history of intermittent headaches and nasal congestion.  She gets very little drainage out.  Chronic malaise.  Denies any recent purulent secretions or bloody discharge.  She is requesting refills of HCTZ and Crestor.  Last lipids were done last December.  Past Medical History:  Diagnosis Date   ALLERGIC RHINITIS 11/25/2007   Qualifier: Diagnosis of  By: Yetta Barre CNA/MA, Jessica     Allergy    Asthma    HYPERLIPIDEMIA 07/12/2009   Qualifier: Diagnosis of  By: Gabriel Rung LPN, Claudia Desanctis    VITAMIN D DEFICIENCY 07/12/2009   Qualifier: Diagnosis of  By: Gabriel Rung LPN, Harriett Sine     Past Surgical History:  Procedure Laterality Date   COLONOSCOPY W/ POLYPECTOMY  2004   DILATION AND CURETTAGE OF UTERUS     LAPAROSCOPIC BILATERAL SALPINGECTOMY  08/24/08    reports that she has never smoked. She has never used smokeless tobacco. She reports that she does not currently use alcohol after a past usage of about 1.0 standard drink of alcohol per week. She reports that she does not use drugs. family history includes Cancer in her father, mother, and another family member; Cirrhosis in her  mother; Diabetes in her father, mother, and another family member. Allergies  Allergen Reactions   Allegra [Fexofenadine Hcl]     Depression, cryingspells   Biaxin [Clarithromycin]     REACTION: diarrhea, vomiting   Erythromycin     REACTION: GI upset   Lipitor [Atorvastatin] Other (See Comments)    arthralgias   Sulfa Antibiotics     Review of Systems  Constitutional:  Negative for chills and fever.  HENT:  Positive for congestion and sinus pain. Negative for sore throat.   Respiratory:  Negative for cough.   Neurological:  Positive for headaches.      Objective:     BP (!) 140/80 (BP Location: Left Arm, Patient Position: Sitting, Cuff Size: Normal)   Pulse 65   Temp 98.1 F (36.7 C) (Oral)   Ht 5\' 3"  (1.6 m)   Wt 146 lb 8 oz (66.5 kg)   LMP 10/26/2005   SpO2 98%   BMI 25.95 kg/m    Physical Exam Vitals reviewed.  Constitutional:      Appearance: She is well-developed.  HENT:     Mouth/Throat:     Mouth: Mucous membranes are moist.     Pharynx: Oropharynx is clear.  Cardiovascular:     Rate and Rhythm: Normal rate and regular rhythm.  Pulmonary:     Effort: Pulmonary effort is normal.     Breath sounds:  Normal breath sounds. No wheezing or rales.  Neurological:     Mental Status: She is alert.      No results found for any visits on 08/09/22.    The 10-year ASCVD risk score (Arnett DK, et al., 2019) is: 9.3%    Assessment & Plan:    Chronic sinusitis.  Patient has had multiple flareups for several years and relates several months of chronic symptoms now.  -Start back Augmentin 875 mg twice daily -Set up CT maxillofacial sinuses to further assess -Consider ENT consult based on x-ray results above     Evelena Peat, MD

## 2022-08-09 NOTE — Patient Instructions (Signed)
I will set up repeat CT of sinuses  Will likely be setting up ENT referral but let's get CT back first.

## 2022-08-23 ENCOUNTER — Encounter (HOSPITAL_BASED_OUTPATIENT_CLINIC_OR_DEPARTMENT_OTHER): Payer: Self-pay | Admitting: Obstetrics & Gynecology

## 2022-09-15 ENCOUNTER — Ambulatory Visit (INDEPENDENT_AMBULATORY_CARE_PROVIDER_SITE_OTHER): Payer: Medicare Other | Admitting: Family Medicine

## 2022-09-15 ENCOUNTER — Encounter: Payer: Self-pay | Admitting: Family Medicine

## 2022-09-15 VITALS — BP 110/70 | HR 78 | Temp 98.1°F | Ht 63.0 in | Wt 147.0 lb

## 2022-09-15 DIAGNOSIS — J329 Chronic sinusitis, unspecified: Secondary | ICD-10-CM

## 2022-09-15 DIAGNOSIS — E785 Hyperlipidemia, unspecified: Secondary | ICD-10-CM | POA: Diagnosis not present

## 2022-09-15 DIAGNOSIS — I10 Essential (primary) hypertension: Secondary | ICD-10-CM

## 2022-09-15 DIAGNOSIS — R5383 Other fatigue: Secondary | ICD-10-CM

## 2022-09-15 LAB — BASIC METABOLIC PANEL
BUN: 17 mg/dL (ref 6–23)
CO2: 34 mEq/L — ABNORMAL HIGH (ref 19–32)
Calcium: 10.7 mg/dL — ABNORMAL HIGH (ref 8.4–10.5)
Chloride: 94 mEq/L — ABNORMAL LOW (ref 96–112)
Creatinine, Ser: 0.67 mg/dL (ref 0.40–1.20)
GFR: 90.06 mL/min (ref >60.00)
Glucose, Bld: 103 mg/dL — ABNORMAL HIGH (ref 70–99)
Potassium: 4.6 mEq/L (ref 3.5–5.1)
Sodium: 139 mEq/L (ref 135–145)

## 2022-09-15 LAB — LIPID PANEL
Cholesterol: 166 mg/dL (ref 0–200)
HDL: 57.3 mg/dL (ref >39.00)
LDL Cholesterol: 95 mg/dL (ref 0–99)
NonHDL: 108.39
Total CHOL/HDL Ratio: 3
Triglycerides: 67 mg/dL (ref 0.0–149.0)
VLDL: 13.4 mg/dL (ref 0.0–40.0)

## 2022-09-15 LAB — CBC WITH DIFFERENTIAL/PLATELET
Basophils Absolute: 0.1 10*3/uL (ref 0.0–0.1)
Basophils Relative: 0.8 % (ref 0.0–3.0)
Eosinophils Absolute: 0.4 10*3/uL (ref 0.0–0.7)
Eosinophils Relative: 4.6 % (ref 0.0–5.0)
HCT: 42.1 % (ref 36.0–46.0)
Hemoglobin: 14.3 g/dL (ref 12.0–15.0)
Lymphocytes Relative: 28.8 % (ref 12.0–46.0)
Lymphs Abs: 2.3 10*3/uL (ref 0.7–4.0)
MCHC: 34 g/dL (ref 30.0–36.0)
MCV: 85.1 fl (ref 78.0–100.0)
Monocytes Absolute: 0.9 10*3/uL (ref 0.1–1.0)
Monocytes Relative: 11.1 % (ref 3.0–12.0)
Neutro Abs: 4.4 10*3/uL (ref 1.4–7.7)
Neutrophils Relative %: 54.7 % (ref 43.0–77.0)
Platelets: 352 10*3/uL (ref 150.0–400.0)
RBC: 4.94 Mil/uL (ref 3.87–5.11)
RDW: 13.3 % (ref 11.5–15.5)
WBC: 8.1 10*3/uL (ref 4.0–10.5)

## 2022-09-15 LAB — HEPATIC FUNCTION PANEL
ALT: 19 U/L (ref 0–35)
AST: 20 U/L (ref 0–37)
Albumin: 4.8 g/dL (ref 3.5–5.2)
Alkaline Phosphatase: 47 U/L (ref 39–117)
Bilirubin, Direct: 0.1 mg/dL (ref 0.0–0.3)
Total Bilirubin: 0.4 mg/dL (ref 0.2–1.2)
Total Protein: 8.1 g/dL (ref 6.0–8.3)

## 2022-09-15 MED ORDER — AMOXICILLIN-POT CLAVULANATE 875-125 MG PO TABS: 1.0000 | ORAL_TABLET | Freq: Two times a day (BID) | ORAL | 0 refills | Status: DC

## 2022-09-15 NOTE — Progress Notes (Signed)
Established Patient Office Visit  Subjective   Patient ID: Bridget Flynn, female    DOB: 1956/08/04  Age: 66 y.o. MRN: [id number]  Chief Complaint  Patient presents with   Sinus Problem    HPI   Bridget Flynn is seen for the following issues  History of frequent sinusitis.  She was treated recently with Augmentin and did feel somewhat better when she is taking antibiotics but then Saturday after she finished she started having recurrence of sore throat and congestion and sinus pressure and pain and intermittent headaches.  She been taking over-the-counter Mucinex.  Increased fatigue.  Occasional greenish nasal mucus.  Occasional productive cough.  Feels poorly in general.  She has had some recent issues with progressive fatigue and also notices that her hands and feet feel more cold than usual.  Also feels that her hair is thinning some.  She knows this may be genetic or age-related but she has not had recent thyroid test.  She has hyperlipidemia treated with Crestor.  Due for follow-up labs.  Past Medical History:  Diagnosis Date   ALLERGIC RHINITIS 11/25/2007   Qualifier: Diagnosis of  By: Yetta Barre CNA/MA, Jessica     Allergy    Asthma    HYPERLIPIDEMIA 07/12/2009   Qualifier: Diagnosis of  By: Gabriel Rung LPN, Harriett Sine     Microscopic hematuria    negative urology evaluation   Osteopenia    VITAMIN D DEFICIENCY 07/12/2009   Qualifier: Diagnosis of  By: Gabriel Rung LPN, Harriett Sine     Past Surgical History:  Procedure Laterality Date   COLONOSCOPY W/ POLYPECTOMY  2004   DILATION AND CURETTAGE OF UTERUS     LAPAROSCOPIC BILATERAL SALPINGECTOMY  08/24/08    reports that she has never smoked. She has never used smokeless tobacco. She reports that she does not currently use alcohol after a past usage of about 1.0 standard drink of alcohol per week. She reports that she does not use drugs. family history includes Cancer in her father, mother, and another family member; Cirrhosis in her mother; Diabetes  in her father, mother, and another family member. Allergies  Allergen Reactions   Allegra [Fexofenadine Hcl]     Depression, cryingspells   Biaxin [Clarithromycin]     REACTION: diarrhea, vomiting   Erythromycin     REACTION: GI upset   Lipitor [Atorvastatin] Other (See Comments)    arthralgias   Sulfa Antibiotics     Review of Systems  Constitutional:  Positive for malaise/fatigue. Negative for chills and fever.  HENT:  Positive for congestion and sinus pain.   Respiratory:  Positive for cough.   Cardiovascular:  Negative for chest pain.  Gastrointestinal:  Negative for abdominal pain.  Genitourinary:  Negative for dysuria.  Neurological:  Positive for headaches.      Objective:     BP 110/70   Pulse 78   Temp 98.1 F (36.7 C) (Oral)   Ht 5\' 3"  (1.6 m)   Wt 147 lb (66.7 kg)   LMP 10/26/2005   SpO2 98%   BMI 26.04 kg/m    Physical Exam Vitals reviewed.  Constitutional:      General: She is not in acute distress.    Appearance: She is well-developed. She is not ill-appearing.  HENT:     Mouth/Throat:     Mouth: Mucous membranes are moist.     Pharynx: Oropharynx is clear.  Eyes:     Pupils: Pupils are equal, round, and reactive to light.  Neck:  Thyroid: No thyromegaly.     Vascular: No JVD.  Cardiovascular:     Rate and Rhythm: Normal rate and regular rhythm.     Heart sounds:     No gallop.  Pulmonary:     Effort: Pulmonary effort is normal. No respiratory distress.     Breath sounds: Normal breath sounds. No wheezing or rales.  Musculoskeletal:     Cervical back: Neck supple.  Lymphadenopathy:     Cervical: No cervical adenopathy.  Neurological:     Mental Status: She is alert.      No results found for any visits on 09/15/22.    The 10-year ASCVD risk score (Arnett DK, et al., 2019) is: 6.6%    Assessment & Plan:   #1 recurrent sinusitis symptoms.  She has had history of frequent sinusitis in the past.  She has pending CT sinuses  for next week.  We agreed to go ahead and print off prescription for Augmentin to start if her symptoms worsen over the weekend.  #2 fatigue.  She relates increased malaise as well as cold intolerance and some possible diffuse thinning of the hair.  Check TSH to rule out hypothyroidism  #3 hyperlipidemia.  Patient on Crestor 20 mg daily.  Continue current Crestor dosage and recheck lipid and hepatic panel today.   No follow-ups on file.    Evelena Peat, MD

## 2022-09-16 LAB — TSH: TSH: 3.39 u[IU]/mL (ref 0.35–5.50)

## 2022-09-20 ENCOUNTER — Ambulatory Visit (HOSPITAL_BASED_OUTPATIENT_CLINIC_OR_DEPARTMENT_OTHER)
Admission: RE | Admit: 2022-09-20 | Discharge: 2022-09-20 | Disposition: A | Discharge status: Home / Self Care | Payer: Medicare Other | Source: Ambulatory Visit | Attending: Family Medicine | Admitting: Family Medicine

## 2022-09-20 DIAGNOSIS — J324 Chronic pansinusitis: Secondary | ICD-10-CM | POA: Diagnosis present

## 2022-09-22 ENCOUNTER — Encounter: Payer: Self-pay | Admitting: Family Medicine

## 2022-09-22 DIAGNOSIS — J329 Chronic sinusitis, unspecified: Secondary | ICD-10-CM

## 2022-09-22 DIAGNOSIS — J324 Chronic pansinusitis: Secondary | ICD-10-CM

## 2022-09-26 ENCOUNTER — Other Ambulatory Visit: Payer: Self-pay | Admitting: Obstetrics & Gynecology

## 2022-09-26 DIAGNOSIS — Z1231 Encounter for screening mammogram for malignant neoplasm of breast: Secondary | ICD-10-CM

## 2022-10-20 ENCOUNTER — Encounter: Payer: Self-pay | Admitting: Family Medicine

## 2022-10-23 MED ORDER — ROSUVASTATIN CALCIUM 20 MG PO TABS: 20.0000 mg | ORAL_TABLET | Freq: Every day | ORAL | 3 refills | Status: DC

## 2022-10-23 MED ORDER — HYDROCHLOROTHIAZIDE 25 MG PO TABS: 25.0000 mg | ORAL_TABLET | Freq: Every day | ORAL | 3 refills | Status: DC | PRN

## 2022-11-07 ENCOUNTER — Encounter: Payer: Self-pay | Admitting: Family Medicine

## 2022-11-07 ENCOUNTER — Telehealth: Payer: Self-pay | Admitting: Family Medicine

## 2022-11-07 ENCOUNTER — Ambulatory Visit (INDEPENDENT_AMBULATORY_CARE_PROVIDER_SITE_OTHER): Payer: Medicare Other | Admitting: Family Medicine

## 2022-11-07 VITALS — BP 152/74 | HR 65 | Temp 97.9°F | Ht 63.0 in | Wt 150.6 lb

## 2022-11-07 DIAGNOSIS — R21 Rash and other nonspecific skin eruption: Secondary | ICD-10-CM

## 2022-11-07 DIAGNOSIS — M79641 Pain in right hand: Secondary | ICD-10-CM

## 2022-11-07 MED ORDER — TRIAMCINOLONE ACETONIDE 0.025 % EX CREA: 1.0000 | TOPICAL_CREAM | Freq: Two times a day (BID) | CUTANEOUS | 0 refills | Status: AC

## 2022-11-07 NOTE — Telephone Encounter (Signed)
I spoke with Reem with [facility] and she was informed of the message below.

## 2022-11-07 NOTE — Patient Instructions (Signed)
Go for X-ray of right hand at [facility] M-F 8 AM to 5 PM

## 2022-11-07 NOTE — Progress Notes (Signed)
Established Patient Office Visit  Subjective   Patient ID: Bridget Flynn, female    DOB: 1956/03/27  Age: 67 y.o. MRN: [id number]  Chief Complaint  Patient presents with   Finger Injury    Patient complains of right finger injury, Patient reported she was playing with grandchild this morning when injury occurred   Skin Problem    X3 months    HPI   Bridget Flynn is seen for the following issues  Right hand injury.  Earlier today she was playing with her grandchild.  She was running along in her hand hit against a fence .  She noted a little swelling of the dorsum of the hand and very mild pain radiating toward the right ring finger.  She is able to fully move all digits of the hand.  No wrist pain.  Second issue is she has couple of small erythematous patches lower neck anteriorly.  Slightly pruritic.  Slightly scaly.  No recent change of jewelry.  No new necklaces.  She has slightly scaly pruritic area right upper anterior chest.  Past Medical History:  Diagnosis Date   ALLERGIC RHINITIS 11/25/2007   Qualifier: Diagnosis of  By: Ronnald Ramp CNA/MA, Jessica     Allergy    Asthma    HYPERLIPIDEMIA 07/12/2009   Qualifier: Diagnosis of  By: Valma Cava LPN, Izora Gala     Microscopic hematuria    negative urology evaluation   Osteopenia    VITAMIN D DEFICIENCY 07/12/2009   Qualifier: Diagnosis of  By: Valma Cava LPN, Izora Gala     Past Surgical History:  Procedure Laterality Date   COLONOSCOPY W/ POLYPECTOMY  2004   DILATION AND CURETTAGE OF UTERUS     LAPAROSCOPIC BILATERAL SALPINGECTOMY  08/24/08    reports that she has never smoked. She has never used smokeless tobacco. She reports that she does not currently use alcohol after a past usage of about 1.0 standard drink of alcohol per week. She reports that she does not use drugs. family history includes Cancer in her father, mother, and another family member; Cirrhosis in her mother; Diabetes in her father, mother, and another family member. Allergies   Allergen Reactions   Allegra [Fexofenadine Hcl]     Depression, cryingspells   Biaxin [Clarithromycin]     REACTION: diarrhea, vomiting   Erythromycin     REACTION: GI upset   Lipitor [Atorvastatin] Other (See Comments)    arthralgias   Sulfa Antibiotics     Review of Systems  Constitutional:  Negative for chills and fever.  Skin:  Positive for rash.      Objective:     BP (!) 152/74 (BP Location: Left Arm, Patient Position: Sitting, Cuff Size: Normal)   Pulse 65   Temp 97.9 F (36.6 C) (Oral)   Ht 5' 3"$  (1.6 m)   Wt 150 lb 9.6 oz (68.3 kg)   LMP 10/26/2005   SpO2 97%   BMI 26.68 kg/m    Physical Exam Vitals reviewed.  Constitutional:      Appearance: Normal appearance.  Cardiovascular:     Rate and Rhythm: Normal rate and regular rhythm.  Musculoskeletal:     Comments: Right hand reveals very mild swelling and some mild bruising dorsally over the fourth metacarpal region.  She has mild tenderness over the proximal fourth metacarpal.  She has full range of motion all digits of the hand.  No bony tenderness of the fourth digit.  No fifth metacarpal tenderness.  Skin:    Comments: He  has a couple small approximately one half  by 1/2 cm erythematous patches with slightly scaly surface lower anterior neck.  Right upper chest region reveals small scaly slightly excoriated area about 2 x 3 mm.  No nodular changes.  No ulceration.  No necrosis.  No pigmentary change.  Neurological:     Mental Status: She is alert.      No results found for any visits on 11/07/22.    The 10-year ASCVD risk score (Arnett DK, et al., 2019) is: 13.1%    Assessment & Plan:   #1 right hand pain following injury.  Suspect probably contusion.  Obtain x-ray to rule out fracture.  #2 couple small areas of rash anterior neck.  These look like possible contact dermatitis type rash.  Right upper chest wall reveals nonspecific excoriated area of irritation.  No evidence for  malignancy. -Avoid scratching is much as possible -Triamcinolone 0.025% cream to use once or twice daily as needed   No follow-ups on file.    Carolann Littler, MD

## 2022-11-07 NOTE — Telephone Encounter (Signed)
[facility] potential allergic between crestor and liptor, Please call marianne at [phone]  reference number is [id number]

## 2022-11-08 ENCOUNTER — Other Ambulatory Visit: Payer: Self-pay

## 2022-11-08 ENCOUNTER — Ambulatory Visit (HOSPITAL_BASED_OUTPATIENT_CLINIC_OR_DEPARTMENT_OTHER)
Admission: RE | Admit: 2022-11-08 | Discharge: 2022-11-08 | Disposition: A | Discharge status: Home / Self Care | Payer: Medicare Other | Source: Ambulatory Visit | Attending: Family Medicine | Admitting: Family Medicine

## 2022-11-08 DIAGNOSIS — S62339B Displaced fracture of neck of unspecified metacarpal bone, initial encounter for open fracture: Secondary | ICD-10-CM

## 2022-11-08 DIAGNOSIS — M79641 Pain in right hand: Secondary | ICD-10-CM

## 2022-11-15 ENCOUNTER — Ambulatory Visit
Admission: RE | Admit: 2022-11-15 | Discharge: 2022-11-15 | Disposition: A | Discharge status: Home / Self Care | Payer: Medicare Other | Source: Ambulatory Visit | Attending: Obstetrics & Gynecology | Admitting: Obstetrics & Gynecology

## 2022-11-15 DIAGNOSIS — Z1231 Encounter for screening mammogram for malignant neoplasm of breast: Secondary | ICD-10-CM

## 2022-12-26 ENCOUNTER — Other Ambulatory Visit: Payer: Self-pay | Admitting: Otolaryngology

## 2023-01-09 ENCOUNTER — Telehealth: Payer: Self-pay | Admitting: Family Medicine

## 2023-01-09 NOTE — Telephone Encounter (Signed)
Contacted Bridget Flynn to schedule their annual wellness visit. Appointment made for 01/18/23.  Rudell Cobb AWV direct phone # [phone]   Due to schedule change moved 4/25 appt to [employer] schedule

## 2023-01-11 ENCOUNTER — Telehealth: Payer: Self-pay | Admitting: Family Medicine

## 2023-01-11 NOTE — Telephone Encounter (Signed)
Contacted Bridget Flynn to schedule their annual wellness visit. Appointment made for 01/25/23.  Bridget Flynn AWV direct phone # [phone]

## 2023-01-18 ENCOUNTER — Telehealth: Payer: Medicare Other | Admitting: Family Medicine

## 2023-01-18 ENCOUNTER — Ambulatory Visit: Payer: Medicare Other

## 2023-01-19 NOTE — Pre-Procedure Instructions (Addendum)
Surgical Instructions    Your procedure is scheduled on Jan 26, 2023.  Report to [facility] Main Entrance "A" at 7:45 A.M., then check in with the Admitting office.  Call this number if you have problems the morning of surgery:  [phone]  If you have any questions prior to your surgery date call [phone]: Open Monday-Friday 8am-4pm If you experience any cold or flu symptoms such as cough, fever, chills, shortness of breath, etc. between now and your scheduled surgery, please notify us at the above number.     Remember:  Do not eat after midnight the night before your surgery  You may drink clear liquids until 6:45 AM the morning of your surgery.   Clear liquids allowed are: Water, Non-Citrus Juices (without pulp), Carbonated Beverages, Clear Tea, Black Coffee Only (NO MILK, CREAM OR POWDERED CREAMER of any kind), and Gatorade.     Take these medicines the morning of surgery with A SIP OF WATER:  fluticasone (FLONASE) nasal spray  Chlorpheniramine Maleate     As of today, STOP taking any Aspirin (unless otherwise instructed by your surgeon) Aleve, Naproxen, Ibuprofen, Motrin, Advil, Goody's, BC's, all herbal medications, fish oil, and all vitamins.                     Do NOT Smoke (Tobacco/Vaping) for 24 hours prior to your procedure.  If you use a CPAP at night, you may bring your mask/headgear for your overnight stay.   Contacts, glasses, piercing's, hearing aid's, dentures or partials may not be worn into surgery, please bring cases for these belongings.    For patients admitted to the hospital, discharge time will be determined by your treatment team.   Patients discharged the day of surgery will not be allowed to drive home, and someone needs to stay with them for 24 hours.  SURGICAL WAITING ROOM VISITATION Patients having surgery or a procedure may have no more than 2 support people in the waiting area - these visitors may rotate.   Children under the age of 23 must  have an adult with them who is not the patient. If the patient needs to stay at the hospital during part of their recovery, the visitor guidelines for inpatient rooms apply. Pre-op nurse will coordinate an appropriate time for 1 support person to accompany patient in pre-op.  This support person may not rotate.   Please refer to the [facility] website for the visitor guidelines for Inpatients (after your surgery is over and you are in a regular room).    Special instructions:   [place]- Preparing For Surgery  Before surgery, you can play an important role. Because skin is not sterile, your skin needs to be as free of germs as possible. You can reduce the number of germs on your skin by washing with CHG (chlorahexidine gluconate) Soap before surgery.  CHG is an antiseptic cleaner which kills germs and bonds with the skin to continue killing germs even after washing.    Oral Hygiene is also important to reduce your risk of infection.  Remember - BRUSH YOUR TEETH THE MORNING OF SURGERY WITH YOUR REGULAR TOOTHPASTE  Please do not use if you have an allergy to CHG or antibacterial soaps. If your skin becomes reddened/irritated stop using the CHG.  Do not shave (including legs and underarms) for at least 48 hours prior to first CHG shower. It is OK to shave your face.  Please follow these instructions carefully.   Shower  the NIGHT BEFORE SURGERY and the MORNING OF SURGERY  If you chose to wash your hair, wash your hair first as usual with your normal shampoo.  After you shampoo, rinse your hair and body thoroughly to remove the shampoo.  Use CHG Soap as you would any other liquid soap. You can apply CHG directly to the skin and wash gently with a scrungie or a clean washcloth.   Apply the CHG Soap to your body ONLY FROM THE NECK DOWN.  Do not use on open wounds or open sores. Avoid contact with your eyes, ears, mouth and genitals (private parts). Wash Face and genitals (private parts)  with  your normal soap.   Wash thoroughly, paying special attention to the area where your surgery will be performed.  Thoroughly rinse your body with warm water from the neck down.  DO NOT shower/wash with your normal soap after using and rinsing off the CHG Soap.  Pat yourself dry with a CLEAN TOWEL.  Wear CLEAN PAJAMAS to bed the night before surgery  Place CLEAN SHEETS on your bed the night before your surgery  DO NOT SLEEP WITH PETS.   Day of Surgery: Take a shower with CHG soap. Do not wear jewelry or makeup Do not wear lotions, powders, perfumes/colognes, or deodorant. Do not shave 48 hours prior to surgery.  Men may shave face and neck. Do not bring valuables to the hospital.  [facility] is not responsible for any belongings or valuables. Do not wear nail polish, gel polish, artificial nails, or any other type of covering on natural nails (fingers and toes) If you have artificial nails or gel coating that need to be removed by a nail salon, please have this removed prior to surgery. Artificial nails or gel coating may interfere with anesthesia's ability to adequately monitor your vital signs.  Wear Clean/Comfortable clothing the morning of surgery Remember to brush your teeth WITH YOUR REGULAR TOOTHPASTE.   Please read over the following fact sheets that you were given.    If you received a COVID test during your pre-op visit  it is requested that you wear a mask when out in public, stay away from anyone that may not be feeling well and notify your surgeon if you develop symptoms. If you have been in contact with anyone that has tested positive in the last 10 days please notify you surgeon.

## 2023-01-22 ENCOUNTER — Other Ambulatory Visit: Payer: Self-pay

## 2023-01-22 ENCOUNTER — Encounter (HOSPITAL_COMMUNITY): Payer: Self-pay

## 2023-01-22 ENCOUNTER — Encounter (HOSPITAL_COMMUNITY)
Admission: RE | Admit: 2023-01-22 | Discharge: 2023-01-22 | Disposition: A | Discharge status: Home / Self Care | Payer: Medicare Other | Source: Ambulatory Visit | Attending: Otolaryngology | Admitting: Otolaryngology

## 2023-01-22 VITALS — BP 151/94 | HR 66 | Temp 98.0°F | Resp 16 | Ht 63.0 in | Wt 148.7 lb

## 2023-01-22 DIAGNOSIS — Z01818 Encounter for other preprocedural examination: Secondary | ICD-10-CM | POA: Diagnosis present

## 2023-01-22 DIAGNOSIS — I10 Essential (primary) hypertension: Secondary | ICD-10-CM

## 2023-01-22 HISTORY — DX: Headache, unspecified: R51.9

## 2023-01-22 HISTORY — DX: Essential (primary) hypertension: I10

## 2023-01-22 LAB — BASIC METABOLIC PANEL
Anion gap: 11 (ref 5–15)
BUN: 13 mg/dL (ref 8–23)
CO2: 34 mmol/L — ABNORMAL HIGH (ref 22–32)
Calcium: 9.3 mg/dL (ref 8.9–10.3)
Chloride: 92 mmol/L — ABNORMAL LOW (ref 98–111)
Creatinine, Ser: 0.61 mg/dL (ref 0.44–1.00)
GFR, Estimated: >60 mL/min (ref >60)
Glucose, Bld: 95 mg/dL (ref 70–99)
Potassium: 2.7 mmol/L — CL (ref 3.5–5.1)
Sodium: 137 mmol/L (ref 135–145)

## 2023-01-22 LAB — CBC
HCT: 35 % — ABNORMAL LOW (ref 36.0–46.0)
Hemoglobin: 12.4 g/dL (ref 12.0–15.0)
MCH: 29.7 pg (ref 26.0–34.0)
MCHC: 35.4 g/dL (ref 30.0–36.0)
MCV: 83.7 fL (ref 80.0–100.0)
Platelets: 279 10*3/uL (ref 150–400)
RBC: 4.18 MIL/uL (ref 3.87–5.11)
RDW: 12.5 % (ref 11.5–15.5)
WBC: 7.5 10*3/uL (ref 4.0–10.5)
nRBC: 0 % (ref 0.0–0.2)

## 2023-01-22 NOTE — Progress Notes (Signed)
Potassium of 2.7 reported to Angie at Dr. Skotnicki's office (LVM) 

## 2023-01-22 NOTE — Progress Notes (Signed)
PCP - Dr. Evelena Peat Cardiologist - denies  PPM/ICD - denies   Chest x-ray - denies EKG - 01/22/23 Stress Test - denies ECHO - denies Cardiac Cath - denies  Sleep Study - denies   DM- denies  ASA/Blood Thinner Instructions: n/a   ERAS Protcol - yes, no drink   COVID TEST- n/a   Anesthesia review: no  Patient denies shortness of breath, fever, cough and chest pain at PAT appointment   All instructions explained to the patient, with a verbal understanding of the material. Patient agrees to go over the instructions while at home for a better understanding.  The opportunity to ask questions was provided.

## 2023-01-23 NOTE — Anesthesia Preprocedure Evaluation (Addendum)
Anesthesia Evaluation  Patient identified by MRN, date of birth, ID band Patient awake    Reviewed: Allergy & Precautions, NPO status , Patient's Chart, lab work & pertinent test results  History of Anesthesia Complications Negative for: history of anesthetic complications  Airway Mallampati: II  TM Distance: >3 FB Neck ROM: Full    Dental no notable dental hx.    Pulmonary asthma    Pulmonary exam normal        Cardiovascular hypertension, Pt. on medications Normal cardiovascular exam     Neuro/Psych  Headaches    GI/Hepatic negative GI ROS, Neg liver ROS,,,  Endo/Other  negative endocrine ROS    Renal/GU negative Renal ROS  negative genitourinary   Musculoskeletal negative musculoskeletal ROS (+)    Abdominal   Peds  Hematology negative hematology ROS (+)   Anesthesia Other Findings Chronic pansinusitis  Reproductive/Obstetrics                              Anesthesia Physical Anesthesia Plan  ASA: 2  Anesthesia Plan: General   Post-op Pain Management: Tylenol PO (pre-op)*   Induction: Intravenous  PONV Risk Score and Plan: 3 and Treatment may vary due to age or medical condition, Midazolam, Dexamethasone and Ondansetron  Airway Management Planned: Oral ETT  Additional Equipment: None  Intra-op Plan:   Post-operative Plan: Extubation in OR  Informed Consent: I have reviewed the patients History and Physical, chart, labs and discussed the procedure including the risks, benefits and alternatives for the proposed anesthesia with the patient or authorized representative who has indicated his/her understanding and acceptance.     Dental advisory given  Plan Discussed with: CRNA  Anesthesia Plan Comments: (Preop labs notable for hypokalemia with potassium 2.7. Dr. Marene Lenz notified. Will recheck BMP DOS. )         Anesthesia Quick Evaluation

## 2023-01-25 ENCOUNTER — Encounter: Payer: Self-pay | Admitting: Family Medicine

## 2023-01-25 ENCOUNTER — Telehealth (INDEPENDENT_AMBULATORY_CARE_PROVIDER_SITE_OTHER): Payer: Medicare Other | Admitting: Family Medicine

## 2023-01-25 VITALS — Ht 63.0 in | Wt 148.7 lb

## 2023-01-25 DIAGNOSIS — Z0000 Encounter for general adult medical examination without abnormal findings: Secondary | ICD-10-CM

## 2023-01-25 NOTE — Patient Instructions (Signed)
I really enjoyed getting to talk with you today! I am available on Tuesdays and Thursdays for virtual visits if you have any questions or concerns, or if I can be of any further assistance.   CHECKLIST FROM ANNUAL WELLNESS VISIT:  -Follow up (please call to schedule if not scheduled after visit):   -yearly for annual wellness visit with primary care office  Here is a list of your preventive care/health maintenance measures and the plan for each if any are due:  PLAN For any measures below that may be due:  -get 2nd shingrix vaccine when able at pharmacy and then let us know so that we can update it -covid boosters can be obtained at pharmacy -if/when you wish to repeat your bone density please let us know  Health Maintenance  Topic Date Due   Zoster Vaccines- Shingrix (2 of 2) 07/24/2018   COVID-19 Vaccine (7 - 2023-24 season) 08/23/2022   INFLUENZA VACCINE  04/26/2023   MAMMOGRAM  11/16/2023   Medicare Annual Wellness (AWV)  01/25/2024   Pneumonia Vaccine 24+ Years old (3 of 3 - PPSV23 or PCV20) 09/06/2025   COLONOSCOPY (Pts 45-47yrs Insurance coverage will need to be confirmed)  01/22/2028   DTaP/Tdap/Td (3 - Td or Tdap) 06/03/2029   DEXA SCAN  Completed   Hepatitis C Screening  Completed   HPV VACCINES  Aged Out    -See a dentist at least yearly  -Get your eyes checked and then per your eye specialist's recommendations  -Other issues addressed today:   -I have included below further information regarding a healthy whole foods based diet, physical activity guidelines for adults, stress management and opportunities for social connections. I hope you find this information useful.   -----------------------------------------------------------------------------------------------------------------------------------------------------------------------------------------------------------------------------------------------------------  NUTRITION: -eat real food: lots of colorful  vegetables (half the plate) and fruits -5-7 servings of vegetables and fruits per day (fresh or steamed is best), exp. 2 servings of vegetables with lunch and dinner and 2 servings of fruit per day. Berries and greens such as kale and collards are great choices.  -consume on a regular basis: whole grains (make sure first ingredient on label contains the word "whole"), fresh fruits, fish, nuts, seeds, healthy oils (such as olive oil, avocado oil, grape seed oil) -may eat small amounts of dairy and lean meat on occasion, but avoid processed meats such as ham, bacon, lunch meat, etc. -drink water -try to avoid fast food and pre-packaged foods, processed meat -most experts advise limiting sodium to < 2300mg  per day, should limit further is any chronic conditions such as high blood pressure, heart disease, diabetes, etc. The American Heart Association advised that < 1500mg  is is ideal -try to avoid foods that contain any ingredients with names you do not recognize  -try to avoid sugar/sweets (except for the natural sugar that occurs in fresh fruit) -try to avoid sweet drinks -try to avoid white rice, white bread, pasta (unless whole grain), white or yellow potatoes  EXERCISE GUIDELINES FOR ADULTS: -if you wish to increase your physical activity, do so gradually and with the approval of your doctor -STOP and seek medical care immediately if you have any chest pain, chest discomfort or trouble breathing when starting or increasing exercise  -move and stretch your body, legs, feet and arms when sitting for long periods -Physical activity guidelines for optimal health in adults: -least 150 minutes per week of aerobic exercise (can talk, but not sing) once approved by your doctor, 20-30 minutes of sustained activity or  two 10 minute episodes of sustained activity every day.  -resistance training at least 2 days per week if approved by your doctor -balance exercises 3+ days per week:   Stand somewhere where  you have something sturdy to hold onto if you lose balance.    1) lift up on toes, start with 5x per day and work up to 20x   2) stand and lift on leg straight out to the side so that foot is a few inches of the floor, start with 5x each side and work up to 20x each side   3) stand on one foot, start with 5 seconds each side and work up to 20 seconds on each side  If you need ideas or help with getting more active:  -Silver sneakers https://tools.silversneakers.com  -Walk with a Doc: http://www.duncan-[email or web address]/  -try to include resistance (weight lifting/strength building) and balance exercises twice per week: or the following link for ideas: http://castillo-powell.com/  BuyDucts.dk  STRESS MANAGEMENT: -can try meditating, or just sitting quietly with deep breathing while intentionally relaxing all parts of your body for 5 minutes daily -if you need further help with stress, anxiety or depression please follow up with your primary doctor or contact the wonderful folks at [facility]: [phone]  SOCIAL CONNECTIONS: -options in [place] if you wish to engage in more social and exercise related activities:  -Silver sneakers https://tools.silversneakers.com  -Walk with a Doc: http://www.duncan-[email or web address]/  -Check out the [facility] Active Adults 50+ section on the [place] [employer] (hiking clubs, book clubs, cards and games, chess, exercise classes, aquatic classes and much more) - see the website for details: https://www.[place].gov/departments/parks-recreation/active-adults50  -YouTube has lots of exercise videos for different ages and abilities as well  -[facility] (a variety of indoor and outdoor inperson activities for adults). [phone]. [address]  -Virtual Online Classes (a variety of topics): see seniorplanet.org or call  [phone]  -consider volunteering at a school, hospice center, church, senior center or elsewhere

## 2023-01-25 NOTE — Progress Notes (Signed)
PATIENT CHECK-IN and HEALTH RISK ASSESSMENT QUESTIONNAIRE:  -completed by phone/video for upcoming Medicare Preventive Visit  Pre-Visit Check-in: 1)Vitals (height, wt, BP, etc) - record in vitals section for visit on day of visit 2)Review and Update Medications, Allergies PMH, Surgeries, Social history in Epic 3)Hospitalizations in the last year with date/reason? No  4)Review and Update Care Team (patient's specialists) in Epic 5) Complete PHQ9 in Epic  6) Complete Fall Screening in Epic 7)Review all Health Maintenance Due and order under PCP if not done.  8)Medicare Wellness Questionnaire: Answer theses question about your habits: Do you drink alcohol? No If yes, how many drinks do you have a day? Have you ever smoked?No Quit date if applicable? N/A  How many packs a day do/did you smoke? N/A Do you use smokeless tobacco?No Do you use an illicit drugs?No Do you exercises? Yes IF so, what type and how many days/minutes per week?Walking, frequency varies - used to do 2 miles or more per day and hikes sometimes 3-5 miles. But the last few months less due to sinus issues. Does have weights and does yoga. Goes boating - Psychologist, educational, sailing.  Are you sexually active? Yes Number of partners? 1 Typical breakfast: Oatmeal, Yogurt  Typical lunch: Salad, Proteins, Fruit  Typical dinner: Varies Typical snacks: Pretzels, chicken and cheese  Beverages: Coffee, Diet pepsi, Unsweetened tea,   Answer theses question about you: Can you perform most household chores? Yes  Do you find it hard to follow a conversation in a noisy room?No Do you often ask people to speak up or repeat themselves?No Do you feel that you have a problem with memory? No Do you balance your checkbook and or bank acounts?Yes Do you feel safe at home?Yes  Last dentist visit? 5 months ago   Do you need assistance with any of the following: Please note if so No  Driving?  Feeding yourself?  Getting from bed to  chair?  Getting to the toilet?  Bathing or showering?  Dressing yourself?  Managing money?  Climbing a flight of stairs  Preparing meals?  Do you have Advanced Directives in place (Living Will, Healthcare Power or Attorney)? Yes   Last eye Exam and location? [facility], 2023   Do you currently use prescribed or non-prescribed narcotic or opioid pain medications? No  Do you have a history or close family history of breast, ovarian, tubal or peritoneal cancer or a family member with BRCA (breast cancer susceptibility 1 and 2) gene mutations? Yes- Mother ovarian  Nurse/Assistant Credentials/time stamp: MG, 9:33 AM    ----------------------------------------------------------------------------------------------------------------------------------------------------------------------------------------------------------------------   MEDICARE ANNUAL PREVENTIVE VISIT WITH PROVIDER: (Welcome to [employer], initial annual wellness or annual wellness exam)  Virtual Visit via Video Note  I connected with Bridget Flynn on 01/25/23 by a video enabled telemedicine application and verified that I am speaking with the correct person using two identifiers.  Location patient: home Location provider:work or home office Persons participating in the virtual visit: patient, provider  Concerns and/or follow up today: reports doing ok. She has chronic issues with sinus infections - is getting sinus surgery. Reports has recheck on labs due to low potassium but feels is due to not eating during busy schedule and taking her hctz.    See HM section in Epic for other details of completed HM.    ROS: negative for report of fevers, unintentional weight loss, vision changes, vision loss, hearing loss or change, chest pain, sob, hemoptysis, melena, hematochezia, hematuria, falls, bleeding  or bruising, thoughts of suicide or self harm, memory loss  Patient-completed extensive health risk assessment  - reviewed and discussed with the patient: See Health Risk Assessment completed with patient prior to the visit either above or in recent phone note. This was reviewed in detailed with the patient today and appropriate recommendations, orders and referrals were placed as needed per Summary below and patient instructions.   Review of Medical History: -PMH, PSH, Family History and current specialty and care providers reviewed and updated and listed below   Patient Care Team: Kristian Covey, MD as PCP - General   Past Medical History:  Diagnosis Date   ALLERGIC RHINITIS 11/25/2007   Qualifier: Diagnosis of  By: Yetta Barre CNA/MA, Jessica     Allergy    Asthma    Headache    HYPERLIPIDEMIA 07/12/2009   Qualifier: Diagnosis of  By: Gabriel Rung LPN, Nancy     Hypertension    Microscopic hematuria    negative urology evaluation   Osteopenia    VITAMIN D DEFICIENCY 07/12/2009   Qualifier: Diagnosis of  By: Gabriel Rung LPN, Harriett Sine      Past Surgical History:  Procedure Laterality Date   COLONOSCOPY W/ POLYPECTOMY  2004   DILATION AND CURETTAGE OF UTERUS     LAPAROSCOPIC BILATERAL SALPINGECTOMY  08/24/08    Social History   Socioeconomic History   Marital status: Married    Spouse name: Not on file   Number of children: 2   Years of education: Not on file   Highest education level: Bachelor's degree (e.g., BA, AB, BS)  Occupational History   Not on file  Tobacco Use   Smoking status: Never   Smokeless tobacco: Never  Vaping Use   Vaping Use: Never used  Substance and Sexual Activity   Alcohol use: Not Currently   Drug use: No   Sexual activity: Yes    Partners: Male    Birth control/protection: Other-see comments    Comment: BSO  Other Topics Concern   Not on file  Social History Narrative   Not on file   Social Determinants of Health   Financial Resource Strain: Low Risk  (08/07/2022)   Overall Financial Resource Strain (CARDIA)    Difficulty of Paying Living Expenses:  Not hard at all  Food Insecurity: No Food Insecurity (08/07/2022)   Hunger Vital Sign    Worried About Running Out of Food in the Last Year: Never true    Ran Out of Food in the Last Year: Never true  Transportation Needs: No Transportation Needs (08/07/2022)   PRAPARE - Administrator, Civil Service (Medical): No    Lack of Transportation (Non-Medical): No  Physical Activity: Sufficiently Active (08/07/2022)   Exercise Vital Sign    Days of Exercise per Week: 4 days    Minutes of Exercise per Session: 40 min  Stress: No Stress Concern Present (08/07/2022)   [employer] of Occupational Health - Occupational Stress Questionnaire    Feeling of Stress : Not at all  Social Connections: Moderately Isolated (08/07/2022)   Social Connection and Isolation Panel [NHANES]    Frequency of Communication with Friends and Family: More than three times a week    Frequency of Social Gatherings with Friends and Family: Three times a week    Attends Religious Services: Never    Active Member of Clubs or Organizations: No    Attends Banker Meetings: Not on file    Marital Status: Married  Intimate  Partner Violence: Not At Risk (12/02/2020)   Humiliation, Afraid, Rape, and Kick questionnaire    Fear of Current or Ex-Partner: No    Emotionally Abused: No    Physically Abused: No    Sexually Abused: No    Family History  Problem Relation Age of Onset   Diabetes Mother    Cancer Mother        ovarian cancer   Cirrhosis Mother    Diabetes Father    Cancer Father        Hodgkins lymphoma   Diabetes Other    Cancer Other        ovarian   BRCA 1/2 Neg Hx    Colon cancer Neg Hx    Colon polyps Neg Hx    Esophageal cancer Neg Hx    Rectal cancer Neg Hx    Stomach cancer Neg Hx    Breast cancer Neg Hx     Current Outpatient Medications on File Prior to Visit  Medication Sig Dispense Refill   Chlorpheniramine Maleate (CHLOR-TABLETS PO) Take 4 mg by mouth daily as  needed (allergies.).     COVID-19 mRNA Vac-TriS, Pfizer, (PFIZER-BIONT COVID-19 VAC-TRIS) SUSP injection Inject into the muscle. 0.3 mL 0   estradiol (ESTRACE) 0.1 MG/GM vaginal cream 1 gram vaginally twice weekly and apply a small amount of cream topically to urethra at the same time (Patient taking differently: Place 1 Applicatorful vaginally 2 (two) times a week. 1 gram vaginally Mondays & Thursdays at night. Apply a small amount of cream topically to urethra at the same time) 42.5 g 2   fluticasone (FLONASE) 50 MCG/ACT nasal spray Place 1 spray into both nostrils daily.     guaiFENesin (MUCINEX) 600 MG 12 hr tablet Take 300 mg by mouth 2 (two) times daily as needed (congestion/cough).     hydrochlorothiazide (HYDRODIURIL) 25 MG tablet Take 1 tablet (25 mg total) by mouth daily as needed. (Patient taking differently: Take 12.5 mg by mouth in the morning and at bedtime.) 90 tablet 3   Potassium 99 MG TABS Take 198 mg by mouth in the morning.     rosuvastatin (CRESTOR) 20 MG tablet Take 1 tablet (20 mg total) by mouth daily. (Patient taking differently: Take 20 mg by mouth at bedtime.) 90 tablet 3   triamcinolone (KENALOG) 0.025 % cream Apply 1 Application topically 2 (two) times daily. Apply to affected rash twice daily. (Patient taking differently: Apply 1 Application topically 2 (two) times daily as needed (eczema).) 15 g 0   Current Facility-Administered Medications on File Prior to Visit  Medication Dose Route Frequency Provider Last Rate Last Admin   0.9 %  sodium chloride infusion  500 mL Intravenous Once Nandigam, Eleonore Chiquito, MD        Allergies  Allergen Reactions   Allegra [Fexofenadine Hcl] Other (See Comments)    Depression, cryingspells   Biaxin [Clarithromycin] Diarrhea, Nausea And Vomiting and Other (See Comments)    Syncope episode   Erythromycin Other (See Comments)    GI upset   Lipitor [Atorvastatin] Other (See Comments)    arthralgias   Sulfa Antibiotics Nausea Only        Physical Exam There were no vitals filed for this visit. Estimated body mass index is 26.34 kg/m as calculated from the following:   Height as of this encounter: 5\' 3"  (1.6 m).   Weight as of this encounter: 148 lb 11.2 oz (67.4 kg).  EKG (optional): deferred due to virtual visit  GENERAL: alert, oriented, no acute distress detected, full vision exam deferred due to pandemic and/or virtual encounter  HEENT: atraumatic, conjunttiva clear, no obvious abnormalities on inspection of external nose and ears  NECK: normal movements of the head and neck  LUNGS: on inspection no signs of respiratory distress, breathing rate appears normal, no obvious gross SOB, gasping or wheezing  CV: no obvious cyanosis  MS: moves all visible extremities without noticeable abnormality  PSYCH/NEURO: pleasant and cooperative, no obvious depression or anxiety, speech and thought processing grossly intact, Cognitive function grossly intact  Flowsheet Row Video Visit from 01/25/2023 in [facility] [place]  PHQ-9 Total Score 2           01/25/2023    9:22 AM 08/09/2022    4:05 PM 07/12/2022    9:56 AM 01/16/2022    4:10 PM 09/16/2021   12:49 PM  Depression screen PHQ 2/9  Decreased Interest 0 0 0 0 0  Down, Depressed, Hopeless 0 0 0 0 0  PHQ - 2 Score 0 0 0 0 0  Altered sleeping 0 1   0  Tired, decreased energy 2 1   1   Change in appetite 0 0   0  Feeling bad or failure about yourself  0 0   0  Trouble concentrating 0 0   0  Moving slowly or fidgety/restless 0 0   0  Suicidal thoughts 0 0   0  PHQ-9 Score 2 2   1   Difficult doing work/chores Not difficult at all Not difficult at all          09/16/2021   12:49 PM 01/15/2022   10:14 PM 01/16/2022    4:09 PM 09/14/2022    8:02 AM 01/25/2023    9:24 AM  Fall Risk  Falls in the past year? 0 1 1 1 1   Was there an injury with Fall? 0 0 0 0 0  Fall Risk Category Calculator 0 1 2 1 1   Fall Risk Category (Retired) Low  Low Moderate Low   (RETIRED) Patient Fall Risk Level Low fall risk  Low fall risk    Patient at Risk for Falls Due to No Fall Risks  Medication side effect  No Fall Risks  Fall risk Follow up   Falls evaluation completed;Education provided;Falls prevention discussed  Falls evaluation completed  Reports was a while ago. Was taking care of grandkids and tried to hop over a baby gate and tripped!   SUMMARY AND PLAN:  Encounter for Medicare annual wellness exam   Discussed applicable health maintenance/preventive health measures and advised and referred or ordered per patient preferences:  Health Maintenance  Topic Date Due   Zoster Vaccines- Shingrix (2 of 2) 07/24/2018 - she plans to get 2nd dose this summer   COVID-19 Vaccine (7 - 2023-24 season) 08/23/2022, discussed recommendations per cdc   INFLUENZA VACCINE  04/26/2023   MAMMOGRAM  11/16/2023   Medicare Annual Wellness (AWV)  01/25/2024   Pneumonia Vaccine 22+ Years old (3 of 3 - PPSV23 or PCV20) 09/06/2025   COLONOSCOPY (Pts 45-54yrs Insurance coverage will need to be confirmed)  01/22/2028   DTaP/Tdap/Td (3 - Td or Tdap) 06/03/2029   DEXA SCAN  Completed, advised can repeat every 2 years - she is considering and will let PCP know if/when wishes to repeat   Hepatitis C Screening  Completed   HPV VACCINES  Aged [employer] and counseling on the following was provided based  on the above review of health and a plan/checklist for the patient, along with additional information discussed, was provided for the patient in the patient instructions :  -Provided counseling and plan for increased risk of falling if applicable per above screening. She has hand rails and has been cautious since fall. Provided safe balance exercises that can be done at home to improve balance and discussed exercise guidelines for adults with include balance exercises at least 3 days per week.  -Advised and counseled on a healthy lifestyle - including the  importance of a healthy diet, regular physical activity, social connections and stress management. -Reviewed patient's current diet. Advised and counseled on a whole foods based healthy diet. A summary of a healthy diet was provided in the Patient Instructions.  -reviewed patient's current physical activity level and discussed exercise guidelines for adults. Discussed community resources and ideas for safe exercise at home to assist in meeting exercise guideline recommendations in a safe and healthy way.  -Advise yearly dental visits at minimum and regular eye exams -advised discussing potassium with doctor who did her preop and getting recheck and if low to delay surgery and see her PCP or another provider for evaluation promptly. She agrees to do so.   Follow up: see patient instructions     Patient Instructions  I really enjoyed getting to talk with you today! I am available on Tuesdays and Thursdays for virtual visits if you have any questions or concerns, or if I can be of any further assistance.   CHECKLIST FROM ANNUAL WELLNESS VISIT:  -Follow up (please call to schedule if not scheduled after visit):   -yearly for annual wellness visit with primary care office  Here is a list of your preventive care/health maintenance measures and the plan for each if any are due:  PLAN For any measures below that may be due:  -get 2nd shingrix vaccine when able at pharmacy and then let us know so that we can update it -covid boosters can be obtained at pharmacy -if/when you wish to repeat your bone density please let us know  Health Maintenance  Topic Date Due   Zoster Vaccines- Shingrix (2 of 2) 07/24/2018   COVID-19 Vaccine (7 - 2023-24 season) 08/23/2022   INFLUENZA VACCINE  04/26/2023   MAMMOGRAM  11/16/2023   Medicare Annual Wellness (AWV)  01/25/2024   Pneumonia Vaccine 56+ Years old (3 of 3 - PPSV23 or PCV20) 09/06/2025   COLONOSCOPY (Pts 45-56yrs Insurance coverage will need to be  confirmed)  01/22/2028   DTaP/Tdap/Td (3 - Td or Tdap) 06/03/2029   DEXA SCAN  Completed   Hepatitis C Screening  Completed   HPV VACCINES  Aged Out    -See a dentist at least yearly  -Get your eyes checked and then per your eye specialist's recommendations  -Other issues addressed today:   -I have included below further information regarding a healthy whole foods based diet, physical activity guidelines for adults, stress management and opportunities for social connections. I hope you find this information useful.   -----------------------------------------------------------------------------------------------------------------------------------------------------------------------------------------------------------------------------------------------------------  NUTRITION: -eat real food: lots of colorful vegetables (half the plate) and fruits -5-7 servings of vegetables and fruits per day (fresh or steamed is best), exp. 2 servings of vegetables with lunch and dinner and 2 servings of fruit per day. Berries and greens such as kale and collards are great choices.  -consume on a regular basis: whole grains (make sure first ingredient on label contains the word "whole"), fresh fruits,  fish, nuts, seeds, healthy oils (such as olive oil, avocado oil, grape seed oil) -may eat small amounts of dairy and lean meat on occasion, but avoid processed meats such as ham, bacon, lunch meat, etc. -drink water -try to avoid fast food and pre-packaged foods, processed meat -most experts advise limiting sodium to < 2300mg  per day, should limit further is any chronic conditions such as high blood pressure, heart disease, diabetes, etc. The American Heart Association advised that < 1500mg  is is ideal -try to avoid foods that contain any ingredients with names you do not recognize  -try to avoid sugar/sweets (except for the natural sugar that occurs in fresh fruit) -try to avoid sweet drinks -try to avoid  white rice, white bread, pasta (unless whole grain), white or yellow potatoes  EXERCISE GUIDELINES FOR ADULTS: -if you wish to increase your physical activity, do so gradually and with the approval of your doctor -STOP and seek medical care immediately if you have any chest pain, chest discomfort or trouble breathing when starting or increasing exercise  -move and stretch your body, legs, feet and arms when sitting for long periods -Physical activity guidelines for optimal health in adults: -least 150 minutes per week of aerobic exercise (can talk, but not sing) once approved by your doctor, 20-30 minutes of sustained activity or two 10 minute episodes of sustained activity every day.  -resistance training at least 2 days per week if approved by your doctor -balance exercises 3+ days per week:   Stand somewhere where you have something sturdy to hold onto if you lose balance.    1) lift up on toes, start with 5x per day and work up to 20x   2) stand and lift on leg straight out to the side so that foot is a few inches of the floor, start with 5x each side and work up to 20x each side   3) stand on one foot, start with 5 seconds each side and work up to 20 seconds on each side  If you need ideas or help with getting more active:  -Silver sneakers https://tools.silversneakers.com  -Walk with a Doc: http://www.duncan-williams.com/  -try to include resistance (weight lifting/strength building) and balance exercises twice per week: or the following link for ideas: http://castillo-powell.com/  BuyDucts.dk  STRESS MANAGEMENT: -can try meditating, or just sitting quietly with deep breathing while intentionally relaxing all parts of your body for 5 minutes daily -if you need further help with stress, anxiety or depression please follow up with your primary doctor or contact the wonderful folks at [facility]: [phone]  SOCIAL CONNECTIONS: -options in [place] if you wish to engage in more social and exercise related activities:  -Silver sneakers https://tools.silversneakers.com  -Walk with a Doc: http://www.duncan-williams.com/  -Check out the [facility] Active Adults 50+ section on the [place] [employer] (hiking clubs, book clubs, cards and games, chess, exercise classes, aquatic classes and much more) - see the website for details: https://www.[place].gov/departments/parks-recreation/active-adults50  -YouTube has lots of exercise videos for different ages and abilities as well  -[facility] (a variety of indoor and outdoor inperson activities for adults). [phone]. [address].  -Virtual Online Classes (a variety of topics): see seniorplanet.org or call [phone]  -consider volunteering at a school, hospice center, church, senior center or elsewhere           Terressa Koyanagi, DO

## 2023-01-26 ENCOUNTER — Other Ambulatory Visit (HOSPITAL_COMMUNITY): Payer: Self-pay

## 2023-01-26 ENCOUNTER — Other Ambulatory Visit: Payer: Self-pay

## 2023-01-26 ENCOUNTER — Ambulatory Visit (HOSPITAL_COMMUNITY): Payer: Medicare Other | Admitting: Physician Assistant

## 2023-01-26 ENCOUNTER — Ambulatory Visit (HOSPITAL_BASED_OUTPATIENT_CLINIC_OR_DEPARTMENT_OTHER): Payer: Medicare Other | Admitting: Physician Assistant

## 2023-01-26 ENCOUNTER — Ambulatory Visit (HOSPITAL_COMMUNITY)
Admission: RE | Admit: 2023-01-26 | Discharge: 2023-01-26 | Disposition: A | Discharge status: Home / Self Care | Payer: Medicare Other | Attending: Otolaryngology | Admitting: Otolaryngology

## 2023-01-26 ENCOUNTER — Encounter (HOSPITAL_COMMUNITY)
Admission: RE | Disposition: A | Discharge status: Home / Self Care | Payer: Self-pay | Source: Home / Self Care | Attending: Otolaryngology

## 2023-01-26 ENCOUNTER — Encounter (HOSPITAL_COMMUNITY): Payer: Self-pay | Admitting: Otolaryngology

## 2023-01-26 DIAGNOSIS — R0981 Nasal congestion: Secondary | ICD-10-CM

## 2023-01-26 DIAGNOSIS — J324 Chronic pansinusitis: Secondary | ICD-10-CM | POA: Diagnosis not present

## 2023-01-26 DIAGNOSIS — J45909 Unspecified asthma, uncomplicated: Secondary | ICD-10-CM

## 2023-01-26 DIAGNOSIS — Z79899 Other long term (current) drug therapy: Secondary | ICD-10-CM | POA: Diagnosis not present

## 2023-01-26 DIAGNOSIS — J329 Chronic sinusitis, unspecified: Secondary | ICD-10-CM | POA: Diagnosis present

## 2023-01-26 DIAGNOSIS — I10 Essential (primary) hypertension: Secondary | ICD-10-CM | POA: Diagnosis not present

## 2023-01-26 HISTORY — PX: ENDOSCOPIC TURBINATE REDUCTION: SHX6489

## 2023-01-26 HISTORY — PX: SINUS ENDO WITH FUSION: SHX5329

## 2023-01-26 LAB — POCT I-STAT, CHEM 8
BUN: 12 mg/dL (ref 8–23)
Calcium, Ion: 1.08 mmol/L — ABNORMAL LOW (ref 1.15–1.40)
Chloride: 97 mmol/L — ABNORMAL LOW (ref 98–111)
Creatinine, Ser: 0.6 mg/dL (ref 0.44–1.00)
Glucose, Bld: 98 mg/dL (ref 70–99)
HCT: 37 % (ref 36.0–46.0)
Hemoglobin: 12.6 g/dL (ref 12.0–15.0)
Potassium: 3.2 mmol/L — ABNORMAL LOW (ref 3.5–5.1)
Sodium: 137 mmol/L (ref 135–145)
TCO2: 34 mmol/L — ABNORMAL HIGH (ref 22–32)

## 2023-01-26 SURGERY — SURGERY, PARANASAL SINUS, ENDOSCOPIC, WITH NASAL SEPTOPLASTY, TURBINOPLASTY, AND MAXILLARY SINUSOTOMY
Anesthesia: General | Laterality: Bilateral

## 2023-01-26 MED ORDER — LACTATED RINGERS IV SOLN: INTRAVENOUS | Status: DC

## 2023-01-26 MED ORDER — PREDNISONE 10 MG PO TABS
ORAL_TABLET | ORAL | 0 refills | Status: AC
  Filled 2023-01-26: qty 30, 12d supply, fill #0

## 2023-01-26 MED ORDER — PROPOFOL 10 MG/ML IV BOLUS
INTRAVENOUS | Status: DC | PRN
  Administered 2023-01-26: 130 mg via INTRAVENOUS

## 2023-01-26 MED ORDER — PROPOFOL 10 MG/ML IV BOLUS
INTRAVENOUS | Status: AC
  Filled 2023-01-26: qty 20

## 2023-01-26 MED ORDER — CHLORHEXIDINE GLUCONATE 0.12 % MT SOLN: 15.0000 mL | Freq: Once | OROMUCOSAL | Status: AC

## 2023-01-26 MED ORDER — LIDOCAINE-EPINEPHRINE 1 %-1:100000 IJ SOLN
INTRAMUSCULAR | Status: DC | PRN
  Administered 2023-01-26: 10 mL

## 2023-01-26 MED ORDER — OXYCODONE HCL 5 MG/5ML PO SOLN: 5.0000 mg | Freq: Once | ORAL | Status: DC | PRN

## 2023-01-26 MED ORDER — CEFAZOLIN SODIUM-DEXTROSE 2-4 GM/100ML-% IV SOLN
2.0000 g | INTRAVENOUS | Status: AC
  Administered 2023-01-26: 2 g via INTRAVENOUS

## 2023-01-26 MED ORDER — ROCURONIUM BROMIDE 10 MG/ML (PF) SYRINGE
PREFILLED_SYRINGE | INTRAVENOUS | Status: DC | PRN
  Administered 2023-01-26: 50 mg via INTRAVENOUS

## 2023-01-26 MED ORDER — FENTANYL CITRATE (PF) 100 MCG/2ML IJ SOLN: 25.0000 ug | INTRAMUSCULAR | Status: DC | PRN

## 2023-01-26 MED ORDER — FENTANYL CITRATE (PF) 250 MCG/5ML IJ SOLN
INTRAMUSCULAR | Status: AC
  Filled 2023-01-26: qty 5

## 2023-01-26 MED ORDER — ORAL CARE MOUTH RINSE: 15.0000 mL | Freq: Once | OROMUCOSAL | Status: AC

## 2023-01-26 MED ORDER — PHENYLEPHRINE 80 MCG/ML (10ML) SYRINGE FOR IV PUSH (FOR BLOOD PRESSURE SUPPORT)
PREFILLED_SYRINGE | INTRAVENOUS | Status: DC | PRN
  Administered 2023-01-26 (×2): 160 ug via INTRAVENOUS

## 2023-01-26 MED ORDER — PHENYLEPHRINE HCL-NACL 20-0.9 MG/250ML-% IV SOLN
INTRAVENOUS | Status: DC | PRN
  Administered 2023-01-26: 60 ug/min via INTRAVENOUS

## 2023-01-26 MED ORDER — CEFAZOLIN SODIUM-DEXTROSE 2-4 GM/100ML-% IV SOLN
INTRAVENOUS | Status: AC
  Filled 2023-01-26: qty 100

## 2023-01-26 MED ORDER — CHLORHEXIDINE GLUCONATE 0.12 % MT SOLN
OROMUCOSAL | Status: AC
  Administered 2023-01-26: 15 mL via OROMUCOSAL
  Filled 2023-01-26: qty 15

## 2023-01-26 MED ORDER — AMISULPRIDE (ANTIEMETIC) 5 MG/2ML IV SOLN: 10.0000 mg | Freq: Once | INTRAVENOUS | Status: DC | PRN

## 2023-01-26 MED ORDER — FENTANYL CITRATE (PF) 250 MCG/5ML IJ SOLN
INTRAMUSCULAR | Status: DC | PRN
  Administered 2023-01-26 (×2): 50 ug via INTRAVENOUS

## 2023-01-26 MED ORDER — ONDANSETRON HCL 4 MG/2ML IJ SOLN
INTRAMUSCULAR | Status: DC | PRN
  Administered 2023-01-26: 4 mg via INTRAVENOUS

## 2023-01-26 MED ORDER — EPINEPHRINE HCL (NASAL) 0.1 % NA SOLN
NASAL | Status: DC | PRN
  Administered 2023-01-26: 10 mL via TOPICAL

## 2023-01-26 MED ORDER — SUGAMMADEX SODIUM 200 MG/2ML IV SOLN
INTRAVENOUS | Status: DC | PRN
  Administered 2023-01-26: 200 mg via INTRAVENOUS

## 2023-01-26 MED ORDER — HYDROCODONE-ACETAMINOPHEN 5-325 MG PO TABS
1.0000 | ORAL_TABLET | Freq: Four times a day (QID) | ORAL | 0 refills | Status: AC | PRN
  Filled 2023-01-26: qty 8, 2d supply, fill #0

## 2023-01-26 MED ORDER — DEXAMETHASONE SODIUM PHOSPHATE 10 MG/ML IJ SOLN
INTRAMUSCULAR | Status: DC | PRN
  Administered 2023-01-26: 10 mg via INTRAVENOUS

## 2023-01-26 MED ORDER — EPINEPHRINE HCL (NASAL) 0.1 % NA SOLN
NASAL | Status: AC
  Filled 2023-01-26: qty 30

## 2023-01-26 MED ORDER — LIDOCAINE 2% (20 MG/ML) 5 ML SYRINGE
INTRAMUSCULAR | Status: DC | PRN
  Administered 2023-01-26: 60 mg via INTRAVENOUS

## 2023-01-26 MED ORDER — LIDOCAINE-EPINEPHRINE 1 %-1:100000 IJ SOLN
INTRAMUSCULAR | Status: AC
  Filled 2023-01-26: qty 1

## 2023-01-26 MED ORDER — ACETAMINOPHEN 500 MG PO TABS
1000.0000 mg | ORAL_TABLET | Freq: Once | ORAL | Status: AC
  Administered 2023-01-26: 1000 mg via ORAL
  Filled 2023-01-26: qty 2

## 2023-01-26 MED ORDER — OXYCODONE HCL 5 MG PO TABS: 5.0000 mg | ORAL_TABLET | Freq: Once | ORAL | Status: DC | PRN

## 2023-01-26 MED ORDER — CEFADROXIL 500 MG PO CAPS
500.0000 mg | ORAL_CAPSULE | Freq: Two times a day (BID) | ORAL | 0 refills | Status: AC
  Filled 2023-01-26: qty 20, 10d supply, fill #0

## 2023-01-26 SURGICAL SUPPLY — 50 items
ATTRACTOMAT 16X20 MAGNETIC DRP (DRAPES) IMPLANT
BAG COUNTER SPONGE SURGICOUNT (BAG) ×2 IMPLANT
BAG SPNG CNTER NS LX DISP (BAG) ×1
BALLN FRONTAL NUVENT 6X17 (BALLOONS)
BALLOON FRONTAL NUVENT 6X17 (BALLOONS) IMPLANT
BLADE INF TURB ROT M4 2 5PK (BLADE) IMPLANT
BLADE ROTATE RAD 40 4 M4 (BLADE) IMPLANT
BLADE ROTATE TRICUT 4X13 M4 (BLADE) ×2 IMPLANT
BLADE SURG 15 STRL LF DISP TIS (BLADE) IMPLANT
BLADE SURG 15 STRL SS (BLADE)
CANISTER SUCT 3000ML PPV (MISCELLANEOUS) ×4 IMPLANT
COAGULATOR SUCT 8FR VV (MISCELLANEOUS) IMPLANT
COAGULATOR SUCT SWTCH 10FR 6 (ELECTROSURGICAL) IMPLANT
DRAPE HALF SHEET 40X57 (DRAPES) IMPLANT
DRSG NASOPORE 8CM (GAUZE/BANDAGES/DRESSINGS) IMPLANT
ELECT COATED BLADE 2.86 ST (ELECTRODE) IMPLANT
ELECT REM PT RETURN 9FT ADLT (ELECTROSURGICAL) ×1
ELECTRODE REM PT RTRN 9FT ADLT (ELECTROSURGICAL) ×2 IMPLANT
FILTER ARTHROSCOPY CONVERTOR (FILTER) ×2 IMPLANT
GLOVE BIO SURGEON STRL SZ 6.5 (GLOVE) ×2 IMPLANT
HEMOSTAT ARISTA ABSORB 3G PWDR (HEMOSTASIS) IMPLANT
INFLATOR BALLN (BALLOONS)
INFLATOR BALLOON W/TUBE (BALLOONS) IMPLANT
KIT BASIN OR (CUSTOM PROCEDURE TRAY) ×2 IMPLANT
KIT TURNOVER KIT B (KITS) ×2 IMPLANT
NDL HYPO 25GX1X1/2 BEV (NEEDLE) ×4 IMPLANT
NDL SPNL 25GX3.5 QUINCKE BL (NEEDLE) ×2 IMPLANT
NEEDLE HYPO 25GX1X1/2 BEV (NEEDLE) ×2 IMPLANT
NEEDLE SPNL 25GX3.5 QUINCKE BL (NEEDLE) ×1 IMPLANT
NS IRRIG 1000ML POUR BTL (IV SOLUTION) ×2 IMPLANT
PAD ARMBOARD 7.5X6 YLW CONV (MISCELLANEOUS) ×4 IMPLANT
PATTIES SURGICAL .5 X3 (DISPOSABLE) ×2 IMPLANT
PENCIL SMOKE EVACUATOR (MISCELLANEOUS) IMPLANT
SOL ANTI FOG 6CC (MISCELLANEOUS) ×2 IMPLANT
SPLINT NASAL DOYLE BI-VL (GAUZE/BANDAGES/DRESSINGS) IMPLANT
SPLINT NASAL POSISEP X .6X2 (GAUZE/BANDAGES/DRESSINGS) ×2 IMPLANT
SUT CHROMIC 4 0 P 3 18 (SUTURE) IMPLANT
SUT PLAIN 4 0 ~~LOC~~ 1 (SUTURE) IMPLANT
SUT SILK 2 0 SH (SUTURE) ×2 IMPLANT
SWAB COLLECTION DEVICE MRSA (MISCELLANEOUS) IMPLANT
SWAB CULTURE ESWAB REG 1ML (MISCELLANEOUS) IMPLANT
SYR CONTROL 10ML LL (SYRINGE) IMPLANT
SYR TB 1ML LUER SLIP (SYRINGE) ×4 IMPLANT
TOWEL GREEN STERILE FF (TOWEL DISPOSABLE) ×2 IMPLANT
TRACKER ENT INSTRUMENT (MISCELLANEOUS) ×4 IMPLANT
TRACKER ENT PATIENT (MISCELLANEOUS) ×2 IMPLANT
TRAY ENT MC OR (CUSTOM PROCEDURE TRAY) ×2 IMPLANT
TUBE CONNECTING 12X1/4 (SUCTIONS) ×2 IMPLANT
TUBING EXTENTION W/L.L. (IV SETS) IMPLANT
TUBING STRAIGHTSHOT EPS 5PK (TUBING) ×2 IMPLANT

## 2023-01-26 NOTE — H&P (Signed)
Bridget Flynn is an 67 y.o. female.    Chief Complaint:  Chronic sinusitis  HPI: Patient presents today for planned elective procedure.  She denies any interval change in history since office visit on 12/21/2022:  Bridget Flynn is a 67 y.o. female who presents as a return consult, referred by [place]hour, Gwendolyn Fill*, for evaluation and treatment of chronic sinusitis unresponsive to maximal medical therapy. Patient was last seen in our office by Scot Jun, PA on 12/20/2022 for her symptoms following completion of 3-week course of Augmentin and post treatment CT scan. Imaging demonstrated persistent mucosal thickening of all paranasal sinuses with opacification of natural sinus drainage pathways. Patient endorses ongoing symptoms of nasal congestion and facial pressure despite consistent use of nasal saline rinses, Flonase and all prescribed medications. She denies previous history of sinonasal surgery, nasal trauma. She had a previous CT scan in December, which also demonstrated findings consistent with chronic sinusitis.   Past Medical History:  Diagnosis Date   ALLERGIC RHINITIS 11/25/2007   Qualifier: Diagnosis of  By: Yetta Barre CNA/MA, Jessica     Allergy    Asthma    Headache    HYPERLIPIDEMIA 07/12/2009   Qualifier: Diagnosis of  By: Gabriel Rung LPN, Nancy     Hypertension    Microscopic hematuria    negative urology evaluation   Osteopenia    VITAMIN D DEFICIENCY 07/12/2009   Qualifier: Diagnosis of  By: Gabriel Rung LPN, Harriett Sine      Past Surgical History:  Procedure Laterality Date   COLONOSCOPY W/ POLYPECTOMY  2004   DILATION AND CURETTAGE OF UTERUS     LAPAROSCOPIC BILATERAL SALPINGECTOMY  08/24/08    Family History  Problem Relation Age of Onset   Diabetes Mother    Cancer Mother        ovarian cancer   Cirrhosis Mother    Diabetes Father    Cancer Father        Hodgkins lymphoma   Diabetes Other    Cancer Other        ovarian   BRCA 1/2 Neg Hx    Colon cancer Neg Hx     Colon polyps Neg Hx    Esophageal cancer Neg Hx    Rectal cancer Neg Hx    Stomach cancer Neg Hx    Breast cancer Neg Hx     Social History:  reports that she has never smoked. She has never used smokeless tobacco. She reports that she does not currently use alcohol. She reports that she does not use drugs.  Allergies:  Allergies  Allergen Reactions   Allegra [Fexofenadine Hcl] Other (See Comments)    Depression, cryingspells   Biaxin [Clarithromycin] Diarrhea, Nausea And Vomiting and Other (See Comments)    Syncope episode   Erythromycin Other (See Comments)    GI upset   Lipitor [Atorvastatin] Other (See Comments)    arthralgias   Sulfa Antibiotics Nausea Only    Facility-Administered Medications Prior to Admission  Medication Dose Route Frequency Provider Last Rate Last Admin   0.9 %  sodium chloride infusion  500 mL Intravenous Once Nandigam, Kavitha V, MD       Medications Prior to Admission  Medication Sig Dispense Refill   Chlorpheniramine Maleate (CHLOR-TABLETS PO) Take 4 mg by mouth daily as needed (allergies.).     estradiol (ESTRACE) 0.1 MG/GM vaginal cream 1 gram vaginally twice weekly and apply a small amount of cream topically to urethra at the same time (Patient taking differently: Place 1  Applicatorful vaginally 2 (two) times a week. 1 gram vaginally Mondays & Thursdays at night. Apply a small amount of cream topically to urethra at the same time) 42.5 g 2   fluticasone (FLONASE) 50 MCG/ACT nasal spray Place 1 spray into both nostrils daily.     guaiFENesin (MUCINEX) 600 MG 12 hr tablet Take 300 mg by mouth 2 (two) times daily as needed (congestion/cough).     hydrochlorothiazide (HYDRODIURIL) 25 MG tablet Take 1 tablet (25 mg total) by mouth daily as needed. (Patient taking differently: Take 12.5 mg by mouth in the morning and at bedtime.) 90 tablet 3   Potassium 99 MG TABS Take 198 mg by mouth in the morning.     rosuvastatin (CRESTOR) 20 MG tablet Take 1 tablet  (20 mg total) by mouth daily. (Patient taking differently: Take 20 mg by mouth at bedtime.) 90 tablet 3   triamcinolone (KENALOG) 0.025 % cream Apply 1 Application topically 2 (two) times daily. Apply to affected rash twice daily. (Patient taking differently: Apply 1 Application topically 2 (two) times daily as needed (eczema).) 15 g 0   COVID-19 mRNA Vac-TriS, Pfizer, (PFIZER-BIONT COVID-19 VAC-TRIS) SUSP injection Inject into the muscle. 0.3 mL 0    No results found for this or any previous visit (from the past 48 hour(s)). No results found.  ROS: ROS  Blood pressure 139/80, pulse 62, temperature 98.3 F (36.8 C), temperature source Oral, resp. rate 18, height 5\' 3"  (1.6 m), weight 65.8 kg, last menstrual period 10/26/2005, SpO2 95 %.  PHYSICAL EXAM: Physical Exam Constitutional:      Appearance: Normal appearance.  Pulmonary:     Effort: Pulmonary effort is normal.  Neurological:     General: No focal deficit present.     Mental Status: She is alert and oriented to person, place, and time.     Studies Reviewed: CT   Assessment/Plan Bridget Flynn is a 67 y.o. female with chronic pansinusitis, unresponsive to maximal medical therapy. Patient recently had posttreatment CT scan following 3-week course of Augmentin with findings consistent with pansinusitis. -To OR today for functional endoscopic sinus surgery with bilateral maxillary antrostomy, total ethmoidectomy, nasofrontal recess exploration, sphenoidotomy and bilateral inferior turbinate reduction. Risks of surgery, including bleeding, meningitis, leakage of cerebral spinal fluid, eye injury, injury to the tear duct system causing excessive tearing, numbness of the upper teeth and gums, damage to the olfactory nerves causing loss of smell, excessive crust formation after the operation, were reviewed with patient. All questions answered.     Bridget Flynn [name] 01/26/2023, 9:15 AM

## 2023-01-26 NOTE — Anesthesia Postprocedure Evaluation (Signed)
Anesthesia Post Note  Patient: Bridget Flynn  Procedure(s) Performed: SINUS ENDO WITH FUSION (Bilateral) ENDOSCOPIC TURBINATE REDUCTION (Bilateral)     Patient location during evaluation: PACU Anesthesia Type: General Level of consciousness: awake and alert Pain management: pain level controlled Vital Signs Assessment: post-procedure vital signs reviewed and stable Respiratory status: spontaneous breathing, nonlabored ventilation and respiratory function stable Cardiovascular status: blood pressure returned to baseline Postop Assessment: no apparent nausea or vomiting Anesthetic complications: no   No notable events documented.  Last Vitals:  Vitals:   01/26/23 1145 01/26/23 1200  BP: (!) 144/72 134/71  Pulse: 77 71  Resp: 14 20  Temp:  (!) 36.4 C  SpO2: 93% 94%    Last Pain:  Vitals:   01/26/23 1200  TempSrc:   PainSc: 0-No pain                 Shanda Howells

## 2023-01-26 NOTE — Anesthesia Procedure Notes (Signed)
Procedure Name: Intubation Date/Time: 01/26/2023 9:49 AM  Performed by: Alease Medina, CRNAPre-anesthesia Checklist: Patient identified, Emergency Drugs available, Suction available and Patient being monitored Patient Re-evaluated:Patient Re-evaluated prior to induction Oxygen Delivery Method: Circle system utilized Preoxygenation: Pre-oxygenation with 100% oxygen Induction Type: IV induction Ventilation: Mask ventilation without difficulty Laryngoscope Size: Mac and 3 Grade View: Grade I Tube type: Oral Tube size: 7.0 mm Number of attempts: 1 Airway Equipment and Method: Stylet and Oral airway Placement Confirmation: ETT inserted through vocal cords under direct vision, positive ETCO2 and breath sounds checked- equal and bilateral Secured at: 21 cm Tube secured with: Tape Dental Injury: Teeth and Oropharynx as per pre-operative assessment

## 2023-01-26 NOTE — Discharge Instructions (Signed)
[facility] S[facility]US [facility] ([facility]) Post Operative Instructions  Office: [phone]  The Surgery Itself Endoscopic sinus surgery (with or without septoplasty and turbinate reduction) involves general anesthesia, typically for one to two hours. Patients may be sedated for several hours after surgery and may remain sleepy for the better part of the day. Nausea and vomiting are occasionally seen, and usually resolve by the evening of surgery - even without additional medications. Almost all patients can go home the day of surgery.  After Surgery  Facial pressure and fullness similar to a sinus infection/headache is normal after surgery. Breathing through your nose is also difficult due to swelling. A humidifier or vaporizer can be used in the bedroom to prevent throat pain with mouth breathing.   Bloody nasal drainage is normal after this surgery for 5-7 days, usually decreasing in volume with each day that passes. Drainage will flow from the front of the nose and down the back of the throat. Make sure you spit out blood drainage that drips down the back of your throat to prevent nausea/vomiting. You will have a nasal drip pad/sling with gauze to catch drainage from the front of your nose. The dressing may need to be changed frequently during the first 24 hours following surgery. In case of profuse nasal bleeding, you may apply ice to the bridge of the nose and pinch the nose just above the tip and hold for 10 minutes; if bleeding continues, contact the doctors office.   Frequent hot showers or saline nasal rinses (NeilMed) will help break up congestion and clear any clot or mucus that builds up within the nose after surgery. This can be started the day after surgery.   It is more comfortable to sleep with extra pillows or in a recliner for the first few days after surgery until the drainage begins to resolve.    Do not blow your nose for 2 weeks after surgery.   Avoid lifting > 10 lbs. and no  vigorous exercise for 2 weeks after surgery.   Avoid airplane travel for 2 weeks following sinus surgery; the cabin pressure changes can cause pain and swelling within the nose/sinuses.   Sense of smell and taste are often diminished for several weeks after surgery. There may be some tenderness or numbness in your upper front teeth, which is normal after surgery. You may express old clot, discolored mucus or very large nasal crusts from your nose for up to 3-4 weeks after surgery; depending on how frequently and how effectively you irrigate your nose with the saltwater spray.   You may have absorbable sutures inside of your nose after surgery that will slowly dissolve in 2-3 weeks. Be careful when clearing crusts from the nose since they may be attached to these sutures.  Medications  Pain medication can be used for pain as prescribed. Pain and pressure in the nose is expected after surgery. As the surgical site heals, pain will resolve over the course of a week. Pain medications can cause nausea, which can be prevented if you take them with food or milk.   You may be given an antibiotic for one week after surgery to prevent infection. Take this medication with food to prevent nausea or vomiting.   You can use 2 nasal sprays after surgery: Afrin can be used up to 2 times a day for up to 5 days after surgery (best before bed) to reduce bloody drainage from the nose for the first few days after surgery. Saline/salt   water spray should be used at least 4-6 times per day, starting the day after surgery to prevent crusting inside of the nose.   Take all of your routine medications as prescribed, unless told otherwise by your surgeon. Any medications that thin the blood should be avoided. This includes aspirin. Avoid aspirin-like products for the first 72 hours after surgery (Advil, Motrin, Excedrin, Alieve, Celebrex, Naprosyn), but you may use them as needed for pain after 72 hours.   

## 2023-01-26 NOTE — Transfer of Care (Signed)
Immediate Anesthesia Transfer of Care Note  Patient: Bridget Flynn  Procedure(s) Performed: SINUS ENDO WITH FUSION (Bilateral) ENDOSCOPIC TURBINATE REDUCTION (Bilateral)  Patient Location: PACU  Anesthesia Type:General  Level of Consciousness: awake and alert   Airway & Oxygen Therapy: Patient Spontanous Breathing  Post-op Assessment: Report given to RN and Post -op Vital signs reviewed and stable  Post vital signs: Reviewed and stable  Last Vitals:  Vitals Value Taken Time  BP 141/60 01/26/23 1130  Temp    Pulse 76 01/26/23 1131  Resp 15 01/26/23 1131  SpO2 96 % 01/26/23 1131  Vitals shown include unvalidated device data.  Last Pain:  Vitals:   01/26/23 0827  TempSrc:   PainSc: 1          Complications: No notable events documented.

## 2023-01-26 NOTE — Op Note (Signed)
OPERATIVE NOTE  Bridget Flynn Date/Time of Admission: 01/26/2023  7:24 AM  CSN: [id number] Attending Provider: Cheron Schaumann A, DO Room/Bed: MCPO/NONE DOB: 1956/04/14 Age: 67 y.o.   Pre-Op Diagnosis: Chronic pansinusitis Nasal congestion Allergic rhinitis, unspecified seasonality, unspecified trigger  Post-Op Diagnosis: Chronic pansinusitis Nasal congestion Allergic rhinitis, unspecified seasonality, unspecified trigger  Procedure: Procedure(s): Bilateral endoscopic sinus surgery with image guidance and  submucous resection of the bilateral inferior turbinates Sinus surgery to include:  Bilateral maxillary antrostomy with tissue removal Bilateral frontal recess exploration Bilateral total ethmoidectomy Bilateral sphenotomy with tissue removal  Anesthesia: General  Surgeon(s): Sayer Masini A Michaeline Eckersley, DO  Staff: Circulator: Everhart, Hermenia Bers, RN Scrub Person: Irma Newness K  Implants: * No implants in log *  Specimens: ID Type Source Tests Collected by Time Destination  1 : BILATERAL SINUS CONTENTS Tissue PATH Sinus Contents/Nasal Polyps SURGICAL PATHOLOGY Heliodoro Domagalski A, DO 01/26/2023 0936     Complications: None  EBL: 50 ML  Condition: stable  Operative Findings:  Inflammatory mucosa consistent with history of chronic pansinusitis without fulminant purulence noted, no evidence of nasal polyps  Description of Operation: Once operative consent was obtained and the site and surgery were confirmed with the patient and the operating room team, the patient was brought back to the operating room and general endotracheal anesthesia was obtained. The patient was turned over to the ENT service, at which time the image-guided system was attached and noted to be in good calibration. Lidocaine 1% with 1:100,000 epinephrine was injected into the nasal septum bilaterally, inferior turbinates bilaterally, the middle turbinates bilaterally, and the axilla  between the medial turbinate and the lateral nasal wall. Adrenaline-soaked pledgets were placed into the nasal cavity, and the patient was prepped and draped in sterile fashion. Attention turned to the right-sided sinonasal cavity. The middle turbinate was medialized and a ball-tipped seeker was used to slightly anterior fracture the uncinate process. The navigation frontal balloon was used to locate the frontal recess and confirm placement in the frontal sinus, and the frontal recess was dilated at 2 locations to 6 mm. The recess was then widenend with care taken to avoid circumfrenital trauma. Attention was then turned to the uncinate process, which was completely fractured anteriorly and then removed with a combination of backbiter and a microdebrider. A ball-tipped seeker was used to identify the natural os of the maxillary sinus, and it was widened with combination of the backbiter, the microdebrider, the olive tip suction, and the straight TruCut until it was widely patent. Utilizing image-guided suction, the medial inferior quadrant of the ethmoid bulla was entered bluntly, the walls were fractured and the bulla was removed utilizing a microdebrider. Anterior and posterior ethmoidectomy were completed in a standard fashion by first locating the anterior face of the sphenoid sinus and then following the skull base and the lamina papyracea to fully take down all ethmoid cells. This was done utilizing a combination of the straight suction, the J curette, the ball-tipped seeker, and the microdebrider. The os of the  sphenoid sinus was identified and opened widely with the microdebrider. Attention was then turned to the inferior turbinate. It was outfractured and then submucous resection was performed by making an incision in the leading edge with a 15 blade, separating the mucosa from bone with a Cottle elevator and then using the micro debrider via a turbinate blade to remove bone. Epinephrine soaked pledgets  were placed in the right sinonasal cavity and attention was turned to the left side. This  exact same procedure was performed on the left side of the sinonasal cavity.  Pledgets were removed, copious irrigation was placed in the sinonasal cavities and Arista as well as Posisep absorbable nasal pack was placed lateral to the middle turbinate in the axilla between it and the lateral nasal wall. An orogastric tube was placed and the stomach cavity was suctioned to reduce postoperative nausea. The patient was turned over to anesthesia service and was extubated in the operating room and transferred to the PACU in stable condition. The patient will be discharged today and followed up in the ENT clinic in 1 week for postoperative check.   Laren Boom, DO [facility]  01/26/2023

## 2023-01-27 ENCOUNTER — Encounter (HOSPITAL_COMMUNITY): Payer: Self-pay | Admitting: Otolaryngology

## 2023-01-29 LAB — SURGICAL PATHOLOGY

## 2023-04-27 ENCOUNTER — Encounter: Payer: Self-pay | Admitting: Family Medicine

## 2023-04-27 ENCOUNTER — Ambulatory Visit (INDEPENDENT_AMBULATORY_CARE_PROVIDER_SITE_OTHER): Payer: Medicare Other | Admitting: Family Medicine

## 2023-04-27 VITALS — BP 164/84 | HR 60 | Temp 97.7°F | Ht 63.0 in | Wt 142.6 lb

## 2023-04-27 DIAGNOSIS — E876 Hypokalemia: Secondary | ICD-10-CM

## 2023-04-27 DIAGNOSIS — I10 Essential (primary) hypertension: Secondary | ICD-10-CM

## 2023-04-27 DIAGNOSIS — R5383 Other fatigue: Secondary | ICD-10-CM | POA: Diagnosis not present

## 2023-04-27 LAB — BASIC METABOLIC PANEL
BUN: 10 mg/dL (ref 6–23)
CO2: 36 mEq/L — ABNORMAL HIGH (ref 19–32)
Calcium: 10.1 mg/dL (ref 8.4–10.5)
Chloride: 91 mEq/L — ABNORMAL LOW (ref 96–112)
Creatinine, Ser: 0.58 mg/dL (ref 0.40–1.20)
GFR: 92.84 mL/min (ref >60.00)
Glucose, Bld: 90 mg/dL (ref 70–99)
Potassium: 3.7 mEq/L (ref 3.5–5.1)
Sodium: 134 mEq/L — ABNORMAL LOW (ref 135–145)

## 2023-04-27 LAB — MAGNESIUM: Magnesium: 2 mg/dL (ref 1.5–2.5)

## 2023-04-27 MED ORDER — AMLODIPINE BESYLATE 2.5 MG PO TABS
2.5000 mg | ORAL_TABLET | Freq: Every day | ORAL | 5 refills | Status: DC
Start: 2023-04-27 — End: 2023-05-25

## 2023-04-27 MED ORDER — TRIAMTERENE-HCTZ 37.5-25 MG PO TABS: 1.0000 | ORAL_TABLET | Freq: Every day | ORAL | 3 refills | Status: DC

## 2023-04-27 NOTE — Progress Notes (Signed)
Established Patient Office Visit  Subjective   Patient ID: Bridget Flynn, female    DOB: January 02, 1956  Age: 67 y.o. MRN: [id number]  Chief Complaint  Patient presents with   Fatigue        Numbness    Patient complains of numbness/tingling in hands    hypokalemia    HPI   Bridget Flynn is here to discuss several issues including recent hypokalemia and fatigue.  She had sinus surgery back in May at [facility].  Her preop labs included potassium 2.9.  She has been on HCTZ for quite some time.  She was given potassium replacement.  She is concerned her potassium may have dropped back down.  She has had some nonspecific fatigue issues.  Occasional numbness and tingling in the hands which may be more positional.  No focal weakness.  No recent leg cramps.  She has hyperlipidemia treated with Crestor 20 mg daily.  She does not always take this daily secondary to some muscle aches with daily use.  Overall, she has had very good results from her sinus surgery and is pleased with that.  Breathing much easier.  She had significant turbinate reduction.  Past Medical History:  Diagnosis Date   ALLERGIC RHINITIS 11/25/2007   Qualifier: Diagnosis of  By: Yetta Barre CNA/MA, Jessica     Allergy    Asthma    Headache    HYPERLIPIDEMIA 07/12/2009   Qualifier: Diagnosis of  By: Gabriel Rung LPN, Nancy     Hypertension    Microscopic hematuria    negative urology evaluation   Osteopenia    VITAMIN D DEFICIENCY 07/12/2009   Qualifier: Diagnosis of  By: Gabriel Rung LPN, Harriett Sine     Past Surgical History:  Procedure Laterality Date   COLONOSCOPY W/ POLYPECTOMY  2004   DILATION AND CURETTAGE OF UTERUS     ENDOSCOPIC TURBINATE REDUCTION Bilateral 01/26/2023   Procedure: ENDOSCOPIC TURBINATE REDUCTION;  Surgeon: Laren Boom, DO;  Location: [facility] OR;  Service: ENT;  Laterality: Bilateral;   LAPAROSCOPIC BILATERAL SALPINGECTOMY  08/24/08   SINUS ENDO WITH FUSION Bilateral 01/26/2023   Procedure: SINUS ENDO WITH FUSION;   Surgeon: Laren Boom, DO;  Location: [facility] OR;  Service: ENT;  Laterality: Bilateral;    reports that she has never smoked. She has never used smokeless tobacco. She reports that she does not currently use alcohol. She reports that she does not use drugs. family history includes Cancer in her father, mother, and another family member; Cirrhosis in her mother; Diabetes in her father, mother, and another family member. Allergies  Allergen Reactions   Allegra [Fexofenadine Hcl] Other (See Comments)    Depression, cryingspells   Biaxin [Clarithromycin] Diarrhea, Nausea And Vomiting and Other (See Comments)    Syncope episode   Erythromycin Other (See Comments)    GI upset   Lipitor [Atorvastatin] Other (See Comments)    arthralgias   Sulfa Antibiotics Nausea Only    Review of Systems  Constitutional:  Negative for malaise/fatigue.  Eyes:  Negative for blurred vision.  Respiratory:  Negative for shortness of breath.   Cardiovascular:  Negative for chest pain.  Neurological:  Negative for dizziness, weakness and headaches.      Objective:     BP (!) 164/84 (BP Location: Left Arm, Patient Position: Sitting, Cuff Size: Normal)   Pulse 60   Temp 97.7 F (36.5 C) (Oral)   Ht 5\' 3"  (1.6 m)   Wt 142 lb 9.6 oz (64.7 kg)  LMP 10/26/2005   SpO2 98%   BMI 25.26 kg/m  BP Readings from Last 3 Encounters:  04/27/23 (!) 164/84  01/26/23 134/71  01/22/23 (!) 151/94   Wt Readings from Last 3 Encounters:  04/27/23 142 lb 9.6 oz (64.7 kg)  01/26/23 145 lb (65.8 kg)  01/25/23 148 lb 11.2 oz (67.4 kg)      Physical Exam Vitals reviewed.  Constitutional:      Appearance: Normal appearance. She is well-developed.  Eyes:     Pupils: Pupils are equal, round, and reactive to light.  Neck:     Thyroid: No thyromegaly.     Vascular: No JVD.  Cardiovascular:     Rate and Rhythm: Normal rate and regular rhythm.     Heart sounds:     No gallop.  Pulmonary:     Effort: Pulmonary  effort is normal. No respiratory distress.     Breath sounds: Normal breath sounds. No wheezing or rales.  Musculoskeletal:     Cervical back: Neck supple.     Right lower leg: No edema.     Left lower leg: No edema.  Neurological:     Mental Status: She is alert.      No results found for any visits on 04/27/23.  Last CBC Lab Results  Component Value Date   WBC 7.5 01/22/2023   HGB 12.6 01/26/2023   HCT 37.0 01/26/2023   MCV 83.7 01/22/2023   MCH 29.7 01/22/2023   RDW 12.5 01/22/2023   PLT 279 01/22/2023   Last metabolic panel Lab Results  Component Value Date   GLUCOSE 98 01/26/2023   NA 137 01/26/2023   K 3.2 (L) 01/26/2023   CL 97 (L) 01/26/2023   CO2 34 (H) 01/22/2023   BUN 12 01/26/2023   CREATININE 0.60 01/26/2023   GFRNONAA >60 01/22/2023   CALCIUM 9.3 01/22/2023   PROT 8.1 09/15/2022   ALBUMIN 4.8 09/15/2022   BILITOT 0.4 09/15/2022   ALKPHOS 47 09/15/2022   AST 20 09/15/2022   ALT 19 09/15/2022   ANIONGAP 11 01/22/2023   Last lipids Lab Results  Component Value Date   CHOL 166 09/15/2022   HDL 57.30 09/15/2022   LDLCALC 95 09/15/2022   LDLDIRECT 195.9 10/22/2013   TRIG 67.0 09/15/2022   CHOLHDL 3 09/15/2022   Last thyroid functions Lab Results  Component Value Date   TSH 3.39 09/15/2022      The 10-year ASCVD risk score (Arnett DK, et al., 2019) is: 15%    Assessment & Plan:   #1 hypertension poorly controlled.  Predominantly isolated systolic hypertension today.  Continue low-sodium diet.  Switching HCTZ to potassium sparing diuretic as below.  Add low-dose amlodipine 2.5 mg daily.  Continue daily exercise with walking.  Set up follow-up in approxi-1 month to reassess.  #2 recurrent hypokalemia probably secondary to HCTZ.  Will change to Maxide 37.5/25 mg 1 daily.  Continue potassium rich diet.  Recheck basic metabolic panel and magnesium level today.  #3 fatigue.  Questionably secondary to #2 and #1 above.   Return in about 1 month  (around 05/28/2023).    Evelena Peat, MD

## 2023-05-09 ENCOUNTER — Encounter: Payer: Self-pay | Admitting: Family Medicine

## 2023-05-09 ENCOUNTER — Ambulatory Visit: Payer: Medicare Other | Admitting: Family Medicine

## 2023-05-09 VITALS — BP 144/80 | HR 75 | Temp 98.3°F | Ht 63.0 in | Wt 143.7 lb

## 2023-05-09 DIAGNOSIS — R002 Palpitations: Secondary | ICD-10-CM | POA: Diagnosis not present

## 2023-05-09 DIAGNOSIS — R0789 Other chest pain: Secondary | ICD-10-CM

## 2023-05-09 DIAGNOSIS — Z8249 Family history of ischemic heart disease and other diseases of the circulatory system: Secondary | ICD-10-CM | POA: Diagnosis not present

## 2023-05-09 DIAGNOSIS — I10 Essential (primary) hypertension: Secondary | ICD-10-CM | POA: Diagnosis not present

## 2023-05-09 NOTE — Progress Notes (Addendum)
Established Patient Office Visit  Subjective   Patient ID: Bridget Flynn, female    DOB: 08/04/1956  Age: 67 y.o. MRN: [id number]  Chief Complaint  Patient presents with   Medication Reaction   Chest Pain    HPI   Bridget Flynn has hypertension.  We recently added low-dose amlodipine 2.5 mg daily and switched her from HCTZ to [facility] after she had some hypokalemia.  She brings in home blood pressures and these are significantly improved.  Most of these are 120s to 130s systolic and diastolics also well-controlled.  She had occasional lightheadedness.  She had fluttering sensation on the fifth of this month but blood pressure was stable.  No tachycardia.  Did not appreciate any significant arrhythmia.  She did have 1 day where she had some chest pressure which lasted much of the day but this has never been with activity.  She sometimes walks up to 3 miles per day without difficulty.  She is concerned because her father had MI at age 37.  She has not had any recent dyspnea.  No diaphoresis.  No nausea or vomiting. She is a non-smoker.  No history of diabetes.  Recent lipids total cholesterol 166, HDL 57, LDL 95  The 10-year ASCVD risk score (Arnett DK, et al., 2019) is: 11.8%   Values used to calculate the score:     Age: 34 years     Sex: Female     Is Non-Hispanic African American: No     Diabetic: No     Tobacco smoker: No     Systolic Blood Pressure: 144 mmHg     Is BP treated: Yes     HDL Cholesterol: 57.3 mg/dL     Total Cholesterol: 166 mg/dL   Past Medical History:  Diagnosis Date   ALLERGIC RHINITIS 11/25/2007   Qualifier: Diagnosis of  By: Yetta Barre CNA/MA, Jessica     Allergy    Asthma    Headache    HYPERLIPIDEMIA 07/12/2009   Qualifier: Diagnosis of  By: Gabriel Rung LPN, Nancy     Hypertension    Microscopic hematuria    negative urology evaluation   Osteopenia    VITAMIN D DEFICIENCY 07/12/2009   Qualifier: Diagnosis of  By: Gabriel Rung LPN, Harriett Sine     Past Surgical History:   Procedure Laterality Date   COLONOSCOPY W/ POLYPECTOMY  2004   DILATION AND CURETTAGE OF UTERUS     ENDOSCOPIC TURBINATE REDUCTION Bilateral 01/26/2023   Procedure: ENDOSCOPIC TURBINATE REDUCTION;  Surgeon: Laren Boom, DO;  Location: [facility] OR;  Service: ENT;  Laterality: Bilateral;   LAPAROSCOPIC BILATERAL SALPINGECTOMY  08/24/08   SINUS ENDO WITH FUSION Bilateral 01/26/2023   Procedure: SINUS ENDO WITH FUSION;  Surgeon: Laren Boom, DO;  Location: [facility] OR;  Service: ENT;  Laterality: Bilateral;    reports that she has never smoked. She has never used smokeless tobacco. She reports that she does not currently use alcohol. She reports that she does not use drugs. family history includes Cancer in her father, mother, and another family member; Cirrhosis in her mother; Diabetes in her father, mother, and another family member. Allergies  Allergen Reactions   Allegra [Fexofenadine Hcl] Other (See Comments)    Depression, cryingspells   Biaxin [Clarithromycin] Diarrhea, Nausea And Vomiting and Other (See Comments)    Syncope episode   Erythromycin Other (See Comments)    GI upset   Lipitor [Atorvastatin] Other (See Comments)    arthralgias   Sulfa Antibiotics Nausea  Only    Review of Systems  Respiratory:  Negative for cough and shortness of breath.   Cardiovascular:  Positive for chest pain and palpitations. Negative for leg swelling.  Neurological:  Negative for loss of consciousness.      Objective:     BP (!) 144/80 (BP Location: Left Arm, Patient Position: Sitting, Cuff Size: Normal)   Pulse 75   Temp 98.3 F (36.8 C) (Oral)   Ht 5\' 3"  (1.6 m)   Wt 143 lb 11.2 oz (65.2 kg)   LMP 10/26/2005   SpO2 98%   BMI 25.46 kg/m  BP Readings from Last 3 Encounters:  05/09/23 (!) 144/80  05/01/23 (!) 164/84  01/26/23 134/71   Wt Readings from Last 3 Encounters:  05/09/23 143 lb 11.2 oz (65.2 kg)  04/27/23 142 lb 9.6 oz (64.7 kg)  01/26/23 145 lb (65.8 kg)       Physical Exam Vitals reviewed.  Constitutional:      General: She is not in acute distress.    Appearance: She is well-developed. She is not toxic-appearing.  Cardiovascular:     Rate and Rhythm: Normal rate and regular rhythm.  Pulmonary:     Effort: Pulmonary effort is normal.     Breath sounds: Normal breath sounds.  Musculoskeletal:     Right lower leg: No edema.     Left lower leg: No edema.  Neurological:     Mental Status: She is alert.      No results found for any visits on 05/09/23.  Last CBC Lab Results  Component Value Date   WBC 7.5 01/22/2023   HGB 12.6 01/26/2023   HCT 37.0 01/26/2023   MCV 83.7 01/22/2023   MCH 29.7 01/22/2023   RDW 12.5 01/22/2023   PLT 279 01/22/2023   Last metabolic panel Lab Results  Component Value Date   GLUCOSE 90 04/27/2023   NA 134 (L) 04/27/2023   K 3.7 04/27/2023   CL 91 (L) 04/27/2023   CO2 36 (H) 04/27/2023   BUN 10 04/27/2023   CREATININE 0.58 04/27/2023   GFR 92.84 04/27/2023   CALCIUM 10.1 04/27/2023   PROT 8.1 09/15/2022   ALBUMIN 4.8 09/15/2022   BILITOT 0.4 09/15/2022   ALKPHOS 47 09/15/2022   AST 20 09/15/2022   ALT 19 09/15/2022   ANIONGAP 11 01/22/2023   Last lipids Lab Results  Component Value Date   CHOL 166 09/15/2022   HDL 57.30 09/15/2022   LDLCALC 95 09/15/2022   LDLDIRECT 195.9 10/22/2013   TRIG 67.0 09/15/2022   CHOLHDL 3 09/15/2022   Last hemoglobin A1c Lab Results  Component Value Date   HGBA1C 5.3 07/03/2016   Last thyroid functions Lab Results  Component Value Date   TSH 3.39 09/15/2022      The 10-year ASCVD risk score (Arnett DK, et al., 2019) is: 11.8%    Assessment & Plan:   #1 recent palpitations.  Patient describes fluttering sensation of heart but no documented tachyarrhythmia.  She does have Apple Watch and we looked at a couple of recordings but did not see any ventricular ectopy or other significant arrhythmias. -Check EKG -Minimize caffeine use -Consider  ZIO home heart monitor  #2 recent atypical chest pain.  She has never had any exertional discomfort whatsoever.  Does have family history of premature CAD as above. Consider coronary calcium score  #3 hypertension improved.  Currently on combination therapy with low-dose amlodipine and Maxide.  If palpitations continue consider change from Amlodipine  to Diltiazem.    EKG shows NSR with no acute ST-T changes.  No follow-ups on file.    Evelena Peat, MD

## 2023-05-09 NOTE — Patient Instructions (Signed)
I will be setting up coronary calcium score and home heart monitor.

## 2023-05-10 ENCOUNTER — Ambulatory Visit: Payer: Medicare Other | Attending: Family Medicine

## 2023-05-10 DIAGNOSIS — R002 Palpitations: Secondary | ICD-10-CM | POA: Diagnosis not present

## 2023-05-10 NOTE — Progress Notes (Unsigned)
Enrolled for [employer] to mail a ZIO XT long term holter monitor to the patients address on file.   DOD to read. 

## 2023-05-16 ENCOUNTER — Ambulatory Visit (HOSPITAL_COMMUNITY)
Admission: RE | Admit: 2023-05-16 | Discharge: 2023-05-16 | Disposition: A | Discharge status: Home / Self Care | Payer: Self-pay | Source: Ambulatory Visit | Attending: Family Medicine | Admitting: Family Medicine

## 2023-05-16 DIAGNOSIS — Z8249 Family history of ischemic heart disease and other diseases of the circulatory system: Secondary | ICD-10-CM

## 2023-05-25 ENCOUNTER — Ambulatory Visit (INDEPENDENT_AMBULATORY_CARE_PROVIDER_SITE_OTHER): Payer: Medicare Other | Admitting: Family Medicine

## 2023-05-25 ENCOUNTER — Encounter: Payer: Self-pay | Admitting: Family Medicine

## 2023-05-25 VITALS — BP 132/68 | HR 58 | Temp 97.8°F | Ht 63.0 in | Wt 139.8 lb

## 2023-05-25 DIAGNOSIS — R519 Headache, unspecified: Secondary | ICD-10-CM

## 2023-05-25 DIAGNOSIS — R002 Palpitations: Secondary | ICD-10-CM | POA: Diagnosis not present

## 2023-05-25 DIAGNOSIS — E785 Hyperlipidemia, unspecified: Secondary | ICD-10-CM | POA: Diagnosis not present

## 2023-05-25 DIAGNOSIS — I10 Essential (primary) hypertension: Secondary | ICD-10-CM | POA: Diagnosis not present

## 2023-05-25 MED ORDER — DILTIAZEM HCL ER COATED BEADS 120 MG PO CP24
120.0000 mg | ORAL_CAPSULE | Freq: Every day | ORAL | 5 refills | Status: DC
Start: 2023-05-25 — End: 2023-05-30

## 2023-05-25 NOTE — Progress Notes (Signed)
Established Patient Office Visit  Subjective   Patient ID: Bridget Flynn, female    DOB: 06/12/1956  Age: 67 y.o. MRN: [id number]  Chief Complaint  Patient presents with   Medical Management of Chronic Issues    HPI   Bridget Flynn is here for follow-up regarding hypertension and recent increased palpitations.  Her blood pressure has been very well-controlled by several home readings that she brings in today to review on combination with amlodipine 2.5 mg daily and Maxide.  She has had some hypokalemia with hydrochlorothiazide.  Even though her blood pressure is well-controlled she has had some almost daily headaches and some flushed sensation which we explained may be related to the amlodipine.  She has strong family history of coronary artery disease with father at age 44 and brother age 26.  She had coronary calcium score of 7.3 which is all right coronary artery.  This was 51st percentile for age and gender.  Recent lipids total cholesterol 166 with HDL 57 and LDL 95.  She does take rosuvastatin  She has had frequent palpitations and just recently turned in her Zio patch.  She thinks that palpitations may be somewhat worse since starting the amlodipine.  No syncope.  No chest pain.  She has essentially eliminated caffeine and drinks no alcohol.  Her recent CT cardiac calcium scan revealed atheromatous calcifications of aorta.  Comment of ectatic ascending thoracic aorta with diameter 4.1 cm.  Cardiac interpretation commented on borderline dilation of ascending aorta measuring 38 mm at mid ascending aorta  Past Medical History:  Diagnosis Date   ALLERGIC RHINITIS 11/25/2007   Qualifier: Diagnosis of  By: Yetta Barre CNA/MA, Jessica     Allergy    Asthma    Headache    HYPERLIPIDEMIA 07/12/2009   Qualifier: Diagnosis of  By: Gabriel Rung LPN, Nancy     Hypertension    Microscopic hematuria    negative urology evaluation   Osteopenia    VITAMIN D DEFICIENCY 07/12/2009   Qualifier: Diagnosis of   By: Gabriel Rung LPN, Harriett Sine     Past Surgical History:  Procedure Laterality Date   COLONOSCOPY W/ POLYPECTOMY  2004   DILATION AND CURETTAGE OF UTERUS     ENDOSCOPIC TURBINATE REDUCTION Bilateral 01/26/2023   Procedure: ENDOSCOPIC TURBINATE REDUCTION;  Surgeon: Laren Boom, DO;  Location: [facility] OR;  Service: ENT;  Laterality: Bilateral;   LAPAROSCOPIC BILATERAL SALPINGECTOMY  08/24/08   SINUS ENDO WITH FUSION Bilateral 01/26/2023   Procedure: SINUS ENDO WITH FUSION;  Surgeon: Laren Boom, DO;  Location: [facility] OR;  Service: ENT;  Laterality: Bilateral;    reports that she has never smoked. She has never used smokeless tobacco. She reports that she does not currently use alcohol. She reports that she does not use drugs. family history includes Cancer in her father, mother, and another family member; Cirrhosis in her mother; Diabetes in her father, mother, and another family member. Allergies  Allergen Reactions   Allegra [Fexofenadine Hcl] Other (See Comments)    Depression, cryingspells   Biaxin [Clarithromycin] Diarrhea, Nausea And Vomiting and Other (See Comments)    Syncope episode   Erythromycin Other (See Comments)    GI upset   Lipitor [Atorvastatin] Other (See Comments)    arthralgias   Sulfa Antibiotics Nausea Only    Review of Systems  Constitutional:  Negative for chills and fever.  Cardiovascular:  Positive for palpitations. Negative for chest pain and leg swelling.  Neurological:  Positive for headaches.  Objective:     BP 132/68 (BP Location: Left Arm, Cuff Size: Normal)   Pulse (!) 58   Temp 97.8 F (36.6 C) (Oral)   Ht 5\' 3"  (1.6 m)   Wt 139 lb 12.8 oz (63.4 kg)   LMP 10/26/2005   SpO2 98%   BMI 24.76 kg/m  BP Readings from Last 3 Encounters:  05/25/23 132/68  05/10/23 (!) 144/80  05/01/23 (!) 164/84   Wt Readings from Last 3 Encounters:  05/25/23 139 lb 12.8 oz (63.4 kg)  05/09/23 143 lb 11.2 oz (65.2 kg)  04/27/23 142 lb 9.6 oz (64.7 kg)       Physical Exam Vitals reviewed.  Constitutional:      General: She is not in acute distress.    Appearance: Normal appearance.  Cardiovascular:     Rate and Rhythm: Normal rate.     Heart sounds: No murmur heard.    No gallop.     Comments: Only occasional infrequent premature beat Pulmonary:     Effort: Pulmonary effort is normal.     Breath sounds: Normal breath sounds.  Neurological:     Mental Status: She is alert.      No results found for any visits on 05/25/23.  Last CBC Lab Results  Component Value Date   WBC 7.5 01/22/2023   HGB 12.6 01/26/2023   HCT 37.0 01/26/2023   MCV 83.7 01/22/2023   MCH 29.7 01/22/2023   RDW 12.5 01/22/2023   PLT 279 01/22/2023   Last metabolic panel Lab Results  Component Value Date   GLUCOSE 90 04/27/2023   NA 134 (L) 04/27/2023   K 3.7 04/27/2023   CL 91 (L) 04/27/2023   CO2 36 (H) 04/27/2023   BUN 10 04/27/2023   CREATININE 0.58 04/27/2023   GFR 92.84 04/27/2023   CALCIUM 10.1 04/27/2023   PROT 8.1 09/15/2022   ALBUMIN 4.8 09/15/2022   BILITOT 0.4 09/15/2022   ALKPHOS 47 09/15/2022   AST 20 09/15/2022   ALT 19 09/15/2022   ANIONGAP 11 01/22/2023   Last lipids Lab Results  Component Value Date   CHOL 166 09/15/2022   HDL 57.30 09/15/2022   LDLCALC 95 09/15/2022   LDLDIRECT 195.9 10/22/2013   TRIG 67.0 09/15/2022   CHOLHDL 3 09/15/2022   Last thyroid functions Lab Results  Component Value Date   TSH 3.39 09/15/2022      The 10-year ASCVD risk score (Arnett DK, et al., 2019) is: 10%    Assessment & Plan:   #1 hypertension stable and well-controlled currently on amlodipine and Maxide.  Possible side effects with amlodipine.  She has had some intermittent flushing and headaches which could be related for sure.  Will try switching to diltiazem CD120 mg daily.  If she continues to have headaches with that may need to consider ARB or ACE Continue to monitor closely at home  #2 frequent almost daily  headaches.  Possibly related to amlodipine.  Discontinue as above.  Be in touch if headaches not seizing over the next week or 2  #3 frequent palpitations.  Patient recently completed Zio patch and we are waiting for results of that.  Continue to avoid caffeine and alcohol.  Switching to diltiazem CD1 120 mg once daily  #4 strong family history of premature CAD.  Recent coronary calcium score of 7.3 which is reassuring.  She did have comment of borderline dilation of ascending aorta.  Consider repeat imaging in a few years to reassess  Bridget Flynn  Caryl Never, MD

## 2023-05-25 NOTE — Patient Instructions (Signed)
Stop the Amlodipine  Start the Cardizem/Diltiazem once daily in place of that to see if side effects diminish.   Give me some BP readings in 3-4 weeks

## 2023-05-28 ENCOUNTER — Encounter: Payer: Self-pay | Admitting: Family Medicine

## 2023-05-30 ENCOUNTER — Encounter: Payer: Self-pay | Admitting: Family Medicine

## 2023-05-30 ENCOUNTER — Ambulatory Visit (INDEPENDENT_AMBULATORY_CARE_PROVIDER_SITE_OTHER): Payer: Medicare Other | Admitting: Family Medicine

## 2023-05-30 VITALS — BP 128/62 | HR 62 | Temp 98.3°F | Ht 63.0 in | Wt 139.5 lb

## 2023-05-30 DIAGNOSIS — R002 Palpitations: Secondary | ICD-10-CM

## 2023-05-30 DIAGNOSIS — I10 Essential (primary) hypertension: Secondary | ICD-10-CM | POA: Diagnosis not present

## 2023-05-30 DIAGNOSIS — Z20822 Contact with and (suspected) exposure to covid-19: Secondary | ICD-10-CM

## 2023-05-30 LAB — POC COVID19 BINAXNOW: SARS Coronavirus 2 Ag: NEGATIVE

## 2023-05-30 MED ORDER — AMLODIPINE BESYLATE 2.5 MG PO TABS: 2.5000 mg | ORAL_TABLET | Freq: Every day | ORAL | 11 refills | Status: DC

## 2023-05-30 NOTE — Progress Notes (Signed)
Established Patient Office Visit  Subjective   Patient ID: Bridget Flynn, female    DOB: [birth year]  Age: 67 y.o. MRN: [id number]  Chief Complaint  Patient presents with   Medication Reaction    HPI   Bridget Flynn is seen to discuss blood pressure medications.  She had been on plain HCTZ but had some hypokalemia and was eventually switched to oral Maxide.  She was started on low-dose amlodipine and had some palpitations and we switched to diltiazem.  She states that she took the diltiazem and next morning woke up with headache.  She also had some dizziness.  She had had some mild headache previously with low-dose amlodipine.  She ended up stopping the Maxide and diltiazem and switch back to low-dose amlodipine 2.5 mg daily along with plain HCTZ and feels like she is asymptomatic at this time.  Blood pressures been improved.  She is focusing on a high potassium diet.  She had recent coronary calcium score of 7.  We obtained event monitor to evaluate her palpitations and this showed no malignant arrhythmias.  Infrequent ectopy.  1 brief run of supraventricular tachycardia-a few beats.  No V. tach.  She has reduced her caffeine and is basically off caffeine at this time.  Her husband was just diagnosed with COVID infection.  They think he was exposed over the weekend.  Bridget Flynn has no symptoms at this time.  She tested negative here today.  They are trying to stay isolated at home  Past Medical History:  Diagnosis Date   ALLERGIC RHINITIS 11/25/2007   Qualifier: Diagnosis of  By: Yetta Barre CNA/MA, Jessica     Allergy    Asthma    Headache    HYPERLIPIDEMIA 07/12/2009   Qualifier: Diagnosis of  By: Gabriel Rung LPN, Nancy     Hypertension    Microscopic hematuria    negative urology evaluation   Osteopenia    VITAMIN D DEFICIENCY 07/12/2009   Qualifier: Diagnosis of  By: Gabriel Rung LPN, Harriett Sine     Past Surgical History:  Procedure Laterality Date   COLONOSCOPY W/ POLYPECTOMY  2004   DILATION AND  CURETTAGE OF UTERUS     ENDOSCOPIC TURBINATE REDUCTION Bilateral 01/26/2023   Procedure: ENDOSCOPIC TURBINATE REDUCTION;  Surgeon: Laren Boom, DO;  Location: [facility] OR;  Service: ENT;  Laterality: Bilateral;   LAPAROSCOPIC BILATERAL SALPINGECTOMY  08/24/08   SINUS ENDO WITH FUSION Bilateral 01/26/2023   Procedure: SINUS ENDO WITH FUSION;  Surgeon: Laren Boom, DO;  Location: [facility] OR;  Service: ENT;  Laterality: Bilateral;    reports that she has never smoked. She has never used smokeless tobacco. She reports that she does not currently use alcohol. She reports that she does not use drugs. family history includes Cancer in her father, mother, and another family member; Cirrhosis in her mother; Diabetes in her father, mother, and another family member. Allergies  Allergen Reactions   Allegra [Fexofenadine Hcl] Other (See Comments)    Depression, cryingspells   Biaxin [Clarithromycin] Diarrhea, Nausea And Vomiting and Other (See Comments)    Syncope episode   Erythromycin Other (See Comments)    GI upset   Lipitor [Atorvastatin] Other (See Comments)    arthralgias   Sulfa Antibiotics Nausea Only    Review of Systems  Constitutional:  Negative for malaise/fatigue.  Eyes:  Negative for blurred vision.  Respiratory:  Negative for shortness of breath.   Cardiovascular:  Negative for chest pain.  Neurological:  Negative for dizziness, weakness and  headaches.      Objective:     BP 128/62 (BP Location: Left Arm, Cuff Size: Normal)   Pulse 62   Temp 98.3 F (36.8 C) (Oral)   Ht 5\' 3"  (1.6 m)   Wt 139 lb 8 oz (63.3 kg)   LMP 10/26/2005   SpO2 98%   BMI 24.71 kg/m  BP Readings from Last 3 Encounters:  05/30/23 128/62  05/25/23 132/68  05/10/23 (!) 144/80   Wt Readings from Last 3 Encounters:  05/30/23 139 lb 8 oz (63.3 kg)  05/25/23 139 lb 12.8 oz (63.4 kg)  05/09/23 143 lb 11.2 oz (65.2 kg)      Physical Exam Vitals reviewed.  Constitutional:      Appearance:  She is well-developed.  Eyes:     Pupils: Pupils are equal, round, and reactive to light.  Neck:     Thyroid: No thyromegaly.     Vascular: No JVD.  Cardiovascular:     Rate and Rhythm: Normal rate and regular rhythm.     Heart sounds:     No gallop.  Pulmonary:     Effort: Pulmonary effort is normal. No respiratory distress.     Breath sounds: Normal breath sounds. No wheezing or rales.  Musculoskeletal:     Cervical back: Neck supple.  Neurological:     Mental Status: She is alert.      Results for orders placed or performed in visit on 05/30/23  POC COVID-19  Result Value Ref Range   SARS Coronavirus 2 Ag Negative Negative    Last metabolic panel Lab Results  Component Value Date   GLUCOSE 90 04/27/2023   NA 134 (L) 04/27/2023   K 3.7 04/27/2023   CL 91 (L) 04/27/2023   CO2 36 (H) 04/27/2023   BUN 10 04/27/2023   CREATININE 0.58 04/27/2023   GFR 92.84 04/27/2023   CALCIUM 10.1 04/27/2023   PROT 8.1 09/15/2022   ALBUMIN 4.8 09/15/2022   BILITOT 0.4 09/15/2022   ALKPHOS 47 09/15/2022   AST 20 09/15/2022   ALT 19 09/15/2022   ANIONGAP 11 01/22/2023   Last lipids Lab Results  Component Value Date   CHOL 166 09/15/2022   HDL 57.30 09/15/2022   LDLCALC 95 09/15/2022   LDLDIRECT 195.9 10/22/2013   TRIG 67.0 09/15/2022   CHOLHDL 3 09/15/2022   Last thyroid functions Lab Results  Component Value Date   TSH 3.39 09/15/2022      The 10-year ASCVD risk score (Arnett DK, et al., 2019) is: 9.4%    Assessment & Plan:   #1 close exposure to COVID virus.  COVID testing negative.  She will try to continue to minimize her exposure.  Follow-up for any symptoms  #2 hypertension well-controlled today on low-dose amlodipine 2.5 mg daily and HCTZ 25 mg daily.  Continue current regimen.  Continue high potassium diet.  Recheck basic metabolic panel in 3 months at follow-up in December  #3 recent palpitations.  Reviewed results of event monitor.  No concerning  arrhythmias.  Continue to minimize caffeine.   No follow-ups on file.    Evelena Peat, MD

## 2023-06-12 ENCOUNTER — Encounter: Payer: Self-pay | Admitting: Family Medicine

## 2023-06-12 ENCOUNTER — Ambulatory Visit (INDEPENDENT_AMBULATORY_CARE_PROVIDER_SITE_OTHER): Payer: Medicare Other | Admitting: Family Medicine

## 2023-06-12 VITALS — BP 144/68 | HR 60 | Temp 98.2°F | Ht 63.0 in | Wt 139.5 lb

## 2023-06-12 DIAGNOSIS — I10 Essential (primary) hypertension: Secondary | ICD-10-CM | POA: Diagnosis not present

## 2023-06-12 NOTE — Progress Notes (Signed)
Established Patient Office Visit  Subjective   Patient ID: Bridget Flynn, female    DOB: [birth year]  Age: 67 y.o. MRN: [id number]  Chief Complaint  Patient presents with   Headache    Patient complains of headaches,x2 weeks, Patient reports headaches may have been caused by Amlodipine and has since discontinued, x4 days    HPI   Bridget Flynn is seen for follow-up regarding hypertension.  She had been on HCTZ for some time but was having several home readings up above 130s and over 150 here.  We initially added amlodipine but she had headache.  She then went to diltiazem but had headache likewise on that.  She went back to lower dose amlodipine 2.5 mg but had recurrence of headache after taking this for several days.  She took herself off amlodipine about 4 days ago and headaches have essentially resolved.  Her home blood pressures have consistently been 120s systolic and around 70 diastolic even off the amlodipine.  She is trying to watch her sodium intake closely.  Trying to walk more.  No regular alcohol use.  Have previously compared her cuff with ours and she actually had slightly higher readings with her cuff than ours but fairly consistent  Past Medical History:  Diagnosis Date   ALLERGIC RHINITIS 11/25/2007   Qualifier: Diagnosis of  By: Yetta Barre CNA/MA, Jessica     Allergy    Asthma    Headache    HYPERLIPIDEMIA 07/12/2009   Qualifier: Diagnosis of  By: Gabriel Rung LPN, Nancy     Hypertension    Microscopic hematuria    negative urology evaluation   Osteopenia    VITAMIN D DEFICIENCY 07/12/2009   Qualifier: Diagnosis of  By: Gabriel Rung LPN, Harriett Sine     Past Surgical History:  Procedure Laterality Date   COLONOSCOPY W/ POLYPECTOMY  2004   DILATION AND CURETTAGE OF UTERUS     ENDOSCOPIC TURBINATE REDUCTION Bilateral 01/26/2023   Procedure: ENDOSCOPIC TURBINATE REDUCTION;  Surgeon: Laren Boom, DO;  Location: [facility];  Service: ENT;  Laterality: Bilateral;   LAPAROSCOPIC BILATERAL  SALPINGECTOMY  08/24/08   SINUS ENDO WITH FUSION Bilateral 01/26/2023   Procedure: SINUS ENDO WITH FUSION;  Surgeon: Laren Boom, DO;  Location: [facility];  Service: ENT;  Laterality: Bilateral;    reports that she has never smoked. She has never used smokeless tobacco. She reports that she does not currently use alcohol. She reports that she does not use drugs. family history includes Cancer in her father, mother, and another family member; Cirrhosis in her mother; Diabetes in her father, mother, and another family member. Allergies  Allergen Reactions   Allegra [Fexofenadine Hcl] Other (See Comments)    Depression, cryingspells   Biaxin [Clarithromycin] Diarrhea, Nausea And Vomiting and Other (See Comments)    Syncope episode   Erythromycin Other (See Comments)    GI upset   Lipitor [Atorvastatin] Other (See Comments)    arthralgias   Norvasc [Amlodipine] Other (See Comments)    headache   Sulfa Antibiotics Nausea Only    Review of Systems  Constitutional:  Negative for malaise/fatigue.  Eyes:  Negative for blurred vision.  Respiratory:  Negative for shortness of breath.   Cardiovascular:  Negative for chest pain.  Neurological:  Negative for dizziness, weakness and headaches.      Objective:     BP (!) 144/68 (BP Location: Left Arm, Cuff Size: Normal)   Pulse 60   Temp 98.2 F (36.8 C) (Oral)  Ht 5\' 3"  (1.6 m)   Wt 139 lb 8 oz (63.3 kg)   LMP 10/26/2005   SpO2 98%   BMI 24.71 kg/m  BP Readings from Last 3 Encounters:  06/12/23 (!) 144/68  05/30/23 128/62  05/25/23 132/68   Wt Readings from Last 3 Encounters:  06/12/23 139 lb 8 oz (63.3 kg)  05/30/23 139 lb 8 oz (63.3 kg)  05/25/23 139 lb 12.8 oz (63.4 kg)      Physical Exam Vitals reviewed.  Constitutional:      Appearance: She is well-developed.  Eyes:     Pupils: Pupils are equal, round, and reactive to light.  Neck:     Thyroid: No thyromegaly.     Vascular: No JVD.  Cardiovascular:     Rate  and Rhythm: Normal rate and regular rhythm.     Heart sounds:     No gallop.  Pulmonary:     Effort: Pulmonary effort is normal. No respiratory distress.     Breath sounds: Normal breath sounds. No wheezing or rales.  Musculoskeletal:     Cervical back: Neck supple.  Neurological:     Mental Status: She is alert.      No results found for any visits on 06/12/23.  Last metabolic panel Lab Results  Component Value Date   GLUCOSE 90 04/27/2023   NA 134 (L) 04/27/2023   K 3.7 04/27/2023   CL 91 (L) 04/27/2023   CO2 36 (H) 04/27/2023   BUN 10 04/27/2023   CREATININE 0.58 04/27/2023   GFR 92.84 04/27/2023   CALCIUM 10.1 04/27/2023   PROT 8.1 09/15/2022   ALBUMIN 4.8 09/15/2022   BILITOT 0.4 09/15/2022   ALKPHOS 47 09/15/2022   AST 20 09/15/2022   ALT 19 09/15/2022   ANIONGAP 11 01/22/2023   Last lipids Lab Results  Component Value Date   CHOL 166 09/15/2022   HDL 57.30 09/15/2022   LDLCALC 95 09/15/2022   LDLDIRECT 195.9 10/22/2013   TRIG 67.0 09/15/2022   CHOLHDL 3 09/15/2022      The 21-HYQM ASCVD risk score (Arnett DK, et al., 2019) is: 11.8%    Assessment & Plan:   Hypertension with suspected whitecoat syndrome.  She had headaches with both amlodipine and diltiazem.  She has now been off the amlodipine for several days and taking only HCTZ and home blood pressures have consistently been 120s systolic and 70 diastolic.  Continue to monitor for now on HCTZ.  Continue low-sodium diet.  Continue regular exercise.  Handout on DASH diet given.  If her blood pressure starts to climb again consider addition of either ACE inhibitor or angiotensin receptor blocker but would avoid further calcium channel blockers.  She plans to schedule physical in December and check fasting lipids and other labs at that time   Bridget Peat, MD

## 2023-06-12 NOTE — Patient Instructions (Signed)
Monitor blood pressure and be in touch if consistently > 130 systolic (top number).

## 2023-09-14 ENCOUNTER — Ambulatory Visit (INDEPENDENT_AMBULATORY_CARE_PROVIDER_SITE_OTHER): Payer: Medicare Other | Admitting: Family Medicine

## 2023-09-14 ENCOUNTER — Encounter: Payer: Self-pay | Admitting: Family Medicine

## 2023-09-14 VITALS — BP 138/80 | HR 60 | Temp 98.4°F | Ht 63.0 in | Wt 133.2 lb

## 2023-09-14 DIAGNOSIS — R739 Hyperglycemia, unspecified: Secondary | ICD-10-CM | POA: Diagnosis not present

## 2023-09-14 DIAGNOSIS — E785 Hyperlipidemia, unspecified: Secondary | ICD-10-CM | POA: Diagnosis not present

## 2023-09-14 DIAGNOSIS — Z23 Encounter for immunization: Secondary | ICD-10-CM

## 2023-09-14 DIAGNOSIS — I10 Essential (primary) hypertension: Secondary | ICD-10-CM

## 2023-09-14 DIAGNOSIS — Z833 Family history of diabetes mellitus: Secondary | ICD-10-CM

## 2023-09-14 DIAGNOSIS — R059 Cough, unspecified: Secondary | ICD-10-CM

## 2023-09-14 LAB — COMPREHENSIVE METABOLIC PANEL
ALT: 20 U/L (ref 0–35)
AST: 21 U/L (ref 0–37)
Albumin: 4.6 g/dL (ref 3.5–5.2)
Alkaline Phosphatase: 40 U/L (ref 39–117)
BUN: 13 mg/dL (ref 6–23)
CO2: 35 meq/L — ABNORMAL HIGH (ref 19–32)
Calcium: 9.7 mg/dL (ref 8.4–10.5)
Chloride: 95 meq/L — ABNORMAL LOW (ref 96–112)
Creatinine, Ser: 0.61 mg/dL (ref 0.40–1.20)
GFR: 91.48 mL/min (ref >60.00)
Glucose, Bld: 95 mg/dL (ref 70–99)
Potassium: 3.6 meq/L (ref 3.5–5.1)
Sodium: 137 meq/L (ref 135–145)
Total Bilirubin: 0.5 mg/dL (ref 0.2–1.2)
Total Protein: 7.2 g/dL (ref 6.0–8.3)

## 2023-09-14 LAB — LIPID PANEL
Cholesterol: 151 mg/dL (ref 0–200)
HDL: 58.3 mg/dL (ref >39.00)
LDL Cholesterol: 84 mg/dL (ref 0–99)
NonHDL: 92.52
Total CHOL/HDL Ratio: 3
Triglycerides: 45 mg/dL (ref 0.0–149.0)
VLDL: 9 mg/dL (ref 0.0–40.0)

## 2023-09-14 LAB — HEMOGLOBIN A1C: Hgb A1c MFr Bld: 5.8 % (ref 4.6–6.5)

## 2023-09-14 MED ORDER — ALBUTEROL SULFATE HFA 108 (90 BASE) MCG/ACT IN AERS: 2.0000 | INHALATION_SPRAY | Freq: Four times a day (QID) | RESPIRATORY_TRACT | 0 refills | Status: AC | PRN

## 2023-09-14 MED ORDER — HYDROCHLOROTHIAZIDE 25 MG PO TABS: 25.0000 mg | ORAL_TABLET | Freq: Every day | ORAL | 3 refills | Status: DC

## 2023-09-14 MED ORDER — ROSUVASTATIN CALCIUM 20 MG PO TABS: 20.0000 mg | ORAL_TABLET | Freq: Every day | ORAL | 3 refills | Status: DC
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
## 2023-12-02 ENCOUNTER — Encounter: Payer: Self-pay | Admitting: Family Medicine

## 2023-12-03 ENCOUNTER — Ambulatory Visit: Payer: Self-pay | Admitting: Family Medicine

## 2023-12-03 ENCOUNTER — Ambulatory Visit (INDEPENDENT_AMBULATORY_CARE_PROVIDER_SITE_OTHER): Admitting: Family Medicine

## 2023-12-03 ENCOUNTER — Telehealth: Admitting: Family Medicine

## 2023-12-03 ENCOUNTER — Encounter: Payer: Self-pay | Admitting: Family Medicine

## 2023-12-03 DIAGNOSIS — J019 Acute sinusitis, unspecified: Secondary | ICD-10-CM

## 2023-12-03 DIAGNOSIS — J101 Influenza due to other identified influenza virus with other respiratory manifestations: Secondary | ICD-10-CM | POA: Diagnosis not present

## 2023-12-03 MED ORDER — AMOXICILLIN-POT CLAVULANATE 875-125 MG PO TABS: 1.0000 | ORAL_TABLET | Freq: Two times a day (BID) | ORAL | 0 refills | Status: DC

## 2023-12-03 NOTE — Progress Notes (Signed)
 Patient ID: Bridget Flynn, female   DOB: 30-Apr-1956, 69 y.o.   MRN: [id number]  This was a duplicate encounter.  Virtual visit today already documented in separate note  Kristian Covey MD [facility]

## 2023-12-03 NOTE — Progress Notes (Signed)
 Bridget Flynn

## 2023-12-03 NOTE — Progress Notes (Signed)
 Patient ID: Bridget Flynn, female   DOB: August 27, 1956, 68 y.o.   MRN: [id number]  Virtual Visit via Video Note  I connected with [name] on 12/03/23 at  4:30 PM EDT by a video enabled telemedicine application and verified that I am speaking with the correct person using two identifiers.  Location patient: home Location provider:work or home office Persons participating in the virtual visit: patient, provider  I discussed the limitations of evaluation and management by telemedicine and the availability of in person appointments. The patient expressed understanding and agreed to proceed.   HPI:  Sabreen is seen with positive influenza A test yesterday at home.  She states she came down with illness last Friday fairly acutely with severe backache and myalgias.  She then developed some fever and cough along with intermittent headache.  Temperatures been ranging 99 6-101.5.  Denies any nausea, vomiting, or diarrhea other than transient vertigo over the weekend with 1 episode of vomiting.  Her fevers down somewhat today.  Overall some improved.  She had actually set up appointment initially to be seen in office for concerns of recurrent sinusitis.  She had sinus surgery last May and has done extremely well since then until round of middle of February.  Since that time she has had some persistent facial pain and greenish to bloody nasal discharge intermittently.  Symptoms are very similar to prior sinusitis though not quite as severe.  She converted this to a virtual visit after testing positive for influenza.   ROS: See pertinent positives and negatives per HPI.  Past Medical History:  Diagnosis Date   ALLERGIC RHINITIS 11/25/2007   Qualifier: Diagnosis of  By: Yetta Barre CNA/MA, Jessica     Allergy    Asthma    Headache    HYPERLIPIDEMIA 07/12/2009   Qualifier: Diagnosis of  By: Gabriel Rung LPN, Nancy     Hypertension    Microscopic hematuria    negative urology evaluation   Osteopenia    VITAMIN D  DEFICIENCY 07/12/2009   Qualifier: Diagnosis of  By: Gabriel Rung LPN, Harriett Sine      Past Surgical History:  Procedure Laterality Date   COLONOSCOPY W/ POLYPECTOMY  2004   DILATION AND CURETTAGE OF UTERUS     ENDOSCOPIC TURBINATE REDUCTION Bilateral 01/26/2023   Procedure: ENDOSCOPIC TURBINATE REDUCTION;  Surgeon: Laren Boom, DO;  Location: [facility];  Service: ENT;  Laterality: Bilateral;   LAPAROSCOPIC BILATERAL SALPINGECTOMY  08/24/08   SINUS ENDO WITH FUSION Bilateral 01/26/2023   Procedure: SINUS ENDO WITH FUSION;  Surgeon: Laren Boom, DO;  Location: [facility];  Service: ENT;  Laterality: Bilateral;    Family History  Problem Relation Age of Onset   Diabetes Mother    Cancer Mother        ovarian cancer   Cirrhosis Mother    Diabetes Father    Cancer Father        Hodgkins lymphoma   Diabetes Other    Cancer Other        ovarian   BRCA 1/2 Neg Hx    Colon cancer Neg Hx    Colon polyps Neg Hx    Esophageal cancer Neg Hx    Rectal cancer Neg Hx    Stomach cancer Neg Hx    Breast cancer Neg Hx     SOCIAL HX: Non-smoker   Current Outpatient Medications:    amoxicillin-clavulanate (AUGMENTIN) 875-125 MG tablet, Take 1 tablet by mouth 2 (two) times daily., Disp: 20 tablet, Rfl:  0   albuterol (VENTOLIN HFA) 108 (90 Base) MCG/ACT inhaler, Inhale 2 puffs into the lungs every 6 (six) hours as needed for wheezing or shortness of breath., Disp: 8 g, Rfl: 0   Chlorpheniramine Maleate (CHLOR-TABLETS PO), Take 4 mg by mouth daily as needed (allergies.)., Disp: , Rfl:    COVID-19 mRNA Vac-TriS, Pfizer, (PFIZER-BIONT COVID-19 VAC-TRIS) SUSP injection, Inject into the muscle., Disp: 0.3 mL, Rfl: 0   estradiol (ESTRACE) 0.1 MG/GM vaginal cream, 1 gram vaginally twice weekly and apply a small amount of cream topically to urethra at the same time (Patient taking differently: Place 1 Applicatorful vaginally 2 (two) times a week. 1 gram vaginally Mondays & Thursdays at night. Apply a small  amount of cream topically to urethra at the same time), Disp: 42.5 g, Rfl: 2   fluticasone (FLONASE) 50 MCG/ACT nasal spray, Place 1 spray into both nostrils daily., Disp: , Rfl:    guaiFENesin (MUCINEX) 600 MG 12 hr tablet, Take 300 mg by mouth 2 (two) times daily as needed (congestion/cough)., Disp: , Rfl:    hydrochlorothiazide (HYDRODIURIL) 25 MG tablet, Take 1 tablet (25 mg total) by mouth daily., Disp: 90 tablet, Rfl: 3   rosuvastatin (CRESTOR) 20 MG tablet, Take 1 tablet (20 mg total) by mouth daily., Disp: 90 tablet, Rfl: 3   triamcinolone (KENALOG) 0.025 % cream, Apply 1 Application topically 2 (two) times daily. Apply to affected rash twice daily. (Patient taking differently: Apply 1 Application topically 2 (two) times daily as needed (eczema).), Disp: 15 g, Rfl: 0  EXAM:  VITALS per patient if applicable:  GENERAL: alert, oriented, appears well and in no acute distress  HEENT: atraumatic, conjunttiva clear, no obvious abnormalities on inspection of external nose and ears  NECK: normal movements of the head and neck  LUNGS: on inspection no signs of respiratory distress, breathing rate appears normal, no obvious gross SOB, gasping or wheezing  CV: no obvious cyanosis  MS: moves all visible extremities without noticeable abnormality  PSYCH/NEURO: pleasant and cooperative, no obvious depression or anxiety, speech and thought processing grossly intact  ASSESSMENT AND PLAN:  Discussed the following assessment and plan:  #1 influenza A.  Patient is over 72 hours since onset.  No indication for Tamiflu at this point.  She is gradually improving.  Continue plenty fluids and rest and over-the-counter analgesics as needed  #2 probable recurrent sinusitis.  Longstanding history of sinus infections with sinus surgery last May.  She has had now at least 1 month history of some facial pain and colored and intermittent bloody nasal discharge.  We agreed to start back Augmentin 875 mg twice  daily for 10 days.  Follow-up for any persistent or worsening symptoms     I discussed the assessment and treatment plan with the patient. The patient was provided an opportunity to ask questions and all were answered. The patient agreed with the plan and demonstrated an understanding of the instructions.   The patient was advised to call back or seek an in-person evaluation if the symptoms worsen or if the condition fails to improve as anticipated.     Evelena Peat, MD

## 2023-12-03 NOTE — Telephone Encounter (Signed)
 Copied from CRM [id number]. Topic: Clinical - Medical Advice >> Dec 03, 2023 10:31 AM Elizebeth Brooking wrote: Reason for CRM: Patient called in stating she thought a sinus infection but when and tested for flu/covid and was positive for flu a , stated Saturday she was dizzy and vomiting and diarrhea . Patient has an appointment schedule for today at 4:30 would like to know if she needs to still come in because she doesn't want to get anyone sick. Stated she has has a headache for a whole month and that's why she thinks she still may have a sinus infection as well. Would like for someone to give her a callback regarding this issue on if she needs to come in to be seen still   Chief Complaint: flu positive Symptoms: body aches, cough congestion, HA, back pain, fever, had V/D Frequency: Friday  Pertinent Negatives: Patient denies SOB Disposition: [] ED /[] Urgent Care (no appt availability in office) / [x] Appointment(In office/virtual)/ []  [facility]/ [] Home Care/ [] Refused Recommended Disposition /[] [facility]/ []  Follow-up with PCP Additional Notes: pt had appt for today at 430 with PCP d/t wanting to discuss having sinus sx for 1 month. Pt states she tested positive for flu A Saturday and was concerned for coming in. I was able to switch pt appt to VV today at same time. Pt is taking OTC meds to help with sx currently. Recommended continuing until VV today and further advice from PCP. Care advice given and pt verbalized understanding.    Reason for Disposition  Patient is HIGH RISK (e.g., age > 64 years, pregnant, HIV+, or chronic medical condition)  Answer Assessment - Initial Assessment Questions 1. WORST SYMPTOM: "What is your worst symptom?" (e.g., cough, runny nose, muscle aches, headache, sore throat, fever)      Body aches, cough, congestion 2. ONSET: "When did your flu symptoms start?"      Friday  3. COUGH: "How bad is the cough?"       yes 4. RESPIRATORY DISTRESS:  "Describe your breathing."      normal 5. FEVER: "Do you have a fever?" If Yes, ask: "What is your temperature, how was it measured, and when did it start?"     Low grade  10. OTHER SYMPTOMS: "Do you have any other symptoms?"  (e.g., runny nose, muscle aches, headache, sore throat)       Had vomiting and diarrhea, back pain, HA but been going on 1 month  Protocols used: Influenza (Flu) - [facility]

## 2023-12-20 ENCOUNTER — Other Ambulatory Visit: Payer: Self-pay | Admitting: Obstetrics & Gynecology

## 2023-12-20 DIAGNOSIS — Z1231 Encounter for screening mammogram for malignant neoplasm of breast: Secondary | ICD-10-CM

## 2023-12-26 ENCOUNTER — Ambulatory Visit
Admission: RE | Admit: 2023-12-26 | Discharge: 2023-12-26 | Disposition: A | Discharge status: Home / Self Care | Source: Ambulatory Visit | Attending: Obstetrics & Gynecology | Admitting: Obstetrics & Gynecology

## 2023-12-26 DIAGNOSIS — Z1231 Encounter for screening mammogram for malignant neoplasm of breast: Secondary | ICD-10-CM

## 2024-04-03 ENCOUNTER — Ambulatory Visit: Admitting: Family Medicine

## 2024-04-03 DIAGNOSIS — Z0000 Encounter for general adult medical examination without abnormal findings: Secondary | ICD-10-CM | POA: Diagnosis not present

## 2024-04-03 NOTE — Progress Notes (Signed)
 PATIENT CHECK-IN and HEALTH RISK ASSESSMENT QUESTIONNAIRE:  -completed by phone/video for upcoming Medicare Preventive Visit  Pre-Visit Check-in: 1)Vitals (height, wt, BP, etc) - record in vitals section for visit on day of visit Request home vitals (wt, BP, etc.) and enter into vitals, THEN update Vital Signs SmartPhrase below at the top of the HPI. See below.  2)Review and Update Medications, Allergies PMH, Surgeries, Social history in Epic 3)Hospitalizations in the last year with date/reason?  no  4)Review and Update Care Team (patient's specialists) in Epic 5) Complete PHQ9 in Epic  6) Complete Fall Screening in Epic 7)Review all Health Maintenance Due and order under PCP if not done.  Medicare Wellness Patient Questionnaire:  Answer theses question about your habits: How often do you have a drink containing alcohol?n How many drinks containing alcohol do you have on a typical day when you are drinking?na How often do you have six or more drinks on one occasion?na Have you ever smoked?n Quit date if applicable? na  How many packs a day do/did you smoke? na Do you use smokeless tobacco?n Do you use an illicit drugs?n On average, how many days per week do you engage in moderate to strenuous exercise (like a brisk walk)? Daily, yoga On average, how many minutes do you engage in exercise at this level?1 hour Typical diet: avoids sugar and carbs, eating lots of veggies, lots of salad, chicken, avoiding most red meat, nuts  Beverages: decaf sweet tea, water  Answer theses question about your everyday activities: Can you perform most household chores?y Are you deaf or have significant trouble hearing?n Do you feel that you have a problem with memory?n Do you feel safe at home?y Last dentist visit? Goes on regular basis 8. Do you have any difficulty performing your everyday activities?n Are you having any difficulty walking, taking medications on your own, and or difficulty managing  daily home needs?n Do you have difficulty walking or climbing stairs?n Do you have difficulty dressing or bathing?n Do you have difficulty doing errands alone such as visiting a doctor's office or shopping?n Do you currently have any difficulty preparing food and eating?n Do you currently have any difficulty using the toilet?n Do you have any difficulty managing your finances?n Do you have any difficulties with housekeeping of managing your housekeeping?n   Do you have Advanced Directives in place (Living Will, Healthcare Power or Attorney)? yes   Last eye Exam and location? [facility]   Do you currently use prescribed or non-prescribed narcotic or opioid pain medications?n  Do you have a history or close family history of breast, ovarian, tubal or peritoneal cancer or a family member with BRCA (breast cancer susceptibility 1 and 2) gene mutations? Mother - ovarian ca     ----------------------------------------------------------------------------------------------------------------------------------------------------------------------------------------------------------------------  Because this visit was a virtual/telehealth visit, some criteria may be missing or patient reported. Any vitals not documented were not able to be obtained and vitals that have been documented are patient reported.    MEDICARE ANNUAL PREVENTIVE VISIT WITH PROVIDER: (Welcome to Medicare, initial annual wellness or annual wellness exam)  Virtual Visit via Video Note  I connected with Bridget Flynn on 04/03/24 by a video enabled telemedicine application and verified that I am speaking with the correct person using two identifiers.  Location patient: home Location provider:work or home office Persons participating in the virtual visit: patient, provider  Concerns and/or follow up today: reports is doing awesome!   See HM section in Epic for other details of  completed HM.    ROS: negative  for report of fevers, unintentional weight loss, vision changes, vision loss, hearing loss or change, chest pain, sob, hemoptysis, melena, hematochezia, hematuria, falls, bleeding or bruising, thoughts of suicide or self harm, memory loss  Patient-completed extensive health risk assessment - reviewed and discussed with the patient: See Health Risk Assessment completed with patient prior to the visit either above or in recent phone note. This was reviewed in detailed with the patient today and appropriate recommendations, orders and referrals were placed as needed per Summary below and patient instructions.   Review of Medical History: -PMH, PSH, Family History and current specialty and care providers reviewed and updated and listed below   Patient Care Team: Micheal Wolm ORN, MD as PCP - General   Past Medical History:  Diagnosis Date   ALLERGIC RHINITIS 11/25/2007   Qualifier: Diagnosis of  By: Joshua CNA/MA, Jessica     Allergy    Asthma    Headache    HYPERLIPIDEMIA 07/12/2009   Qualifier: Diagnosis of  By: Lavinia LPN, Nancy     Hypertension    Microscopic hematuria    negative urology evaluation   Osteopenia    VITAMIN D  DEFICIENCY 07/12/2009   Qualifier: Diagnosis of  By: Lavinia LPN, Inocente      Past Surgical History:  Procedure Laterality Date   COLONOSCOPY W/ POLYPECTOMY  2004   DILATION AND CURETTAGE OF UTERUS     ENDOSCOPIC TURBINATE REDUCTION Bilateral 01/26/2023   Procedure: ENDOSCOPIC TURBINATE REDUCTION;  Surgeon: Llewellyn Gerard LABOR, DO;  Location: [facility] OR;  Service: ENT;  Laterality: Bilateral;   LAPAROSCOPIC BILATERAL SALPINGECTOMY  08/24/08   SINUS ENDO WITH FUSION Bilateral 01/26/2023   Procedure: SINUS ENDO WITH FUSION;  Surgeon: Llewellyn Gerard LABOR, DO;  Location: [facility] OR;  Service: ENT;  Laterality: Bilateral;    Social History   Socioeconomic History   Marital status: Married    Spouse name: Not on file   Number of children: 2   Years of education: Not on  file   Highest education level: Bachelor's degree (e.g., BA, AB, BS)  Occupational History   Not on file  Tobacco Use   Smoking status: Never   Smokeless tobacco: Never  Vaping Use   Vaping status: Never Used  Substance and Sexual Activity   Alcohol use: Not Currently   Drug use: No   Sexual activity: Yes    Partners: Male    Birth control/protection: Other-see comments    Comment: BSO  Other Topics Concern   Not on file  Social History Narrative   Not on file   Social Drivers of Health   Financial Resource Strain: Low Risk  (03/31/2024)   Overall Financial Resource Strain (CARDIA)    Difficulty of Paying Living Expenses: Not hard at all  Food Insecurity: No Food Insecurity (03/31/2024)   Hunger Vital Sign    Worried About Running Out of Food in the Last Year: Never true    Ran Out of Food in the Last Year: Never true  Transportation Needs: No Transportation Needs (03/31/2024)   PRAPARE - Administrator, Civil Service (Medical): No    Lack of Transportation (Non-Medical): No  Physical Activity: Sufficiently Active (03/31/2024)   Exercise Vital Sign    Days of Exercise per Week: 5 days    Minutes of Exercise per Session: 60 min  Stress: No Stress Concern Present (03/31/2024)   [employer] of Occupational Health - Occupational Stress Questionnaire  Feeling of Stress: Not at all  Social Connections: Moderately Integrated (03/31/2024)   Social Connection and Isolation Panel    Frequency of Communication with Friends and Family: More than three times a week    Frequency of Social Gatherings with Friends and Family: Twice a week    Attends Religious Services: Never    Database administrator or Organizations: Yes    Attends Banker Meetings: 1 to 4 times per year    Marital Status: Married  Catering manager Violence: Not At Risk (12/02/2020)   Humiliation, Afraid, Rape, and Kick questionnaire    Fear of Current or Ex-Partner: No    Emotionally Abused:  No    Physically Abused: No    Sexually Abused: No    Family History  Problem Relation Age of Onset   Diabetes Mother    Cancer Mother        ovarian cancer   Cirrhosis Mother    Diabetes Father    Cancer Father        Hodgkins lymphoma   Diabetes Other    Cancer Other        ovarian   BRCA 1/2 Neg Hx    Colon cancer Neg Hx    Colon polyps Neg Hx    Esophageal cancer Neg Hx    Rectal cancer Neg Hx    Stomach cancer Neg Hx    Breast cancer Neg Hx     Current Outpatient Medications on File Prior to Visit  Medication Sig Dispense Refill   albuterol  (VENTOLIN  HFA) 108 (90 Base) MCG/ACT inhaler Inhale 2 puffs into the lungs every 6 (six) hours as needed for wheezing or shortness of breath. 8 g 0   amoxicillin -clavulanate (AUGMENTIN ) 875-125 MG tablet Take 1 tablet by mouth 2 (two) times daily. 20 tablet 0   Chlorpheniramine Maleate (CHLOR-TABLETS PO) Take 4 mg by mouth daily as needed (allergies.).     COVID-19 mRNA Vac-TriS, Pfizer, (PFIZER-BIONT COVID-19 VAC-TRIS) SUSP injection Inject into the muscle. 0.3 mL 0   estradiol  (ESTRACE ) 0.1 MG/GM vaginal cream 1 gram vaginally twice weekly and apply a small amount of cream topically to urethra at the same time (Patient taking differently: Place 1 Applicatorful vaginally 2 (two) times a week. 1 gram vaginally Mondays & Thursdays at night. Apply a small amount of cream topically to urethra at the same time) 42.5 g 2   fluticasone (FLONASE) 50 MCG/ACT nasal spray Place 1 spray into both nostrils daily.     guaiFENesin (MUCINEX) 600 MG 12 hr tablet Take 300 mg by mouth 2 (two) times daily as needed (congestion/cough).     hydrochlorothiazide  (HYDRODIURIL ) 25 MG tablet Take 1 tablet (25 mg total) by mouth daily. 90 tablet 3   rosuvastatin  (CRESTOR ) 20 MG tablet Take 1 tablet (20 mg total) by mouth daily. 90 tablet 3   triamcinolone  (KENALOG ) 0.025 % cream Apply 1 Application topically 2 (two) times daily. Apply to affected rash twice daily.  (Patient taking differently: Apply 1 Application topically 2 (two) times daily as needed (eczema).) 15 g 0   No current facility-administered medications on file prior to visit.    Allergies  Allergen Reactions   Allegra [Fexofenadine Hcl] Other (See Comments)    Depression, cryingspells   Biaxin [Clarithromycin] Diarrhea, Nausea And Vomiting and Other (See Comments)    Syncope episode   Erythromycin Other (See Comments)    GI upset   Lipitor [Atorvastatin ] Other (See Comments)    arthralgias  Norvasc  [Amlodipine ] Other (See Comments)    headache   Sulfa Antibiotics Nausea Only       Physical Exam Vitals requested from patient and listed below if patient had equipment and was able to obtain at home for this virtual visit: There were no vitals filed for this visit. Estimated body mass index is 23.6 kg/m as calculated from the following:   Height as of 09/14/23: 5' 3 (1.6 m).   Weight as of 09/14/23: 133 lb 3.2 oz (60.4 kg).  EKG (optional): deferred due to virtual visit  GENERAL: alert, oriented, no acute distress detected, full vision exam deferred due to pandemic and/or virtual encounter   HEENT: atraumatic, conjunttiva clear, no obvious abnormalities on inspection of external nose and ears  NECK: normal movements of the head and neck  LUNGS: on inspection no signs of respiratory distress, breathing rate appears normal, no obvious gross SOB, gasping or wheezing  CV: no obvious cyanosis  MS: moves all visible extremities without noticeable abnormality  PSYCH/NEURO: pleasant and cooperative, no obvious depression or anxiety, speech and thought processing grossly intact, Cognitive function grossly intact  Flowsheet Row Video Visit from 01/25/2023 in [facility] [place]  PHQ-9 Total Score 2        04/03/2024    4:24 PM 01/25/2023    9:22 AM 08/09/2022    4:05 PM 07/12/2022    9:56 AM 01/16/2022    4:10 PM  Depression screen PHQ 2/9   Decreased Interest 0 0 0 0 0  Down, Depressed, Hopeless 0 0 0 0 0  PHQ - 2 Score 0 0 0 0 0  Altered sleeping  0 1    Tired, decreased energy  2 1    Change in appetite  0 0    Feeling bad or failure about yourself   0 0    Trouble concentrating  0 0    Moving slowly or fidgety/restless  0 0    Suicidal thoughts  0 0    PHQ-9 Score  2 2    Difficult doing work/chores  Not difficult at all Not difficult at all         01/16/2022    4:09 PM 09/14/2022    8:02 AM 01/25/2023    9:24 AM 09/10/2023    9:19 AM 04/03/2024    4:24 PM  Fall Risk  Falls in the past year? 1 1 1 1  0  Was there an injury with Fall? 0 0 0 0 0  Fall Risk Category Calculator 2 1 1 1   0  Fall Risk Category (Retired) Moderate  Low      (RETIRED) Patient Fall Risk Level Low fall risk       Patient at Risk for Falls Due to Medication side effect  No Fall Risks    Fall risk Follow up Falls evaluation completed;Education provided;Falls prevention discussed   Falls evaluation completed  Falls evaluation completed     Patient-reported   Data saved with a previous flowsheet row definition     SUMMARY AND PLAN:  Encounter for Medicare annual wellness exam  Discussed applicable health maintenance/preventive health measures and advised and referred or ordered per patient preferences: -discussed vaccines due recs/risks - she plans to get at the pharmacy -she plans to do bone density with Dr. Cleotilde this summer Health Maintenance  Topic Date Due   Zoster Vaccines- Shingrix  (2 of 2) 07/24/2018   COVID-19 Vaccine (7 - 2024-25 season) 05/27/2023   INFLUENZA  VACCINE  04/25/2024   MAMMOGRAM  12/25/2024   Medicare Annual Wellness (AWV)  04/03/2025   Colonoscopy  01/22/2028   DTaP/Tdap/Td (3 - Td or Tdap) 06/03/2029   Pneumococcal Vaccine: 50+ Years  Completed   DEXA SCAN  Completed   Hepatitis C Screening  Completed   Hepatitis B Vaccines  Aged Out   HPV VACCINES  Aged Out   Meningococcal B Vaccine  Aged Out       Education and counseling on the following was provided based on the above review of health and a plan/checklist for the patient, along with additional information discussed, was provided for the patient in the patient instructions :  -Advised and counseled on a healthy lifestyle - including the importance of a healthy diet, regular physical activity, social connections and stress management. -Reviewed patient's current diet. Advised and counseled on a whole foods based healthy diet. Congratulated on healthy choices - she cut out diet soda since last year! Bridget Flynn.  A summary of a healthy diet was provided in the Patient Instructions.  -reviewed patient's current physical activity level and discussed exercise guidelines for adults. Congratulated on healthy habits. Provided  safe balance exercises that can be done at home to improve balance and discussed exercise guidelines for adults - see patient instructions. Further resources  provided in patient instructions.  -Advise yearly dental visits at minimum and regular eye exams   Follow up: see patient instructions     Patient Instructions  I really enjoyed getting to talk with you today! I am available on Tuesdays and Thursdays for virtual visits if you have any questions or concerns, or if I can be of any further assistance.   CHECKLIST FROM ANNUAL WELLNESS VISIT:  -Follow up (please call to schedule if not scheduled after visit):   -yearly for annual wellness visit with primary care office  Here is a list of your preventive care/health maintenance measures and the plan for each if any are due:  PLAN For any measures below that may be due:    1. Due for bone density. If you need us  to order at any point, please let us  know. If you get it with Dr. Cleotilde, please have her office send a copy to Dr. Micheal. Thank you.   2. Can get vaccines at the pharmacy. Please let us  know if you do so that we can update your chart.  Health  Maintenance  Topic Date Due   Zoster Vaccines- Shingrix  (2 of 2) 07/24/2018   COVID-19 Vaccine (7 - 2024-25 season) 05/27/2023   INFLUENZA VACCINE  04/25/2024   MAMMOGRAM  12/25/2024   Medicare Annual Wellness (AWV)  04/03/2025   Colonoscopy  01/22/2028   DTaP/Tdap/Td (3 - Td or Tdap) 06/03/2029   Pneumococcal Vaccine: 50+ Years  Completed   DEXA SCAN  Completed   Hepatitis C Screening  Completed   Hepatitis B Vaccines  Aged Out   HPV VACCINES  Aged Out   Meningococcal B Vaccine  Aged Out    -See a dentist at least yearly  -Get your eyes checked and then per your eye specialist's recommendations  -Other issues addressed today:   -I have included below further information regarding a healthy whole foods based diet, physical activity guidelines for adults, stress management and opportunities for social connections. I hope you find this information useful.   -----------------------------------------------------------------------------------------------------------------------------------------------------------------------------------------------------------------------------------------------------------    NUTRITION: -eat real food: lots of colorful vegetables (half the plate) and fruits -5-7 servings of vegetables and fruits  per day (fresh or steamed is best), exp. 2 servings of vegetables with lunch and dinner and 2 servings of fruit per day. Berries and greens such as kale and collards are great choices.  -consume on a regular basis:  fresh fruits, fresh veggies, fish, nuts, seeds, healthy oils (such as olive oil, avocado oil), whole grains (make sure for bread/pasta/crackers/etc., that the first ingredient on label contains the word whole), legumes. -can eat small amounts of dairy and lean meat (no larger than the palm of your hand), but avoid processed meats such as ham, bacon, lunch meat, etc. -drink water -try to avoid fast food and pre-packaged foods, processed meat,  ultra processed foods/beverages (donuts, candy, etc.) -most experts advise limiting sodium to < 2300mg  per day, should limit further is any chronic conditions such as high blood pressure, heart disease, diabetes, etc. The American Heart Association advised that < 1500mg  is is ideal -try to avoid foods/beverages that contain any ingredients with names you do not recognize  -try to avoid foods/beverages  with added sugar or sweeteners/sweets  -try to avoid sweet drinks (including diet drinks): soda, juice, Gatorade, sweet tea, power drinks, diet drinks -try to avoid white rice, white bread, pasta (unless whole grain)  EXERCISE GUIDELINES FOR ADULTS: -if you wish to increase your physical activity, do so gradually and with the approval of your doctor -STOP and seek medical care immediately if you have any chest pain, chest discomfort or trouble breathing when starting or increasing exercise  -move and stretch your body, legs, feet and arms when sitting for long periods -Physical activity guidelines for optimal health in adults: -get at least 150 minutes per week of moderate exercise (can talk, but not sing); this is about 20-30 minutes of sustained activity 5-7 days per week or two 10-15 minute episodes of sustained activity 5-7 days per week -do some muscle building/resistance training/strength training at least 2 days per week  -balance exercises 3+ days per week:   Stand somewhere where you have something sturdy to hold onto if you lose balance    1) lift up on toes, then back down, start with 5x per day and work up to 20x   2) stand and lift one leg straight out to the side so that foot is a few inches of the floor, start with 5x each side and work up to 20x each side   3) stand on one foot, start with 5 seconds each side and work up to 20 seconds on each side  If you need ideas or help with getting more active:  -Silver sneakers https://tools.silversneakers.com  -Walk with a  Doc: http://www.duncan-williams.com/  -try to include resistance (weight lifting/strength building) and balance exercises twice per week: or the following link for ideas: http://castillo-powell.com/  BuyDucts.dk  STRESS MANAGEMENT: -can try meditating, or just sitting quietly with deep breathing while intentionally relaxing all parts of your body for 5 minutes daily -if you need further help with stress, anxiety or depression please follow up with your primary doctor or contact the wonderful folks at [facility]: [phone]  SOCIAL CONNECTIONS: -options in [place] if you wish to engage in more social and exercise related activities:  -Silver sneakers https://tools.silversneakers.com  -Walk with a Doc: http://www.duncan-williams.com/  -Check out the [facility] Active Adults 50+ section on the [place] of [employer] (hiking clubs, book clubs, cards and games, chess, exercise classes, aquatic classes and much more) - see the website for details: https://www.[place].gov/departments/parks-recreation/active-adults50  -YouTube has lots of exercise videos  for different ages and abilities as well  -[facility] (a variety of indoor and outdoor inperson activities for adults). [phone]. [address]  -Virtual Online Classes (a variety of topics): see seniorplanet.org or call [phone]  -consider volunteering at a school, hospice center, church, senior center or elsewhere            Chiquita JONELLE Cramp, DO

## 2024-04-03 NOTE — Patient Instructions (Signed)
 I really enjoyed getting to talk with you today! I am available on Tuesdays and Thursdays for virtual visits if you have any questions or concerns, or if I can be of any further assistance.   CHECKLIST FROM ANNUAL WELLNESS VISIT:  -Follow up (please call to schedule if not scheduled after visit):   -yearly for annual wellness visit with primary care office  Here is a list of your preventive care/health maintenance measures and the plan for each if any are due:  PLAN For any measures below that may be due:    1. Due for bone density. If you need us  to order at any point, please let us  know. If you get it with Dr. Cleotilde, please have her office send a copy to Dr. Micheal. Thank you.   2. Can get vaccines at the pharmacy. Please let us  know if you do so that we can update your chart.  Health Maintenance  Topic Date Due   Zoster Vaccines- Shingrix  (2 of 2) 07/24/2018   COVID-19 Vaccine (7 - 2024-25 season) 05/27/2023   INFLUENZA VACCINE  04/25/2024   MAMMOGRAM  12/25/2024   Medicare Annual Wellness (AWV)  04/03/2025   Colonoscopy  01/22/2028   DTaP/Tdap/Td (3 - Td or Tdap) 06/03/2029   Pneumococcal Vaccine: 50+ Years  Completed   DEXA SCAN  Completed   Hepatitis C Screening  Completed   Hepatitis B Vaccines  Aged Out   HPV VACCINES  Aged Out   Meningococcal B Vaccine  Aged Out    -See a dentist at least yearly  -Get your eyes checked and then per your eye specialist's recommendations  -Other issues addressed today:   -I have included below further information regarding a healthy whole foods based diet, physical activity guidelines for adults, stress management and opportunities for social connections. I hope you find this information useful.    -----------------------------------------------------------------------------------------------------------------------------------------------------------------------------------------------------------------------------------------------------------    NUTRITION: -eat real food: lots of colorful vegetables (half the plate) and fruits -5-7 servings of vegetables and fruits per day (fresh or steamed is best), exp. 2 servings of vegetables with lunch and dinner and 2 servings of fruit per day. Berries and greens such as kale and collards are great choices.  -consume on a regular basis:  fresh fruits, fresh veggies, fish, nuts, seeds, healthy oils (such as olive oil, avocado oil), whole grains (make sure for bread/pasta/crackers/etc., that the first ingredient on label contains the word whole), legumes. -can eat small amounts of dairy and lean meat (no larger than the palm of your hand), but avoid processed meats such as ham, bacon, lunch meat, etc. -drink water -try to avoid fast food and pre-packaged foods, processed meat, ultra processed foods/beverages (donuts, candy, etc.) -most experts advise limiting sodium to < 2300mg  per day, should limit further is any chronic conditions such as high blood pressure, heart disease, diabetes, etc. The American Heart Association advised that < 1500mg  is is ideal -try to avoid foods/beverages that contain any ingredients with names you do not recognize  -try to avoid foods/beverages  with added sugar or sweeteners/sweets  -try to avoid sweet drinks (including diet drinks): soda, juice, Gatorade, sweet tea, power drinks, diet drinks -try to avoid white rice, white bread, pasta (unless whole grain)  EXERCISE GUIDELINES FOR ADULTS: -if you wish to increase your physical activity, do so gradually and with the approval of your doctor -STOP and seek medical care immediately if you have any chest pain, chest discomfort or trouble breathing when starting or  increasing exercise  -move and stretch your body, legs, feet and arms when sitting for long periods -Physical activity guidelines for optimal health in adults: -get at least 150 minutes per week of moderate exercise (can talk, but not sing); this is about 20-30 minutes of sustained activity 5-7 days per week or two 10-15 minute episodes of sustained activity 5-7 days per week -do some muscle building/resistance training/strength training at least 2 days per week  -balance exercises 3+ days per week:   Stand somewhere where you have something sturdy to hold onto if you lose balance    1) lift up on toes, then back down, start with 5x per day and work up to 20x   2) stand and lift one leg straight out to the side so that foot is a few inches of the floor, start with 5x each side and work up to 20x each side   3) stand on one foot, start with 5 seconds each side and work up to 20 seconds on each side  If you need ideas or help with getting more active:  -Silver sneakers https://tools.silversneakers.com  -Walk with a Doc: http://www.duncan-williams.com/  -try to include resistance (weight lifting/strength building) and balance exercises twice per week: or the following link for ideas: http://castillo-powell.com/  BuyDucts.dk  STRESS MANAGEMENT: -can try meditating, or just sitting quietly with deep breathing while intentionally relaxing all parts of your body for 5 minutes daily -if you need further help with stress, anxiety or depression please follow up with your primary doctor or contact the wonderful folks at [facility]: [phone]  SOCIAL CONNECTIONS: -options in [place] if you wish to engage in more social and exercise related activities:  -Silver sneakers https://tools.silversneakers.com  -Walk with a Doc: http://www.duncan-williams.com/  -Check out the [facility] Active Adults 50+  section on the [place] [employer] (hiking clubs, book clubs, cards and games, chess, exercise classes, aquatic classes and much more) - see the website for details: https://www.-[place].gov/departments/parks-recreation/active-adults50  -YouTube has lots of exercise videos for different ages and abilities as well  -[facility] (a variety of indoor and outdoor inperson activities for adults). [phone]. [address]  -Virtual Online Classes (a variety of topics): see seniorplanet.org or call ([phone])548-3930  -consider volunteering at a school, hospice center, church, senior center or elsewhere

## 2024-04-11 ENCOUNTER — Encounter (HOSPITAL_BASED_OUTPATIENT_CLINIC_OR_DEPARTMENT_OTHER): Payer: Self-pay | Admitting: Obstetrics & Gynecology

## 2024-04-11 ENCOUNTER — Other Ambulatory Visit (HOSPITAL_COMMUNITY)
Admission: RE | Admit: 2024-04-11 | Discharge: 2024-04-11 | Disposition: A | Discharge status: Home / Self Care | Source: Ambulatory Visit | Attending: Obstetrics & Gynecology | Admitting: Obstetrics & Gynecology

## 2024-04-11 ENCOUNTER — Ambulatory Visit (HOSPITAL_BASED_OUTPATIENT_CLINIC_OR_DEPARTMENT_OTHER): Payer: Medicare Other | Admitting: Obstetrics & Gynecology

## 2024-04-11 VITALS — BP 139/70 | HR 61 | Wt 137.0 lb

## 2024-04-11 DIAGNOSIS — N368 Other specified disorders of urethra: Secondary | ICD-10-CM

## 2024-04-11 DIAGNOSIS — Z124 Encounter for screening for malignant neoplasm of cervix: Secondary | ICD-10-CM | POA: Insufficient documentation

## 2024-04-11 DIAGNOSIS — Z01419 Encounter for gynecological examination (general) (routine) without abnormal findings: Secondary | ICD-10-CM | POA: Diagnosis not present

## 2024-04-11 DIAGNOSIS — M8588 Other specified disorders of bone density and structure, other site: Secondary | ICD-10-CM | POA: Diagnosis not present

## 2024-04-11 MED ORDER — ESTRADIOL 0.1 MG/GM VA CREA
TOPICAL_CREAM | VAGINAL | 2 refills | Status: AC
Start: 2024-04-11 — End: ?

## 2024-04-11 NOTE — Progress Notes (Signed)
 Breast and Pelvic Exam Patient name: Bridget Flynn MRN [id number]  Date of birth: June 13, 1956 Chief Complaint:   Breast and Pelvic Exam  History of Present Illness:   Bridget Flynn is a 68 y.o. G22P2012 Caucasian female being seen today for breast and pelvic exam.  Denies vaginal.  Doing well.  Has had sinus surgery since I saw her last.  H/o osteopenia.  H/o urethral prolapse.  Using vaginal estradiol  cream.  Her symptoms of feeling like she was having prolapse are much better.  Doing kegels and exercising more too.  Pap smear guidelines reveiwed.  She does want a pap smear today.    Patient's last menstrual period was 10/26/2005.   Last pap 02/2021. Results were: negative. H/O abnormal pap: no Last mammogram: 12/26/2023. Results were: normal. Family h/o breast cancer: no Last colonoscopy: 01/21/2018. Results were: normal. Family h/o colorectal cancer: no Dexa:  2022.  T score -1.4     04/03/2024    4:24 PM 01/25/2023    9:22 AM 08/09/2022    4:05 PM 07/12/2022    9:56 AM 01/16/2022    4:10 PM  Depression screen PHQ 2/9  Decreased Interest 0 0 0 0 0  Down, Depressed, Hopeless 0 0 0 0 0  PHQ - 2 Score 0 0 0 0 0  Altered sleeping  0 1    Tired, decreased energy  2 1    Change in appetite  0 0    Feeling bad or failure about yourself   0 0    Trouble concentrating  0 0    Moving slowly or fidgety/restless  0 0    Suicidal thoughts  0 0    PHQ-9 Score  2 2    Difficult doing work/chores  Not difficult at all Not difficult at all      Review of Systems:   Pertinent items are noted in HPI Denies any urinary or bowel changes or pelvic pain Pertinent History Reviewed:  Reviewed past medical,surgical, social and family history.  Reviewed problem list, medications and allergies. Physical Assessment:   Vitals:   04/11/24 0816  BP: 139/70  Pulse: 61  SpO2: 98%  Weight: 137 lb (62.1 kg)  Body mass index is 24.27 kg/m.        Physical Examination:   General appearance - well  appearing, and in no distress  Mental status - alert, oriented to person, place, and time  Psych:  She has a normal mood and affect  Skin - warm and dry, normal color, no suspicious lesions noted  Chest - effort normal, all lung fields clear to auscultation bilaterally  Heart - normal rate and regular rhythm  Neck:  midline trachea, no thyromegaly or nodules  Breasts - breasts appear normal, no suspicious masses, no skin or nipple changes or  axillary nodes  Abdomen - soft, nontender, nondistended, no masses or organomegaly  Pelvic - VULVA: normal appearing vulva with no masses, tenderness or lesions   VAGINA: normal appearing vagina with normal color and discharge, no lesions   CERVIX: normal appearing cervix without discharge or lesions, no CMT  Thin prep pap is done today  UTERUS: uterus is felt to be normal size, shape, consistency and nontender   ADNEXA: No adnexal masses or tenderness noted.  Rectal - normal rectal, good sphincter tone, no masses felt.  Extremities:  No swelling or varicosities noted  Chaperone present for exam  No results found for this or any previous visit (from  the past 24 hours).  Assessment & Plan:  1. Encntr for gyn exam (general) (routine) w/o abn findings (Primary) - Pap smear updated today - Mammogram 12/28/2023 - Colonoscopy 2019.  F/u 10 years. - Bone mineral density 2022.  Order placed today due to hand fracture since I saw her last. - lab work done with PCP, Dr. Micheal - vaccines reviewed/updated  2. Osteopenia of lumbar spine - DG Bone Density; Future  3. Urethral prolapse - estradiol  (ESTRACE ) 0.1 MG/GM vaginal cream; 1 gram vaginally twice weekly and apply a small amount of cream topically to urethra at the same time  Dispense: 42.5 g; Refill: 2  4. Cervical cancer screening - Cytology - PAP( [place]) - PR OBTAINING SCREEN PAP SMEAR   Orders Placed This Encounter  Procedures   DG Bone Density   PR OBTAINING SCREEN PAP SMEAR     Meds:  Meds ordered this encounter  Medications   estradiol  (ESTRACE ) 0.1 MG/GM vaginal cream    Sig: 1 gram vaginally twice weekly and apply a small amount of cream topically to urethra at the same time    Dispense:  42.5 g    Refill:  2    Follow-up: Return for 1-2 years or for new issues/concerns.  Ronal GORMAN [place], MD 04/11/2024 8:39 AM

## 2024-04-16 LAB — CYTOLOGY - PAP: Diagnosis: NEGATIVE

## 2024-04-17 ENCOUNTER — Ambulatory Visit (HOSPITAL_BASED_OUTPATIENT_CLINIC_OR_DEPARTMENT_OTHER): Payer: Self-pay | Admitting: Obstetrics & Gynecology

## 2024-08-27 ENCOUNTER — Ambulatory Visit: Admitting: Family Medicine

## 2024-08-27 ENCOUNTER — Encounter: Payer: Self-pay | Admitting: Family Medicine

## 2024-08-27 VITALS — BP 138/78 | HR 67 | Temp 98.4°F | Wt 135.5 lb

## 2024-08-27 DIAGNOSIS — J019 Acute sinusitis, unspecified: Secondary | ICD-10-CM | POA: Diagnosis not present

## 2024-08-27 MED ORDER — AMOXICILLIN-POT CLAVULANATE 875-125 MG PO TABS: 1.0000 | ORAL_TABLET | Freq: Two times a day (BID) | ORAL | 0 refills | Status: DC

## 2024-08-27 NOTE — Progress Notes (Signed)
 Established Patient Office Visit  Subjective   Patient ID: Bridget Flynn, female    DOB: September 07, 1956  Age: 68 y.o. MRN: [id number]  Chief Complaint  Patient presents with   Sinusitis    HPI   Gaelle is seen with sinusitis type symptoms off and on for 2 months.  She feels like her symptoms were improving a few weeks ago then had a relapse.  She has had some bilateral frontal and maxillary sinus pressure and pain.  She does some intermittent bloody and greenish-yellowish discharge.  She had sinus surgery back in May 2024 and had been doing well until recent bout.  Has had some intermittent headaches and increased malaise.  Some productive cough.  Past Medical History:  Diagnosis Date   ALLERGIC RHINITIS 11/25/2007   Qualifier: Diagnosis of  By: Joshua CNA/MA, Jessica     Allergy    Asthma    Headache    HYPERLIPIDEMIA 07/12/2009   Qualifier: Diagnosis of  By: Lavinia LPN, Nancy     Hypertension    Microscopic hematuria    negative urology evaluation   Osteopenia    VITAMIN D  DEFICIENCY 07/12/2009   Qualifier: Diagnosis of  By: Lavinia LPN, Inocente     Past Surgical History:  Procedure Laterality Date   COLONOSCOPY W/ POLYPECTOMY  2004   DILATION AND CURETTAGE OF UTERUS     ENDOSCOPIC TURBINATE REDUCTION Bilateral 01/26/2023   Procedure: ENDOSCOPIC TURBINATE REDUCTION;  Surgeon: Llewellyn Gerard LABOR, DO;  Location: [facility] OR;  Service: ENT;  Laterality: Bilateral;   LAPAROSCOPIC BILATERAL SALPINGECTOMY  08/24/08   SINUS ENDO WITH FUSION Bilateral 01/26/2023   Procedure: SINUS ENDO WITH FUSION;  Surgeon: Llewellyn Gerard LABOR, DO;  Location: [facility] OR;  Service: ENT;  Laterality: Bilateral;    reports that she has never smoked. She has never used smokeless tobacco. She reports that she does not currently use alcohol. She reports that she does not use drugs. family history includes Cancer in her father, mother, and another family member; Cirrhosis in her mother; Diabetes in her father, mother,  and another family member. Allergies  Allergen Reactions   Allegra [Fexofenadine Hcl] Other (See Comments)    Depression, cryingspells   Biaxin [Clarithromycin] Diarrhea, Nausea And Vomiting and Other (See Comments)    Syncope episode   Erythromycin Other (See Comments)    GI upset   Lipitor [Atorvastatin ] Other (See Comments)    arthralgias   Norvasc  [Amlodipine ] Other (See Comments)    headache   Sulfa Antibiotics Nausea Only    Review of Systems  Constitutional:  Positive for malaise/fatigue. Negative for chills and fever.  HENT:  Positive for congestion and sinus pain.   Neurological:  Positive for headaches.      Objective:     BP 138/78 (BP Location: Left Arm, Cuff Size: Normal)   Pulse 67   Temp 98.4 F (36.9 C) (Oral)   Wt 135 lb 8 oz (61.5 kg)   LMP 10/26/2005   SpO2 95%   BMI 24.00 kg/m  BP Readings from Last 3 Encounters:  08/27/24 138/78  04/11/24 139/70  09/14/23 138/80   Wt Readings from Last 3 Encounters:  08/27/24 135 lb 8 oz (61.5 kg)  04/11/24 137 lb (62.1 kg)  09/14/23 133 lb 3.2 oz (60.4 kg)      Physical Exam Vitals reviewed.  Constitutional:      General: She is not in acute distress.    Appearance: She is not ill-appearing.  HENT:  Right Ear: Tympanic membrane normal.     Left Ear: Tympanic membrane normal.     Nose:     Comments: She has a little bit of crusted blood right anterior naris.  No visible purulent discharge.  No visible polyps.    Mouth/Throat:     Mouth: Mucous membranes are moist.     Pharynx: Oropharynx is clear. No oropharyngeal exudate.  Cardiovascular:     Rate and Rhythm: Normal rate and regular rhythm.  Pulmonary:     Effort: Pulmonary effort is normal.     Breath sounds: Normal breath sounds.  Musculoskeletal:     Cervical back: Neck supple.  Lymphadenopathy:     Cervical: No cervical adenopathy.  Neurological:     Mental Status: She is alert.      No results found for any visits on  08/27/24.    The 10-year ASCVD risk score (Arnett DK, et al., 2019) is: 11.8%    Assessment & Plan:   Acute sinusitis.  She has had history of previous sinus surgery.  Symptoms now for 2 months intermittently.  Start Augmentin  875 mg twice daily for 10 days.  Plenty of fluids.  Touch base in 1 to 2 weeks if no improvement  Wolm Scarlet, MD

## 2024-09-07 ENCOUNTER — Encounter: Payer: Self-pay | Admitting: Family Medicine

## 2024-09-08 ENCOUNTER — Other Ambulatory Visit: Payer: Self-pay | Admitting: Family Medicine

## 2024-09-08 MED ORDER — ROSUVASTATIN CALCIUM 20 MG PO TABS: 20.0000 mg | ORAL_TABLET | Freq: Every day | ORAL | 0 refills | Status: AC

## 2024-09-08 MED ORDER — HYDROCHLOROTHIAZIDE 25 MG PO TABS: 25.0000 mg | ORAL_TABLET | Freq: Every day | ORAL | 0 refills | Status: AC

## 2024-09-15 ENCOUNTER — Encounter: Payer: Self-pay | Admitting: Family Medicine

## 2024-09-15 DIAGNOSIS — R059 Cough, unspecified: Secondary | ICD-10-CM

## 2024-09-17 MED ORDER — AMOXICILLIN-POT CLAVULANATE 875-125 MG PO TABS: 1.0000 | ORAL_TABLET | Freq: Two times a day (BID) | ORAL | 0 refills | Status: DC

## 2024-09-24 ENCOUNTER — Ambulatory Visit: Admitting: Family Medicine

## 2024-10-01 ENCOUNTER — Encounter: Payer: Self-pay | Admitting: Family Medicine

## 2024-10-01 ENCOUNTER — Ambulatory Visit: Admitting: Family Medicine

## 2024-10-01 VITALS — BP 146/66 | HR 63 | Temp 97.7°F | Wt 135.1 lb

## 2024-10-01 DIAGNOSIS — R5383 Other fatigue: Secondary | ICD-10-CM

## 2024-10-01 DIAGNOSIS — R7303 Prediabetes: Secondary | ICD-10-CM

## 2024-10-01 DIAGNOSIS — R519 Headache, unspecified: Secondary | ICD-10-CM

## 2024-10-01 DIAGNOSIS — E559 Vitamin D deficiency, unspecified: Secondary | ICD-10-CM

## 2024-10-01 DIAGNOSIS — I10 Essential (primary) hypertension: Secondary | ICD-10-CM

## 2024-10-01 LAB — CBC WITH DIFFERENTIAL/PLATELET
Basophils Absolute: 0 K/uL (ref 0.0–0.1)
Basophils Relative: 0.6 % (ref 0.0–3.0)
Eosinophils Absolute: 0.1 K/uL (ref 0.0–0.7)
Eosinophils Relative: 1.5 % (ref 0.0–5.0)
HCT: 40.5 % (ref 36.0–46.0)
Hemoglobin: 13.7 g/dL (ref 12.0–15.0)
Lymphocytes Relative: 35.3 % (ref 12.0–46.0)
Lymphs Abs: 1.9 K/uL (ref 0.7–4.0)
MCHC: 33.7 g/dL (ref 30.0–36.0)
MCV: 84.1 fl (ref 78.0–100.0)
Monocytes Absolute: 0.6 K/uL (ref 0.1–1.0)
Monocytes Relative: 11.3 % (ref 3.0–12.0)
Neutro Abs: 2.8 K/uL (ref 1.4–7.7)
Neutrophils Relative %: 51.3 % (ref 43.0–77.0)
Platelets: 386 K/uL (ref 150.0–400.0)
RBC: 4.82 Mil/uL (ref 3.87–5.11)
RDW: 12.8 % (ref 11.5–15.5)
WBC: 5.5 K/uL (ref 4.0–10.5)

## 2024-10-01 LAB — COMPREHENSIVE METABOLIC PANEL WITH GFR
ALT: 20 U/L (ref 3–35)
AST: 19 U/L (ref 5–37)
Albumin: 4.7 g/dL (ref 3.5–5.2)
Alkaline Phosphatase: 45 U/L (ref 39–117)
BUN: 16 mg/dL (ref 6–23)
CO2: 35 meq/L — ABNORMAL HIGH (ref 19–32)
Calcium: 9.7 mg/dL (ref 8.4–10.5)
Chloride: 89 meq/L — ABNORMAL LOW (ref 96–112)
Creatinine, Ser: 0.54 mg/dL (ref 0.40–1.20)
GFR: 93.51 mL/min
Glucose, Bld: 94 mg/dL (ref 70–99)
Potassium: 3.2 meq/L — ABNORMAL LOW (ref 3.5–5.1)
Sodium: 130 meq/L — ABNORMAL LOW (ref 135–145)
Total Bilirubin: 0.5 mg/dL (ref 0.2–1.2)
Total Protein: 7.8 g/dL (ref 6.0–8.3)

## 2024-10-01 LAB — TSH: TSH: 2.02 u[IU]/mL (ref 0.35–5.50)

## 2024-10-01 LAB — HEMOGLOBIN A1C: Hgb A1c MFr Bld: 5.8 % (ref 4.6–6.5)

## 2024-10-01 LAB — VITAMIN D 25 HYDROXY (VIT D DEFICIENCY, FRACTURES): VITD: 22.2 ng/mL — ABNORMAL LOW (ref 30.00–100.00)

## 2024-10-01 NOTE — Progress Notes (Signed)
 "  Established Patient Office Visit  Subjective   Patient ID: Bridget Flynn, female    DOB: 06/28/1956  Age: 69 y.o. MRN: [id number]  Chief Complaint  Patient presents with   Sinusitis   Cough   Fatigue   Headache    HPI   Bridget Flynn has history of recurrent sinusitis and underwent sinus surgery in May 2024.  She has been battling for months now sinus symptoms intermittently.  She was seen here December 3 and treated with Augmentin .  Symptoms improved slightly but then she has had recurrence.  She went back to ENT on the 30th and was given doxycycline  but only took this for 5 doses and had some side effects.  She has been taking Mucinex regularly.  Her bigger concern is that she is having some ongoing fatigue and really daily headaches since Thanksgiving.  Headaches started out bilateral frontal and now occipital and bilateral.  There are times when her balance feels off especially when looking upward.  No consistent vertigo symptoms.  No reported fever.  No bloody nasal discharge.  She has been alternating with Tylenol  and ibuprofen for headaches which provide some relief.  Current headache is 3 out of 10 intensity.  She has follow-up with her ENT surgeon next week.  Denies any chest pains or significant shortness of breath.  Has had occasional cough.  She does take HCTZ for hypertension and is overdue for follow-up electrolytes.  Also has history of vitamin D  deficiency and is requesting follow-up vitamin D  level.  She has had prediabetes by most recent A1c of 5.8  Past Medical History:  Diagnosis Date   ALLERGIC RHINITIS 11/25/2007   Qualifier: Diagnosis of  By: Joshua CNA/MA, Jessica     Allergy    Asthma    Headache    HYPERLIPIDEMIA 07/12/2009   Qualifier: Diagnosis of  By: Lavinia LPN, Nancy     Hypertension    Microscopic hematuria    negative urology evaluation   Osteopenia    VITAMIN D  DEFICIENCY 07/12/2009   Qualifier: Diagnosis of  By: Lavinia LPN, Inocente     Past Surgical  History:  Procedure Laterality Date   COLONOSCOPY W/ POLYPECTOMY  2004   DILATION AND CURETTAGE OF UTERUS     ENDOSCOPIC TURBINATE REDUCTION Bilateral 01/26/2023   Procedure: ENDOSCOPIC TURBINATE REDUCTION;  Surgeon: Llewellyn Gerard LABOR, DO;  Location: [facility] OR;  Service: ENT;  Laterality: Bilateral;   LAPAROSCOPIC BILATERAL SALPINGECTOMY  08/24/08   SINUS ENDO WITH FUSION Bilateral 01/26/2023   Procedure: SINUS ENDO WITH FUSION;  Surgeon: Llewellyn Gerard LABOR, DO;  Location: [facility] OR;  Service: ENT;  Laterality: Bilateral;    reports that she has never smoked. She has never used smokeless tobacco. She reports that she does not currently use alcohol. She reports that she does not use drugs. family history includes Cancer in her father, mother, and another family member; Cirrhosis in her mother; Diabetes in her father, mother, and another family member. Allergies[1]  Review of Systems  Constitutional:  Positive for malaise/fatigue. Negative for chills and fever.  Eyes:  Negative for blurred vision.  Respiratory:  Negative for shortness of breath.   Cardiovascular:  Negative for chest pain.  Gastrointestinal:  Negative for abdominal pain.  Genitourinary:  Negative for dysuria.  Neurological:  Positive for headaches. Negative for focal weakness and weakness.      Objective:     BP (!) 146/66   Pulse 63   Temp 97.7 F (36.5 C) (Oral)  Wt 135 lb 1.6 oz (61.3 kg)   LMP 10/26/2005   SpO2 98%   BMI 23.93 kg/m  BP Readings from Last 3 Encounters:  10/01/24 (!) 146/66  08/27/24 138/78  04/11/24 139/70   Wt Readings from Last 3 Encounters:  10/01/24 135 lb 1.6 oz (61.3 kg)  08/27/24 135 lb 8 oz (61.5 kg)  04/11/24 137 lb (62.1 kg)      Physical Exam Vitals reviewed.  Constitutional:      General: She is not in acute distress.    Appearance: She is well-developed. She is not ill-appearing.  HENT:     Mouth/Throat:     Mouth: Mucous membranes are moist.     Pharynx: Oropharynx is  clear.  Eyes:     Pupils: Pupils are equal, round, and reactive to light.  Neck:     Thyroid : No thyromegaly.     Vascular: No JVD.  Cardiovascular:     Rate and Rhythm: Normal rate and regular rhythm.     Heart sounds:     No gallop.  Pulmonary:     Effort: Pulmonary effort is normal. No respiratory distress.     Breath sounds: Normal breath sounds. No wheezing or rales.  Musculoskeletal:     Cervical back: Neck supple.  Neurological:     Mental Status: She is alert.      No results found for any visits on 10/01/24.    The 10-year ASCVD risk score (Arnett DK, et al., 2019) is: 14.7%    Assessment & Plan:   Problem List Items Addressed This Visit       Unprioritized   Vitamin D  deficiency   Relevant Orders   VITAMIN D  25 Hydroxy (Vit-D Deficiency, Fractures)   Essential hypertension   Other Visit Diagnoses       Fatigue, unspecified type    -  Primary   Relevant Orders   CBC with Differential/Platelet   TSH   CMP     Prediabetes       Relevant Orders   Hemoglobin A1c     Noelle has history of recurrent sinusitis and had sinus surgery 2024.  She has been battling now several weeks of recurrent sinusitis symptoms and has scheduled follow-up with ENT surgeon next week.  Just finished recent course of Augmentin  and took 5 doses of doxycycline  before having side effects.  Her main concern now is pervasive fatigue and almost daily headaches.  -Check multiple follow-up labs as above.  Rule out metabolic cause for headaches such as hyponatremia or electrolyte disturbance. - Follow-up immediately for any fever or progressive headache - If labs above unrevealing and headaches persist may need to consider follow-up imaging with CAT scan or MRI to further assess.  Will wait till after her ENT visit next week to see if they decide on any further imaging of sinuses  No follow-ups on file.    Wolm Scarlet, MD     [1]  Allergies Allergen Reactions   Allegra  [Fexofenadine Hcl] Other (See Comments)    Depression, cryingspells   Biaxin [Clarithromycin] Diarrhea, Nausea And Vomiting and Other (See Comments)    Syncope episode   Erythromycin Other (See Comments)    GI upset   Lipitor [Atorvastatin ] Other (See Comments)    arthralgias   Norvasc  [Amlodipine ] Other (See Comments)    headache   Sulfa Antibiotics Nausea Only   "

## 2024-10-02 ENCOUNTER — Ambulatory Visit: Payer: Self-pay | Admitting: Family Medicine

## 2024-10-02 DIAGNOSIS — E876 Hypokalemia: Secondary | ICD-10-CM

## 2024-10-02 MED ORDER — VITAMIN D (ERGOCALCIFEROL) 1.25 MG (50000 UNIT) PO CAPS: 50000.0000 [IU] | ORAL_CAPSULE | ORAL | 1 refills | Status: AC

## 2024-10-02 MED ORDER — POTASSIUM CHLORIDE ER 10 MEQ PO TBCR: EXTENDED_RELEASE_TABLET | ORAL | 0 refills | Status: DC

## 2024-10-17 ENCOUNTER — Encounter: Payer: Self-pay | Admitting: Family Medicine

## 2024-10-17 ENCOUNTER — Ambulatory Visit: Payer: Self-pay | Admitting: Family Medicine

## 2024-10-17 ENCOUNTER — Ambulatory Visit: Admitting: Family Medicine

## 2024-10-17 VITALS — BP 134/70 | HR 78 | Temp 98.1°F | Wt 137.2 lb

## 2024-10-17 DIAGNOSIS — E559 Vitamin D deficiency, unspecified: Secondary | ICD-10-CM

## 2024-10-17 DIAGNOSIS — E871 Hypo-osmolality and hyponatremia: Secondary | ICD-10-CM

## 2024-10-17 DIAGNOSIS — E876 Hypokalemia: Secondary | ICD-10-CM | POA: Diagnosis not present

## 2024-10-17 DIAGNOSIS — I10 Essential (primary) hypertension: Secondary | ICD-10-CM | POA: Diagnosis not present

## 2024-10-17 LAB — BASIC METABOLIC PANEL WITH GFR
BUN: 17 mg/dL (ref 6–23)
CO2: 36 meq/L — ABNORMAL HIGH (ref 19–32)
Calcium: 9.9 mg/dL (ref 8.4–10.5)
Chloride: 93 meq/L — ABNORMAL LOW (ref 96–112)
Creatinine, Ser: 0.88 mg/dL (ref 0.40–1.20)
GFR: 66.73 mL/min
Glucose, Bld: 91 mg/dL (ref 70–99)
Potassium: 3.5 meq/L (ref 3.5–5.1)
Sodium: 134 meq/L — ABNORMAL LOW (ref 135–145)

## 2024-10-17 NOTE — Progress Notes (Signed)
 "  Established Patient Office Visit  Subjective   Patient ID: Bridget Flynn, female    DOB: May 12, 1956  Age: 69 y.o. MRN: [id number]  Chief Complaint  Patient presents with   Medical Management of Chronic Issues    HPI    Bridget Flynn is seen following visit here on the seventh.  She has longstanding history of recurrent sinusitis and had sinus surgery 2024.  And finished multiple recent courses of antibiotics and was having almost daily headaches.  She went back to see her ENT surgeon and they felt like her sinuses were very clear.  When she was here she was having significant fatigue issues and we obtained follow-up labs which showed multiple abnormalities including sodium 130, potassium 3.2, vitamin D  level of 22.  Patient on HCTZ.  She started potassium replacement and vitamin D  supplement with prescription vitamin D  50,000 international units weekly.  She feels much better at this time.  She feels like her headaches have almost completely gone .  She is trying to get good natural sources of sodium-for example, Brussels sprouts.  Eats very little processed food.  We had previously considered imaging of her brain with either CT or MRI if headaches were not improving but at this point she states they are significantly improved.  Past Medical History:  Diagnosis Date   ALLERGIC RHINITIS 11/25/2007   Qualifier: Diagnosis of  By: Joshua CNA/MA, Jessica     Allergy    Asthma    Headache    HYPERLIPIDEMIA 07/12/2009   Qualifier: Diagnosis of  By: Lavinia LPN, Nancy     Hypertension    Microscopic hematuria    negative urology evaluation   Osteopenia    VITAMIN D  DEFICIENCY 07/12/2009   Qualifier: Diagnosis of  By: Lavinia LPN, Inocente     Past Surgical History:  Procedure Laterality Date   COLONOSCOPY W/ POLYPECTOMY  2004   DILATION AND CURETTAGE OF UTERUS     ENDOSCOPIC TURBINATE REDUCTION Bilateral 01/26/2023   Procedure: ENDOSCOPIC TURBINATE REDUCTION;  Surgeon: Llewellyn Gerard LABOR, DO;   Location: [facility] OR;  Service: ENT;  Laterality: Bilateral;   LAPAROSCOPIC BILATERAL SALPINGECTOMY  08/24/08   SINUS ENDO WITH FUSION Bilateral 01/26/2023   Procedure: SINUS ENDO WITH FUSION;  Surgeon: Llewellyn Gerard LABOR, DO;  Location: [facility] OR;  Service: ENT;  Laterality: Bilateral;    reports that she has never smoked. She has never used smokeless tobacco. She reports that she does not currently use alcohol. She reports that she does not use drugs. family history includes Cancer in her father, mother, and another family member; Cirrhosis in her mother; Diabetes in her father, mother, and another family member. Allergies[1]  Review of Systems  Constitutional:  Negative for fever and malaise/fatigue.  Eyes:  Negative for blurred vision.  Respiratory:  Negative for shortness of breath.   Cardiovascular:  Negative for chest pain.  Neurological:  Negative for dizziness, weakness and headaches.      Objective:     BP 134/70   Pulse 78   Temp 98.1 F (36.7 C) (Oral)   Wt 137 lb 3.2 oz (62.2 kg)   LMP 10/26/2005   SpO2 98%   BMI 24.30 kg/m  BP Readings from Last 3 Encounters:  10/17/24 134/70  10/01/24 (!) 146/66  08/27/24 138/78   Wt Readings from Last 3 Encounters:  10/17/24 137 lb 3.2 oz (62.2 kg)  10/01/24 135 lb 1.6 oz (61.3 kg)  08/27/24 135 lb 8 oz (61.5 kg)  Physical Exam Vitals reviewed.  Constitutional:      General: She is not in acute distress.    Appearance: She is not ill-appearing.  Cardiovascular:     Rate and Rhythm: Normal rate and regular rhythm.  Pulmonary:     Effort: Pulmonary effort is normal.     Breath sounds: Normal breath sounds. No wheezing or rales.  Musculoskeletal:     Cervical back: Neck supple.      No results found for any visits on 10/17/24.  Last CBC Lab Results  Component Value Date   WBC 5.5 10/01/2024   HGB 13.7 10/01/2024   HCT 40.5 10/01/2024   MCV 84.1 10/01/2024   MCH 29.7 01/22/2023   RDW 12.8 10/01/2024   PLT  386.0 10/01/2024   Last metabolic panel Lab Results  Component Value Date   GLUCOSE 91 10/17/2024   NA 134 (L) 10/17/2024   K 3.5 10/17/2024   CL 93 (L) 10/17/2024   CO2 36 (H) 10/17/2024   BUN 17 10/17/2024   CREATININE 0.88 10/17/2024   GFR 66.73 10/17/2024   CALCIUM  9.9 10/17/2024   PROT 7.8 10/01/2024   ALBUMIN 4.7 10/01/2024   BILITOT 0.5 10/01/2024   ALKPHOS 45 10/01/2024   AST 19 10/01/2024   ALT 20 10/01/2024   ANIONGAP 11 01/22/2023   Last lipids Lab Results  Component Value Date   CHOL 151 09/14/2023   HDL 58.30 09/14/2023   LDLCALC 84 09/14/2023   LDLDIRECT 195.9 10/22/2013   TRIG 45.0 09/14/2023   CHOLHDL 3 09/14/2023      The [id number] ASCVD risk score (Arnett DK, et al., 2019) is: 12.5%    Assessment & Plan:   #1 recent fatigue and almost daily headaches.  Etiology not entirely clear.  Saw ENT and no evidence for recurrent sinusitis.  Possibly metabolic contribution.  She went on potassium replacement and started vitamin D  and states that headaches have almost completely gone away.  We have recommended against any brain imaging at this time since her headaches have improved.  #2 mild hypokalemia with recent potassium to 3.2.  Patient currently on replacement.  She is also try to get several good natural sources of potassium such as white beans.  Recheck basic metabolic panel today  #3 recent mild hyponatremia with sodium 130 probably related to thiazide use.  If sodium dropping consider discontinuation of HCTZ and look for other options for blood pressure  #4 low vitamin D .  Patient currently on 50,000 international units daily.  She inquired about follow-up labs.  Would recommend giving this at least 3 to 4 months before rechecking.  No follow-ups on file.    Wolm Scarlet, MD     [1]  Allergies Allergen Reactions   Allegra [Fexofenadine Hcl] Other (See Comments)    Depression, cryingspells   Biaxin [Clarithromycin] Diarrhea, Nausea And  Vomiting and Other (See Comments)    Syncope episode   Erythromycin Other (See Comments)    GI upset   Lipitor [Atorvastatin ] Other (See Comments)    arthralgias   Norvasc  [Amlodipine ] Other (See Comments)    headache   Sulfa Antibiotics Nausea Only   "

## 2024-10-23 ENCOUNTER — Other Ambulatory Visit: Payer: Self-pay

## 2024-10-23 MED ORDER — POTASSIUM CHLORIDE ER 10 MEQ PO TBCR: EXTENDED_RELEASE_TABLET | ORAL | 0 refills | Status: DC

## 2024-10-31 ENCOUNTER — Encounter: Payer: Self-pay | Admitting: Family Medicine

## 2024-10-31 MED ORDER — POTASSIUM CHLORIDE ER 10 MEQ PO TBCR: EXTENDED_RELEASE_TABLET | ORAL | 0 refills | Status: AC

## 2024-11-05 ENCOUNTER — Ambulatory Visit: Admitting: Family Medicine

## 2026-04-14 ENCOUNTER — Ambulatory Visit (HOSPITAL_BASED_OUTPATIENT_CLINIC_OR_DEPARTMENT_OTHER): Admitting: Obstetrics & Gynecology
